# Patient Record
Sex: Male | Born: 1935 | Race: White | Hispanic: No | Marital: Married | Smoking: Former smoker
Health system: Southern US, Community
[De-identification: ages and names within clinical notes are randomized; demographics above are authoritative.]

## PROBLEM LIST (undated history)

## (undated) DIAGNOSIS — F528 Other sexual dysfunction not due to a substance or known physiological condition: Secondary | ICD-10-CM

## (undated) DIAGNOSIS — E039 Hypothyroidism, unspecified: Secondary | ICD-10-CM

## (undated) DIAGNOSIS — K5730 Diverticulosis of large intestine without perforation or abscess without bleeding: Secondary | ICD-10-CM

## (undated) DIAGNOSIS — F329 Major depressive disorder, single episode, unspecified: Secondary | ICD-10-CM

## (undated) DIAGNOSIS — N400 Enlarged prostate without lower urinary tract symptoms: Secondary | ICD-10-CM

## (undated) DIAGNOSIS — M1990 Unspecified osteoarthritis, unspecified site: Secondary | ICD-10-CM

## (undated) DIAGNOSIS — E785 Hyperlipidemia, unspecified: Secondary | ICD-10-CM

## (undated) DIAGNOSIS — Z8679 Personal history of other diseases of the circulatory system: Secondary | ICD-10-CM

## (undated) DIAGNOSIS — H9319 Tinnitus, unspecified ear: Secondary | ICD-10-CM

## (undated) DIAGNOSIS — K219 Gastro-esophageal reflux disease without esophagitis: Secondary | ICD-10-CM

## (undated) DIAGNOSIS — J449 Chronic obstructive pulmonary disease, unspecified: Secondary | ICD-10-CM

## (undated) HISTORY — DX: Other sexual dysfunction not due to a substance or known physiological condition: F52.8

## (undated) HISTORY — DX: Personal history of other diseases of the circulatory system: Z86.79

## (undated) HISTORY — DX: Hyperlipidemia, unspecified: E78.5

## (undated) HISTORY — DX: Hypothyroidism, unspecified: E03.9

## (undated) HISTORY — DX: Diverticulosis of large intestine without perforation or abscess without bleeding: K57.30

## (undated) HISTORY — DX: Benign prostatic hyperplasia without lower urinary tract symptoms: N40.0

## (undated) HISTORY — DX: Tinnitus, unspecified ear: H93.19

## (undated) HISTORY — DX: Gastro-esophageal reflux disease without esophagitis: K21.9

## (undated) HISTORY — DX: Chronic obstructive pulmonary disease, unspecified: J44.9

## (undated) HISTORY — DX: Unspecified osteoarthritis, unspecified site: M19.90

## (undated) HISTORY — DX: Major depressive disorder, single episode, unspecified: F32.9

---

## 2000-04-24 ENCOUNTER — Encounter: Payer: Self-pay | Admitting: Internal Medicine

## 2000-04-24 ENCOUNTER — Encounter: Admission: RE | Admit: 2000-04-24 | Discharge: 2000-04-24 | Payer: Self-pay | Admitting: Internal Medicine

## 2000-05-06 ENCOUNTER — Encounter: Payer: Self-pay | Admitting: Internal Medicine

## 2000-05-06 ENCOUNTER — Encounter: Admission: RE | Admit: 2000-05-06 | Discharge: 2000-05-06 | Payer: Self-pay | Admitting: Internal Medicine

## 2004-03-11 ENCOUNTER — Encounter: Admission: RE | Admit: 2004-03-11 | Discharge: 2004-03-11 | Payer: Self-pay | Admitting: Internal Medicine

## 2005-04-18 ENCOUNTER — Ambulatory Visit (HOSPITAL_BASED_OUTPATIENT_CLINIC_OR_DEPARTMENT_OTHER): Admission: RE | Admit: 2005-04-18 | Discharge: 2005-04-18 | Payer: Self-pay | Admitting: Ophthalmology

## 2005-04-18 ENCOUNTER — Ambulatory Visit (HOSPITAL_COMMUNITY): Admission: RE | Admit: 2005-04-18 | Discharge: 2005-04-18 | Payer: Self-pay | Admitting: Ophthalmology

## 2005-05-06 ENCOUNTER — Ambulatory Visit: Payer: Self-pay | Admitting: Internal Medicine

## 2005-06-19 ENCOUNTER — Ambulatory Visit: Payer: Self-pay | Admitting: Internal Medicine

## 2005-06-19 LAB — CONVERTED CEMR LAB: PSA: 2.63 ng/mL

## 2005-06-25 ENCOUNTER — Ambulatory Visit: Payer: Self-pay | Admitting: Internal Medicine

## 2005-08-01 ENCOUNTER — Ambulatory Visit: Payer: Self-pay | Admitting: Internal Medicine

## 2005-10-23 ENCOUNTER — Ambulatory Visit: Payer: Self-pay | Admitting: Internal Medicine

## 2005-11-28 ENCOUNTER — Ambulatory Visit: Payer: Self-pay | Admitting: Internal Medicine

## 2007-01-09 ENCOUNTER — Encounter: Payer: Self-pay | Admitting: Internal Medicine

## 2007-01-09 DIAGNOSIS — Z8679 Personal history of other diseases of the circulatory system: Secondary | ICD-10-CM | POA: Insufficient documentation

## 2007-01-09 DIAGNOSIS — K5730 Diverticulosis of large intestine without perforation or abscess without bleeding: Secondary | ICD-10-CM | POA: Insufficient documentation

## 2007-01-09 DIAGNOSIS — J4489 Other specified chronic obstructive pulmonary disease: Secondary | ICD-10-CM | POA: Insufficient documentation

## 2007-01-09 DIAGNOSIS — K219 Gastro-esophageal reflux disease without esophagitis: Secondary | ICD-10-CM | POA: Insufficient documentation

## 2007-01-09 DIAGNOSIS — J449 Chronic obstructive pulmonary disease, unspecified: Secondary | ICD-10-CM

## 2007-01-09 DIAGNOSIS — E039 Hypothyroidism, unspecified: Secondary | ICD-10-CM | POA: Insufficient documentation

## 2007-01-09 DIAGNOSIS — H9319 Tinnitus, unspecified ear: Secondary | ICD-10-CM | POA: Insufficient documentation

## 2007-01-09 DIAGNOSIS — F329 Major depressive disorder, single episode, unspecified: Secondary | ICD-10-CM

## 2007-01-09 DIAGNOSIS — E785 Hyperlipidemia, unspecified: Secondary | ICD-10-CM

## 2007-01-09 DIAGNOSIS — M1990 Unspecified osteoarthritis, unspecified site: Secondary | ICD-10-CM

## 2007-01-09 DIAGNOSIS — F528 Other sexual dysfunction not due to a substance or known physiological condition: Secondary | ICD-10-CM

## 2007-01-09 HISTORY — DX: Major depressive disorder, single episode, unspecified: F32.9

## 2007-01-09 HISTORY — DX: Chronic obstructive pulmonary disease, unspecified: J44.9

## 2007-01-09 HISTORY — DX: Diverticulosis of large intestine without perforation or abscess without bleeding: K57.30

## 2007-01-09 HISTORY — DX: Hypothyroidism, unspecified: E03.9

## 2007-01-09 HISTORY — DX: Unspecified osteoarthritis, unspecified site: M19.90

## 2007-01-09 HISTORY — DX: Hyperlipidemia, unspecified: E78.5

## 2007-01-09 HISTORY — DX: Tinnitus, unspecified ear: H93.19

## 2007-01-09 HISTORY — DX: Other sexual dysfunction not due to a substance or known physiological condition: F52.8

## 2007-01-09 HISTORY — DX: Personal history of other diseases of the circulatory system: Z86.79

## 2007-01-09 HISTORY — DX: Gastro-esophageal reflux disease without esophagitis: K21.9

## 2007-01-13 DIAGNOSIS — N400 Enlarged prostate without lower urinary tract symptoms: Secondary | ICD-10-CM

## 2007-01-13 HISTORY — DX: Benign prostatic hyperplasia without lower urinary tract symptoms: N40.0

## 2011-05-13 DIAGNOSIS — I10 Essential (primary) hypertension: Secondary | ICD-10-CM | POA: Diagnosis not present

## 2011-05-13 DIAGNOSIS — E785 Hyperlipidemia, unspecified: Secondary | ICD-10-CM | POA: Diagnosis not present

## 2011-05-27 DIAGNOSIS — G5600 Carpal tunnel syndrome, unspecified upper limb: Secondary | ICD-10-CM | POA: Diagnosis not present

## 2011-05-27 DIAGNOSIS — M5412 Radiculopathy, cervical region: Secondary | ICD-10-CM | POA: Diagnosis not present

## 2011-06-17 DIAGNOSIS — E785 Hyperlipidemia, unspecified: Secondary | ICD-10-CM | POA: Diagnosis not present

## 2011-06-17 DIAGNOSIS — R040 Epistaxis: Secondary | ICD-10-CM | POA: Diagnosis not present

## 2011-06-17 DIAGNOSIS — I10 Essential (primary) hypertension: Secondary | ICD-10-CM | POA: Diagnosis not present

## 2011-06-20 DIAGNOSIS — M5412 Radiculopathy, cervical region: Secondary | ICD-10-CM | POA: Diagnosis not present

## 2011-07-09 DIAGNOSIS — M48061 Spinal stenosis, lumbar region without neurogenic claudication: Secondary | ICD-10-CM | POA: Diagnosis not present

## 2011-07-09 DIAGNOSIS — M5412 Radiculopathy, cervical region: Secondary | ICD-10-CM | POA: Diagnosis not present

## 2011-07-09 DIAGNOSIS — R269 Unspecified abnormalities of gait and mobility: Secondary | ICD-10-CM | POA: Diagnosis not present

## 2011-07-16 DIAGNOSIS — M48061 Spinal stenosis, lumbar region without neurogenic claudication: Secondary | ICD-10-CM | POA: Diagnosis not present

## 2011-07-16 DIAGNOSIS — M4712 Other spondylosis with myelopathy, cervical region: Secondary | ICD-10-CM | POA: Diagnosis not present

## 2011-08-04 ENCOUNTER — Ambulatory Visit: Payer: Medicare Other | Attending: Psychology | Admitting: Physical Therapy

## 2011-08-04 ENCOUNTER — Ambulatory Visit: Payer: Self-pay | Admitting: Physical Therapy

## 2011-08-07 DIAGNOSIS — M4802 Spinal stenosis, cervical region: Secondary | ICD-10-CM | POA: Diagnosis not present

## 2011-08-07 DIAGNOSIS — M899 Disorder of bone, unspecified: Secondary | ICD-10-CM | POA: Diagnosis not present

## 2011-08-07 DIAGNOSIS — Q619 Cystic kidney disease, unspecified: Secondary | ICD-10-CM | POA: Diagnosis not present

## 2011-08-07 DIAGNOSIS — M2550 Pain in unspecified joint: Secondary | ICD-10-CM | POA: Diagnosis not present

## 2011-08-07 DIAGNOSIS — M5030 Other cervical disc degeneration, unspecified cervical region: Secondary | ICD-10-CM | POA: Diagnosis not present

## 2011-08-08 ENCOUNTER — Ambulatory Visit: Payer: Self-pay | Admitting: Physical Therapy

## 2011-08-13 DIAGNOSIS — Q619 Cystic kidney disease, unspecified: Secondary | ICD-10-CM | POA: Diagnosis not present

## 2011-08-19 DIAGNOSIS — E039 Hypothyroidism, unspecified: Secondary | ICD-10-CM | POA: Diagnosis not present

## 2011-08-19 DIAGNOSIS — E785 Hyperlipidemia, unspecified: Secondary | ICD-10-CM | POA: Diagnosis not present

## 2011-08-19 DIAGNOSIS — I10 Essential (primary) hypertension: Secondary | ICD-10-CM | POA: Diagnosis not present

## 2011-08-19 DIAGNOSIS — Z125 Encounter for screening for malignant neoplasm of prostate: Secondary | ICD-10-CM | POA: Diagnosis not present

## 2011-08-20 DIAGNOSIS — H26499 Other secondary cataract, unspecified eye: Secondary | ICD-10-CM | POA: Diagnosis not present

## 2011-08-26 DIAGNOSIS — Z0000 Encounter for general adult medical examination without abnormal findings: Secondary | ICD-10-CM | POA: Diagnosis not present

## 2011-08-26 DIAGNOSIS — E785 Hyperlipidemia, unspecified: Secondary | ICD-10-CM | POA: Diagnosis not present

## 2011-08-26 DIAGNOSIS — I10 Essential (primary) hypertension: Secondary | ICD-10-CM | POA: Diagnosis not present

## 2011-08-26 DIAGNOSIS — Z1212 Encounter for screening for malignant neoplasm of rectum: Secondary | ICD-10-CM | POA: Diagnosis not present

## 2011-08-26 DIAGNOSIS — E039 Hypothyroidism, unspecified: Secondary | ICD-10-CM | POA: Diagnosis not present

## 2011-08-26 DIAGNOSIS — Z125 Encounter for screening for malignant neoplasm of prostate: Secondary | ICD-10-CM | POA: Diagnosis not present

## 2011-09-16 ENCOUNTER — Ambulatory Visit: Payer: Medicare Other | Attending: Internal Medicine | Admitting: Rehabilitative and Restorative Service Providers"

## 2011-09-16 DIAGNOSIS — R269 Unspecified abnormalities of gait and mobility: Secondary | ICD-10-CM | POA: Insufficient documentation

## 2011-09-16 DIAGNOSIS — IMO0001 Reserved for inherently not codable concepts without codable children: Secondary | ICD-10-CM | POA: Insufficient documentation

## 2011-09-16 DIAGNOSIS — M25559 Pain in unspecified hip: Secondary | ICD-10-CM | POA: Insufficient documentation

## 2011-09-16 DIAGNOSIS — M79609 Pain in unspecified limb: Secondary | ICD-10-CM | POA: Diagnosis not present

## 2011-09-22 ENCOUNTER — Encounter: Payer: No Typology Code available for payment source | Admitting: Rehabilitative and Restorative Service Providers"

## 2011-09-23 ENCOUNTER — Encounter: Payer: No Typology Code available for payment source | Admitting: Rehabilitative and Restorative Service Providers"

## 2011-09-24 DIAGNOSIS — H26499 Other secondary cataract, unspecified eye: Secondary | ICD-10-CM | POA: Diagnosis not present

## 2011-09-24 DIAGNOSIS — H5210 Myopia, unspecified eye: Secondary | ICD-10-CM | POA: Diagnosis not present

## 2011-09-24 DIAGNOSIS — H52209 Unspecified astigmatism, unspecified eye: Secondary | ICD-10-CM | POA: Diagnosis not present

## 2011-09-26 ENCOUNTER — Encounter: Payer: No Typology Code available for payment source | Admitting: Rehabilitative and Restorative Service Providers"

## 2011-10-01 ENCOUNTER — Ambulatory Visit: Payer: Medicare Other | Admitting: Rehabilitative and Restorative Service Providers"

## 2011-10-03 ENCOUNTER — Ambulatory Visit: Payer: Medicare Other | Admitting: Rehabilitative and Restorative Service Providers"

## 2011-10-07 ENCOUNTER — Ambulatory Visit: Payer: Medicare Other | Attending: Internal Medicine | Admitting: Rehabilitative and Restorative Service Providers"

## 2011-10-07 DIAGNOSIS — IMO0001 Reserved for inherently not codable concepts without codable children: Secondary | ICD-10-CM | POA: Diagnosis not present

## 2011-10-07 DIAGNOSIS — R269 Unspecified abnormalities of gait and mobility: Secondary | ICD-10-CM | POA: Diagnosis not present

## 2011-10-07 DIAGNOSIS — M25559 Pain in unspecified hip: Secondary | ICD-10-CM | POA: Insufficient documentation

## 2011-10-07 DIAGNOSIS — M79609 Pain in unspecified limb: Secondary | ICD-10-CM | POA: Insufficient documentation

## 2011-10-09 ENCOUNTER — Ambulatory Visit: Payer: Medicare Other | Admitting: Rehabilitative and Restorative Service Providers"

## 2011-10-14 ENCOUNTER — Ambulatory Visit: Payer: Medicare Other | Admitting: Rehabilitative and Restorative Service Providers"

## 2011-10-17 ENCOUNTER — Encounter: Payer: No Typology Code available for payment source | Admitting: Rehabilitative and Restorative Service Providers"

## 2011-10-20 ENCOUNTER — Other Ambulatory Visit: Payer: Self-pay | Admitting: Internal Medicine

## 2011-10-20 DIAGNOSIS — R935 Abnormal findings on diagnostic imaging of other abdominal regions, including retroperitoneum: Secondary | ICD-10-CM

## 2011-10-24 ENCOUNTER — Ambulatory Visit: Payer: Medicare Other | Admitting: Rehabilitative and Restorative Service Providers"

## 2011-10-28 ENCOUNTER — Ambulatory Visit: Payer: Medicare Other | Admitting: Occupational Therapy

## 2011-11-10 ENCOUNTER — Encounter: Payer: No Typology Code available for payment source | Admitting: Occupational Therapy

## 2011-11-12 ENCOUNTER — Encounter: Payer: No Typology Code available for payment source | Admitting: Occupational Therapy

## 2011-11-19 DIAGNOSIS — R209 Unspecified disturbances of skin sensation: Secondary | ICD-10-CM | POA: Diagnosis not present

## 2011-11-19 DIAGNOSIS — I10 Essential (primary) hypertension: Secondary | ICD-10-CM | POA: Diagnosis not present

## 2011-12-03 DIAGNOSIS — H269 Unspecified cataract: Secondary | ICD-10-CM | POA: Diagnosis not present

## 2011-12-03 DIAGNOSIS — F329 Major depressive disorder, single episode, unspecified: Secondary | ICD-10-CM | POA: Diagnosis not present

## 2011-12-03 DIAGNOSIS — I10 Essential (primary) hypertension: Secondary | ICD-10-CM | POA: Diagnosis not present

## 2011-12-03 DIAGNOSIS — E039 Hypothyroidism, unspecified: Secondary | ICD-10-CM | POA: Diagnosis not present

## 2011-12-03 DIAGNOSIS — K219 Gastro-esophageal reflux disease without esophagitis: Secondary | ICD-10-CM | POA: Diagnosis not present

## 2011-12-03 DIAGNOSIS — E785 Hyperlipidemia, unspecified: Secondary | ICD-10-CM | POA: Diagnosis not present

## 2011-12-03 DIAGNOSIS — R209 Unspecified disturbances of skin sensation: Secondary | ICD-10-CM | POA: Diagnosis not present

## 2011-12-24 ENCOUNTER — Ambulatory Visit: Payer: Medicare Other | Attending: Internal Medicine | Admitting: Rehabilitative and Restorative Service Providers"

## 2011-12-24 DIAGNOSIS — IMO0001 Reserved for inherently not codable concepts without codable children: Secondary | ICD-10-CM | POA: Diagnosis not present

## 2011-12-24 DIAGNOSIS — M79609 Pain in unspecified limb: Secondary | ICD-10-CM | POA: Insufficient documentation

## 2011-12-24 DIAGNOSIS — M25559 Pain in unspecified hip: Secondary | ICD-10-CM | POA: Diagnosis not present

## 2011-12-24 DIAGNOSIS — R269 Unspecified abnormalities of gait and mobility: Secondary | ICD-10-CM | POA: Diagnosis not present

## 2011-12-31 ENCOUNTER — Ambulatory Visit: Payer: Medicare Other | Admitting: Rehabilitation

## 2012-01-07 ENCOUNTER — Ambulatory Visit: Payer: Medicare Other | Attending: Internal Medicine | Admitting: Rehabilitative and Restorative Service Providers"

## 2012-01-07 DIAGNOSIS — M542 Cervicalgia: Secondary | ICD-10-CM | POA: Diagnosis not present

## 2012-01-07 DIAGNOSIS — IMO0001 Reserved for inherently not codable concepts without codable children: Secondary | ICD-10-CM | POA: Diagnosis not present

## 2012-01-07 DIAGNOSIS — M2569 Stiffness of other specified joint, not elsewhere classified: Secondary | ICD-10-CM | POA: Insufficient documentation

## 2012-01-07 DIAGNOSIS — R293 Abnormal posture: Secondary | ICD-10-CM | POA: Insufficient documentation

## 2012-01-08 ENCOUNTER — Ambulatory Visit: Payer: Medicare Other | Admitting: Rehabilitative and Restorative Service Providers"

## 2012-01-08 DIAGNOSIS — IMO0001 Reserved for inherently not codable concepts without codable children: Secondary | ICD-10-CM | POA: Diagnosis not present

## 2012-01-08 DIAGNOSIS — M542 Cervicalgia: Secondary | ICD-10-CM | POA: Diagnosis not present

## 2012-01-08 DIAGNOSIS — R293 Abnormal posture: Secondary | ICD-10-CM | POA: Diagnosis not present

## 2012-01-13 ENCOUNTER — Ambulatory Visit: Payer: Medicare Other | Admitting: Physical Therapy

## 2012-01-13 DIAGNOSIS — IMO0001 Reserved for inherently not codable concepts without codable children: Secondary | ICD-10-CM | POA: Diagnosis not present

## 2012-01-13 DIAGNOSIS — M542 Cervicalgia: Secondary | ICD-10-CM | POA: Diagnosis not present

## 2012-01-13 DIAGNOSIS — R293 Abnormal posture: Secondary | ICD-10-CM | POA: Diagnosis not present

## 2012-01-15 ENCOUNTER — Ambulatory Visit: Payer: Medicare Other | Admitting: Rehabilitation

## 2012-01-15 DIAGNOSIS — R293 Abnormal posture: Secondary | ICD-10-CM | POA: Diagnosis not present

## 2012-01-15 DIAGNOSIS — M542 Cervicalgia: Secondary | ICD-10-CM | POA: Diagnosis not present

## 2012-01-15 DIAGNOSIS — IMO0001 Reserved for inherently not codable concepts without codable children: Secondary | ICD-10-CM | POA: Diagnosis not present

## 2012-01-20 ENCOUNTER — Ambulatory Visit: Payer: Medicare Other | Admitting: Rehabilitation

## 2012-01-20 DIAGNOSIS — M542 Cervicalgia: Secondary | ICD-10-CM | POA: Diagnosis not present

## 2012-01-20 DIAGNOSIS — IMO0001 Reserved for inherently not codable concepts without codable children: Secondary | ICD-10-CM | POA: Diagnosis not present

## 2012-01-20 DIAGNOSIS — R293 Abnormal posture: Secondary | ICD-10-CM | POA: Diagnosis not present

## 2012-01-22 ENCOUNTER — Ambulatory Visit: Payer: Medicare Other

## 2012-01-22 DIAGNOSIS — R293 Abnormal posture: Secondary | ICD-10-CM | POA: Diagnosis not present

## 2012-01-22 DIAGNOSIS — IMO0001 Reserved for inherently not codable concepts without codable children: Secondary | ICD-10-CM | POA: Diagnosis not present

## 2012-01-22 DIAGNOSIS — M542 Cervicalgia: Secondary | ICD-10-CM | POA: Diagnosis not present

## 2012-02-02 ENCOUNTER — Ambulatory Visit: Payer: Medicare Other | Admitting: Rehabilitative and Restorative Service Providers"

## 2012-02-02 DIAGNOSIS — M2569 Stiffness of other specified joint, not elsewhere classified: Secondary | ICD-10-CM | POA: Diagnosis not present

## 2012-02-02 DIAGNOSIS — IMO0001 Reserved for inherently not codable concepts without codable children: Secondary | ICD-10-CM | POA: Diagnosis not present

## 2012-02-02 DIAGNOSIS — R293 Abnormal posture: Secondary | ICD-10-CM | POA: Diagnosis not present

## 2012-02-02 DIAGNOSIS — M542 Cervicalgia: Secondary | ICD-10-CM | POA: Diagnosis not present

## 2012-02-04 ENCOUNTER — Encounter: Payer: No Typology Code available for payment source | Admitting: Rehabilitation

## 2012-02-05 DIAGNOSIS — G5600 Carpal tunnel syndrome, unspecified upper limb: Secondary | ICD-10-CM | POA: Diagnosis not present

## 2012-02-17 DIAGNOSIS — M5412 Radiculopathy, cervical region: Secondary | ICD-10-CM | POA: Diagnosis not present

## 2012-02-17 DIAGNOSIS — G5600 Carpal tunnel syndrome, unspecified upper limb: Secondary | ICD-10-CM | POA: Diagnosis not present

## 2012-03-03 DIAGNOSIS — I10 Essential (primary) hypertension: Secondary | ICD-10-CM | POA: Diagnosis not present

## 2012-06-18 DIAGNOSIS — Z961 Presence of intraocular lens: Secondary | ICD-10-CM | POA: Diagnosis not present

## 2012-06-18 DIAGNOSIS — H40059 Ocular hypertension, unspecified eye: Secondary | ICD-10-CM | POA: Diagnosis not present

## 2012-06-18 DIAGNOSIS — H53009 Unspecified amblyopia, unspecified eye: Secondary | ICD-10-CM | POA: Diagnosis not present

## 2012-06-18 DIAGNOSIS — H52209 Unspecified astigmatism, unspecified eye: Secondary | ICD-10-CM | POA: Diagnosis not present

## 2012-08-02 DIAGNOSIS — H4011X0 Primary open-angle glaucoma, stage unspecified: Secondary | ICD-10-CM | POA: Diagnosis not present

## 2012-08-02 DIAGNOSIS — H409 Unspecified glaucoma: Secondary | ICD-10-CM | POA: Diagnosis not present

## 2012-08-02 DIAGNOSIS — H4720 Unspecified optic atrophy: Secondary | ICD-10-CM | POA: Diagnosis not present

## 2012-10-28 ENCOUNTER — Ambulatory Visit: Payer: Medicare Other | Attending: *Deleted | Admitting: Physical Therapy

## 2012-10-28 DIAGNOSIS — IMO0001 Reserved for inherently not codable concepts without codable children: Secondary | ICD-10-CM | POA: Insufficient documentation

## 2012-10-28 DIAGNOSIS — M25559 Pain in unspecified hip: Secondary | ICD-10-CM | POA: Diagnosis not present

## 2012-11-08 DIAGNOSIS — H35349 Macular cyst, hole, or pseudohole, unspecified eye: Secondary | ICD-10-CM | POA: Diagnosis not present

## 2012-11-08 DIAGNOSIS — H4011X0 Primary open-angle glaucoma, stage unspecified: Secondary | ICD-10-CM | POA: Diagnosis not present

## 2012-11-08 DIAGNOSIS — H4720 Unspecified optic atrophy: Secondary | ICD-10-CM | POA: Diagnosis not present

## 2012-11-08 DIAGNOSIS — H409 Unspecified glaucoma: Secondary | ICD-10-CM | POA: Diagnosis not present

## 2012-11-08 DIAGNOSIS — H53419 Scotoma involving central area, unspecified eye: Secondary | ICD-10-CM | POA: Diagnosis not present

## 2012-11-10 ENCOUNTER — Ambulatory Visit: Payer: Non-veteran care | Attending: *Deleted | Admitting: Physical Therapy

## 2012-11-10 DIAGNOSIS — M25559 Pain in unspecified hip: Secondary | ICD-10-CM | POA: Diagnosis not present

## 2012-11-10 DIAGNOSIS — IMO0001 Reserved for inherently not codable concepts without codable children: Secondary | ICD-10-CM | POA: Diagnosis not present

## 2012-11-12 ENCOUNTER — Ambulatory Visit: Payer: Non-veteran care | Admitting: Physical Therapy

## 2012-11-12 DIAGNOSIS — IMO0001 Reserved for inherently not codable concepts without codable children: Secondary | ICD-10-CM | POA: Diagnosis not present

## 2012-11-15 ENCOUNTER — Ambulatory Visit: Payer: Non-veteran care | Admitting: Physical Therapy

## 2012-11-15 DIAGNOSIS — IMO0001 Reserved for inherently not codable concepts without codable children: Secondary | ICD-10-CM | POA: Diagnosis not present

## 2012-11-18 ENCOUNTER — Ambulatory Visit: Payer: Non-veteran care | Admitting: Physical Therapy

## 2012-11-18 DIAGNOSIS — IMO0001 Reserved for inherently not codable concepts without codable children: Secondary | ICD-10-CM | POA: Diagnosis not present

## 2012-11-24 ENCOUNTER — Ambulatory Visit: Payer: Non-veteran care | Admitting: Physical Therapy

## 2012-11-30 ENCOUNTER — Ambulatory Visit: Payer: Non-veteran care | Admitting: Physical Therapy

## 2012-11-30 DIAGNOSIS — IMO0001 Reserved for inherently not codable concepts without codable children: Secondary | ICD-10-CM | POA: Diagnosis not present

## 2012-12-02 ENCOUNTER — Ambulatory Visit: Payer: Non-veteran care | Admitting: Physical Therapy

## 2012-12-03 DIAGNOSIS — H4720 Unspecified optic atrophy: Secondary | ICD-10-CM | POA: Diagnosis not present

## 2012-12-06 DIAGNOSIS — H35349 Macular cyst, hole, or pseudohole, unspecified eye: Secondary | ICD-10-CM | POA: Diagnosis not present

## 2012-12-06 DIAGNOSIS — H4720 Unspecified optic atrophy: Secondary | ICD-10-CM | POA: Diagnosis not present

## 2012-12-15 ENCOUNTER — Ambulatory Visit: Payer: Medicare Other | Attending: *Deleted | Admitting: Physical Therapy

## 2012-12-15 DIAGNOSIS — M25559 Pain in unspecified hip: Secondary | ICD-10-CM | POA: Insufficient documentation

## 2012-12-15 DIAGNOSIS — IMO0001 Reserved for inherently not codable concepts without codable children: Secondary | ICD-10-CM | POA: Insufficient documentation

## 2013-02-11 DIAGNOSIS — F329 Major depressive disorder, single episode, unspecified: Secondary | ICD-10-CM | POA: Diagnosis not present

## 2013-02-11 DIAGNOSIS — Z0000 Encounter for general adult medical examination without abnormal findings: Secondary | ICD-10-CM | POA: Diagnosis not present

## 2013-02-11 DIAGNOSIS — M899 Disorder of bone, unspecified: Secondary | ICD-10-CM | POA: Diagnosis not present

## 2013-02-11 DIAGNOSIS — M25559 Pain in unspecified hip: Secondary | ICD-10-CM | POA: Diagnosis not present

## 2013-02-11 DIAGNOSIS — Z1331 Encounter for screening for depression: Secondary | ICD-10-CM | POA: Diagnosis not present

## 2013-02-11 DIAGNOSIS — E039 Hypothyroidism, unspecified: Secondary | ICD-10-CM | POA: Diagnosis not present

## 2013-02-11 DIAGNOSIS — I10 Essential (primary) hypertension: Secondary | ICD-10-CM | POA: Diagnosis not present

## 2013-02-11 DIAGNOSIS — K219 Gastro-esophageal reflux disease without esophagitis: Secondary | ICD-10-CM | POA: Diagnosis not present

## 2013-02-21 ENCOUNTER — Ambulatory Visit: Payer: Non-veteran care | Admitting: Cardiology

## 2013-03-07 ENCOUNTER — Encounter: Payer: Self-pay | Admitting: *Deleted

## 2013-03-09 ENCOUNTER — Encounter: Payer: Self-pay | Admitting: Cardiology

## 2013-03-09 ENCOUNTER — Ambulatory Visit (INDEPENDENT_AMBULATORY_CARE_PROVIDER_SITE_OTHER): Payer: Medicare Other | Admitting: Cardiology

## 2013-03-09 VITALS — BP 142/84 | HR 57 | Ht 69.0 in | Wt 166.0 lb

## 2013-03-09 DIAGNOSIS — R002 Palpitations: Secondary | ICD-10-CM | POA: Diagnosis not present

## 2013-03-09 NOTE — Progress Notes (Signed)
Patient ID: KIETH HARTIS, male   DOB: January 21, 1934, 79 y.o.   MRN: [id number]    Patient Name: Peter Velasquez Date of Encounter: 03/09/2013  Primary Care Provider:  No primary provider on file. Primary Cardiologist:  Tobias Alexander, H  Patient Profile  Abnormal ECG  Problem List   Past Medical History  Diagnosis Date  . HYPERLIPIDEMIA 01/09/2007    Qualifier: Diagnosis of  By: Maris Berger   . BENIGN PROSTATIC HYPERTROPHY 01/13/2007    Qualifier: Diagnosis of  By: Jonny Ruiz MD, Len Blalock   . COPD 01/09/2007    Qualifier: Diagnosis of  By: Maris Berger   . DEGENERATIVE JOINT DISEASE 01/09/2007    Qualifier: Diagnosis of  By: Maris Berger   . DEPRESSION 01/09/2007    Qualifier: Diagnosis of  By: Maris Berger   . DIVERTICULOSIS, COLON 01/09/2007    Qualifier: Diagnosis of  By: Maris Berger   . ERECTILE DYSFUNCTION 01/09/2007    Qualifier: Diagnosis of  By: Maris Berger   . GERD 01/09/2007    Qualifier: Diagnosis of  By: Maris Berger   . HYPOTHYROIDISM 01/09/2007    Qualifier: Diagnosis of  By: Maris Berger   . PALPITATIONS, HX OF 01/09/2007    Qualifier: Diagnosis of  By: Maris Berger   . TINNITUS 01/09/2007    Qualifier: Diagnosis of  By: Maris Berger    History reviewed. No pertinent past surgical history.  Allergies  No Known Allergies  HPI  A very pleasant 79 year old male, formerly in [employer], with h/o HTN, hyperlipidemia, who was referred by his PCP for a concern of abnormal ECG. The patient is physically active, he used to walk his dog for about a mile without any limitations. He denies any chest pain, SOB, palpitations or syncope. He does have a family h/o CAD, his brother had 2 stents placed 5 years ago.   Home Medications  Prior to Admission medications   Medication Sig Start Date End Date Taking? Authorizing Provider  atenolol (TENORMIN) 50 MG tablet Take 50 mg by mouth daily.    Yes Historical Provider, MD  benazepril (LOTENSIN) 20 MG tablet Take 20 mg by mouth daily.   Yes Historical Provider, MD  citalopram (CELEXA) 20 MG tablet Take 20 mg by mouth daily.   Yes Historical Provider, MD  levothyroxine (SYNTHROID, LEVOTHROID) 137 MCG tablet Take 137 mcg by mouth daily before breakfast.   Yes Historical Provider, MD  omeprazole (PRILOSEC) 20 MG capsule Take 20 mg by mouth daily.   Yes Historical Provider, MD  simvastatin (ZOCOR) 40 MG tablet Take 40 mg by mouth daily.   Yes Historical Provider, MD    Family History  Family History  Problem Relation Age of Onset  . Stroke Father   . Other Sister   . Hypertension Father     Social History  History   Social History  . Marital Status: Married    Spouse Name: N/A    Number of Children: N/A  . Years of Education: N/A   Occupational History  . Not on file.   Social History Main Topics  . Smoking status: Former Smoker    Quit date: 01/23/1998  . Smokeless tobacco: Not on file  . Alcohol Use: No  . Drug Use: No  . Sexual Activity: Not on file   Other Topics Concern  . Not on file   Social History Narrative  . No narrative on  file     Review of Systems, as per HPI, otherwise negative General:  No chills, fever, night sweats or weight changes.  Cardiovascular:  No chest pain, dyspnea on exertion, edema, orthopnea, palpitations, paroxysmal nocturnal dyspnea. Dermatological: No rash, lesions/masses Respiratory: No cough, dyspnea Urologic: No hematuria, dysuria Abdominal:   No nausea, vomiting, diarrhea, bright red blood per rectum, melena, or hematemesis Neurologic:  No visual changes, wkns, changes in mental status. All other systems reviewed and are otherwise negative except as noted above.  Physical Exam  Blood pressure 142/84, pulse 57, height 5\' 9"  (1.753 m), weight 166 lb (75.297 kg).  General: Pleasant, NAD Psych: Normal affect. Neuro: Alert and oriented X 3. Moves all extremities  spontaneously. HEENT: Normal  Neck: Supple without bruits or JVD. Lungs:  Resp regular and unlabored, CTA. Heart: RRR no s3, s4, or murmurs. Abdomen: Soft, non-tender, non-distended, BS + x 4.  Extremities: No clubbing, cyanosis or edema. DP/PT/Radials 2+ and equal bilaterally.  Accessory Clinical Findings  ECG - sinus bradycardia, 67 beats per minute, otherwise normal EKG.  Assessment & Plan  79 year old gentleman referred to [place] for an abnormal ECG. The ECG in fact is completely normal. The patient is active and asymptomatic. He has risk factors that are well controlled by his PCP, icluding HTN and hyperlipidemia. Physical exam is normal. Today's BP was borderline, however the patient states that it is almost always normal when checked at home. We won't do anyt changes. There is no reason to proceed with any further workup at this point..   Thank you for referring this patient to [place]. The patient was advised to contact us if any new symptoms occur.    Tobias Alexander, Rexene Edison, MD 03/09/2013, 3:28 PM

## 2013-03-09 NOTE — Patient Instructions (Signed)
**Note De-identified Timia Casselman Obfuscation** Your physician recommends that you continue on your current medications as directed. Please refer to the Current Medication list given to you today.  Your physician recommends that you schedule a follow-up appointment in: as needed  

## 2013-03-10 DIAGNOSIS — Z23 Encounter for immunization: Secondary | ICD-10-CM | POA: Diagnosis not present

## 2013-03-30 DIAGNOSIS — IMO0002 Reserved for concepts with insufficient information to code with codable children: Secondary | ICD-10-CM | POA: Diagnosis not present

## 2013-03-30 DIAGNOSIS — S335XXA Sprain of ligaments of lumbar spine, initial encounter: Secondary | ICD-10-CM | POA: Diagnosis not present

## 2013-05-19 DIAGNOSIS — IMO0002 Reserved for concepts with insufficient information to code with codable children: Secondary | ICD-10-CM | POA: Diagnosis not present

## 2013-07-13 DIAGNOSIS — G5600 Carpal tunnel syndrome, unspecified upper limb: Secondary | ICD-10-CM | POA: Diagnosis not present

## 2013-07-13 DIAGNOSIS — IMO0002 Reserved for concepts with insufficient information to code with codable children: Secondary | ICD-10-CM | POA: Diagnosis not present

## 2013-07-13 DIAGNOSIS — M19049 Primary osteoarthritis, unspecified hand: Secondary | ICD-10-CM | POA: Diagnosis not present

## 2013-07-13 DIAGNOSIS — M79609 Pain in unspecified limb: Secondary | ICD-10-CM | POA: Diagnosis not present

## 2013-07-23 DIAGNOSIS — IMO0002 Reserved for concepts with insufficient information to code with codable children: Secondary | ICD-10-CM | POA: Diagnosis not present

## 2013-07-27 DIAGNOSIS — M76899 Other specified enthesopathies of unspecified lower limb, excluding foot: Secondary | ICD-10-CM | POA: Diagnosis not present

## 2013-07-29 DIAGNOSIS — M76899 Other specified enthesopathies of unspecified lower limb, excluding foot: Secondary | ICD-10-CM | POA: Diagnosis not present

## 2013-07-30 DIAGNOSIS — M25539 Pain in unspecified wrist: Secondary | ICD-10-CM | POA: Diagnosis not present

## 2013-08-12 DIAGNOSIS — F329 Major depressive disorder, single episode, unspecified: Secondary | ICD-10-CM | POA: Diagnosis not present

## 2013-08-12 DIAGNOSIS — K219 Gastro-esophageal reflux disease without esophagitis: Secondary | ICD-10-CM | POA: Diagnosis not present

## 2013-08-12 DIAGNOSIS — N182 Chronic kidney disease, stage 2 (mild): Secondary | ICD-10-CM | POA: Diagnosis not present

## 2013-08-12 DIAGNOSIS — I10 Essential (primary) hypertension: Secondary | ICD-10-CM | POA: Diagnosis not present

## 2013-08-12 DIAGNOSIS — E782 Mixed hyperlipidemia: Secondary | ICD-10-CM | POA: Diagnosis not present

## 2013-08-12 DIAGNOSIS — E039 Hypothyroidism, unspecified: Secondary | ICD-10-CM | POA: Diagnosis not present

## 2013-08-19 DIAGNOSIS — M25559 Pain in unspecified hip: Secondary | ICD-10-CM | POA: Diagnosis not present

## 2013-08-19 DIAGNOSIS — M76899 Other specified enthesopathies of unspecified lower limb, excluding foot: Secondary | ICD-10-CM | POA: Diagnosis not present

## 2013-09-12 DIAGNOSIS — M6580 Other synovitis and tenosynovitis, unspecified site: Secondary | ICD-10-CM | POA: Diagnosis not present

## 2013-09-12 DIAGNOSIS — G5600 Carpal tunnel syndrome, unspecified upper limb: Secondary | ICD-10-CM | POA: Diagnosis not present

## 2013-09-12 DIAGNOSIS — M19049 Primary osteoarthritis, unspecified hand: Secondary | ICD-10-CM | POA: Diagnosis not present

## 2013-09-12 DIAGNOSIS — M79609 Pain in unspecified limb: Secondary | ICD-10-CM | POA: Diagnosis not present

## 2013-10-03 DIAGNOSIS — M48061 Spinal stenosis, lumbar region without neurogenic claudication: Secondary | ICD-10-CM | POA: Diagnosis not present

## 2013-10-03 DIAGNOSIS — IMO0002 Reserved for concepts with insufficient information to code with codable children: Secondary | ICD-10-CM | POA: Diagnosis not present

## 2013-10-24 DIAGNOSIS — M1990 Unspecified osteoarthritis, unspecified site: Secondary | ICD-10-CM | POA: Diagnosis not present

## 2013-10-24 DIAGNOSIS — M79609 Pain in unspecified limb: Secondary | ICD-10-CM | POA: Diagnosis not present

## 2013-10-24 DIAGNOSIS — IMO0002 Reserved for concepts with insufficient information to code with codable children: Secondary | ICD-10-CM | POA: Diagnosis not present

## 2013-10-24 DIAGNOSIS — M48061 Spinal stenosis, lumbar region without neurogenic claudication: Secondary | ICD-10-CM | POA: Diagnosis not present

## 2013-11-02 DIAGNOSIS — M545 Low back pain, unspecified: Secondary | ICD-10-CM | POA: Diagnosis not present

## 2013-11-15 DIAGNOSIS — IMO0002 Reserved for concepts with insufficient information to code with codable children: Secondary | ICD-10-CM | POA: Diagnosis not present

## 2013-11-15 DIAGNOSIS — M48061 Spinal stenosis, lumbar region without neurogenic claudication: Secondary | ICD-10-CM | POA: Diagnosis not present

## 2013-12-13 DIAGNOSIS — IMO0002 Reserved for concepts with insufficient information to code with codable children: Secondary | ICD-10-CM | POA: Diagnosis not present

## 2013-12-26 DIAGNOSIS — M48061 Spinal stenosis, lumbar region without neurogenic claudication: Secondary | ICD-10-CM | POA: Diagnosis not present

## 2013-12-26 DIAGNOSIS — IMO0002 Reserved for concepts with insufficient information to code with codable children: Secondary | ICD-10-CM | POA: Diagnosis not present

## 2014-02-06 DIAGNOSIS — M1812 Unilateral primary osteoarthritis of first carpometacarpal joint, left hand: Secondary | ICD-10-CM | POA: Diagnosis not present

## 2014-02-06 DIAGNOSIS — G5602 Carpal tunnel syndrome, left upper limb: Secondary | ICD-10-CM | POA: Diagnosis not present

## 2014-02-15 DIAGNOSIS — N400 Enlarged prostate without lower urinary tract symptoms: Secondary | ICD-10-CM | POA: Diagnosis not present

## 2014-02-15 DIAGNOSIS — K219 Gastro-esophageal reflux disease without esophagitis: Secondary | ICD-10-CM | POA: Diagnosis not present

## 2014-02-15 DIAGNOSIS — E039 Hypothyroidism, unspecified: Secondary | ICD-10-CM | POA: Diagnosis not present

## 2014-02-15 DIAGNOSIS — E780 Pure hypercholesterolemia: Secondary | ICD-10-CM | POA: Diagnosis not present

## 2014-02-15 DIAGNOSIS — N183 Chronic kidney disease, stage 3 (moderate): Secondary | ICD-10-CM | POA: Diagnosis not present

## 2014-02-15 DIAGNOSIS — Z1389 Encounter for screening for other disorder: Secondary | ICD-10-CM | POA: Diagnosis not present

## 2014-02-15 DIAGNOSIS — Z23 Encounter for immunization: Secondary | ICD-10-CM | POA: Diagnosis not present

## 2014-02-15 DIAGNOSIS — F3342 Major depressive disorder, recurrent, in full remission: Secondary | ICD-10-CM | POA: Diagnosis not present

## 2014-02-15 DIAGNOSIS — Z0001 Encounter for general adult medical examination with abnormal findings: Secondary | ICD-10-CM | POA: Diagnosis not present

## 2014-02-15 DIAGNOSIS — I10 Essential (primary) hypertension: Secondary | ICD-10-CM | POA: Diagnosis not present

## 2014-02-15 DIAGNOSIS — K580 Irritable bowel syndrome with diarrhea: Secondary | ICD-10-CM | POA: Diagnosis not present

## 2014-03-20 DIAGNOSIS — G5602 Carpal tunnel syndrome, left upper limb: Secondary | ICD-10-CM | POA: Diagnosis not present

## 2014-03-22 DIAGNOSIS — E039 Hypothyroidism, unspecified: Secondary | ICD-10-CM | POA: Diagnosis not present

## 2014-04-10 DIAGNOSIS — Z1211 Encounter for screening for malignant neoplasm of colon: Secondary | ICD-10-CM | POA: Diagnosis not present

## 2014-04-11 DIAGNOSIS — I10 Essential (primary) hypertension: Secondary | ICD-10-CM | POA: Diagnosis not present

## 2014-04-11 DIAGNOSIS — H409 Unspecified glaucoma: Secondary | ICD-10-CM | POA: Diagnosis not present

## 2014-04-11 DIAGNOSIS — G5602 Carpal tunnel syndrome, left upper limb: Secondary | ICD-10-CM | POA: Diagnosis not present

## 2014-04-11 DIAGNOSIS — G5601 Carpal tunnel syndrome, right upper limb: Secondary | ICD-10-CM | POA: Diagnosis not present

## 2014-04-11 DIAGNOSIS — M1990 Unspecified osteoarthritis, unspecified site: Secondary | ICD-10-CM | POA: Diagnosis not present

## 2014-04-26 DIAGNOSIS — Z48811 Encounter for surgical aftercare following surgery on the nervous system: Secondary | ICD-10-CM | POA: Diagnosis not present

## 2014-06-26 DIAGNOSIS — G5602 Carpal tunnel syndrome, left upper limb: Secondary | ICD-10-CM | POA: Diagnosis not present

## 2014-06-26 DIAGNOSIS — Z48811 Encounter for surgical aftercare following surgery on the nervous system: Secondary | ICD-10-CM | POA: Diagnosis not present

## 2014-06-26 DIAGNOSIS — M1812 Unilateral primary osteoarthritis of first carpometacarpal joint, left hand: Secondary | ICD-10-CM | POA: Diagnosis not present

## 2014-08-21 DIAGNOSIS — J449 Chronic obstructive pulmonary disease, unspecified: Secondary | ICD-10-CM | POA: Diagnosis not present

## 2014-08-21 DIAGNOSIS — E039 Hypothyroidism, unspecified: Secondary | ICD-10-CM | POA: Diagnosis not present

## 2014-08-21 DIAGNOSIS — E780 Pure hypercholesterolemia: Secondary | ICD-10-CM | POA: Diagnosis not present

## 2014-08-21 DIAGNOSIS — F3342 Major depressive disorder, recurrent, in full remission: Secondary | ICD-10-CM | POA: Diagnosis not present

## 2014-08-21 DIAGNOSIS — R202 Paresthesia of skin: Secondary | ICD-10-CM | POA: Diagnosis not present

## 2014-08-21 DIAGNOSIS — N183 Chronic kidney disease, stage 3 (moderate): Secondary | ICD-10-CM | POA: Diagnosis not present

## 2014-08-21 DIAGNOSIS — I10 Essential (primary) hypertension: Secondary | ICD-10-CM | POA: Diagnosis not present

## 2014-09-07 DIAGNOSIS — J449 Chronic obstructive pulmonary disease, unspecified: Secondary | ICD-10-CM | POA: Diagnosis not present

## 2015-01-04 DIAGNOSIS — R208 Other disturbances of skin sensation: Secondary | ICD-10-CM | POA: Diagnosis not present

## 2015-01-18 DIAGNOSIS — L821 Other seborrheic keratosis: Secondary | ICD-10-CM | POA: Diagnosis not present

## 2015-01-18 DIAGNOSIS — G548 Other nerve root and plexus disorders: Secondary | ICD-10-CM | POA: Diagnosis not present

## 2015-01-18 DIAGNOSIS — D225 Melanocytic nevi of trunk: Secondary | ICD-10-CM | POA: Diagnosis not present

## 2015-01-18 DIAGNOSIS — L309 Dermatitis, unspecified: Secondary | ICD-10-CM | POA: Diagnosis not present

## 2015-01-29 ENCOUNTER — Ambulatory Visit
Admission: RE | Admit: 2015-01-29 | Discharge: 2015-01-29 | Disposition: A | Discharge status: Home / Self Care | Payer: Medicare Other | Source: Ambulatory Visit | Attending: Internal Medicine | Admitting: Internal Medicine

## 2015-01-29 ENCOUNTER — Other Ambulatory Visit: Payer: Self-pay | Admitting: Internal Medicine

## 2015-01-29 DIAGNOSIS — Z87891 Personal history of nicotine dependence: Secondary | ICD-10-CM | POA: Diagnosis not present

## 2015-01-29 DIAGNOSIS — R0781 Pleurodynia: Secondary | ICD-10-CM

## 2015-02-22 DIAGNOSIS — E7800 Pure hypercholesterolemia, unspecified: Secondary | ICD-10-CM | POA: Diagnosis not present

## 2015-02-22 DIAGNOSIS — Z1389 Encounter for screening for other disorder: Secondary | ICD-10-CM | POA: Diagnosis not present

## 2015-02-22 DIAGNOSIS — N183 Chronic kidney disease, stage 3 (moderate): Secondary | ICD-10-CM | POA: Diagnosis not present

## 2015-02-22 DIAGNOSIS — K219 Gastro-esophageal reflux disease without esophagitis: Secondary | ICD-10-CM | POA: Diagnosis not present

## 2015-02-22 DIAGNOSIS — E039 Hypothyroidism, unspecified: Secondary | ICD-10-CM | POA: Diagnosis not present

## 2015-02-22 DIAGNOSIS — Z23 Encounter for immunization: Secondary | ICD-10-CM | POA: Diagnosis not present

## 2015-02-22 DIAGNOSIS — F3342 Major depressive disorder, recurrent, in full remission: Secondary | ICD-10-CM | POA: Diagnosis not present

## 2015-02-22 DIAGNOSIS — N400 Enlarged prostate without lower urinary tract symptoms: Secondary | ICD-10-CM | POA: Diagnosis not present

## 2015-02-22 DIAGNOSIS — I10 Essential (primary) hypertension: Secondary | ICD-10-CM | POA: Diagnosis not present

## 2015-02-22 DIAGNOSIS — Z0000 Encounter for general adult medical examination without abnormal findings: Secondary | ICD-10-CM | POA: Diagnosis not present

## 2015-03-09 DIAGNOSIS — Z1211 Encounter for screening for malignant neoplasm of colon: Secondary | ICD-10-CM | POA: Diagnosis not present

## 2015-03-27 DIAGNOSIS — E039 Hypothyroidism, unspecified: Secondary | ICD-10-CM | POA: Diagnosis not present

## 2015-03-27 DIAGNOSIS — R972 Elevated prostate specific antigen [PSA]: Secondary | ICD-10-CM | POA: Diagnosis not present

## 2015-05-17 DIAGNOSIS — R202 Paresthesia of skin: Secondary | ICD-10-CM | POA: Diagnosis not present

## 2015-08-27 DIAGNOSIS — N183 Chronic kidney disease, stage 3 (moderate): Secondary | ICD-10-CM | POA: Diagnosis not present

## 2015-08-27 DIAGNOSIS — K219 Gastro-esophageal reflux disease without esophagitis: Secondary | ICD-10-CM | POA: Diagnosis not present

## 2015-08-27 DIAGNOSIS — I10 Essential (primary) hypertension: Secondary | ICD-10-CM | POA: Diagnosis not present

## 2015-08-27 DIAGNOSIS — F3342 Major depressive disorder, recurrent, in full remission: Secondary | ICD-10-CM | POA: Diagnosis not present

## 2015-08-27 DIAGNOSIS — E039 Hypothyroidism, unspecified: Secondary | ICD-10-CM | POA: Diagnosis not present

## 2015-08-27 DIAGNOSIS — E7800 Pure hypercholesterolemia, unspecified: Secondary | ICD-10-CM | POA: Diagnosis not present

## 2015-09-11 DIAGNOSIS — B356 Tinea cruris: Secondary | ICD-10-CM | POA: Diagnosis not present

## 2015-10-08 DIAGNOSIS — H401114 Primary open-angle glaucoma, right eye, indeterminate stage: Secondary | ICD-10-CM | POA: Diagnosis not present

## 2015-10-08 DIAGNOSIS — H401124 Primary open-angle glaucoma, left eye, indeterminate stage: Secondary | ICD-10-CM | POA: Diagnosis not present

## 2015-10-08 DIAGNOSIS — H35341 Macular cyst, hole, or pseudohole, right eye: Secondary | ICD-10-CM | POA: Diagnosis not present

## 2015-11-01 DIAGNOSIS — R208 Other disturbances of skin sensation: Secondary | ICD-10-CM | POA: Diagnosis not present

## 2016-02-27 DIAGNOSIS — E7800 Pure hypercholesterolemia, unspecified: Secondary | ICD-10-CM | POA: Diagnosis not present

## 2016-02-27 DIAGNOSIS — E039 Hypothyroidism, unspecified: Secondary | ICD-10-CM | POA: Diagnosis not present

## 2016-02-27 DIAGNOSIS — Z23 Encounter for immunization: Secondary | ICD-10-CM | POA: Diagnosis not present

## 2016-02-27 DIAGNOSIS — N400 Enlarged prostate without lower urinary tract symptoms: Secondary | ICD-10-CM | POA: Diagnosis not present

## 2016-02-27 DIAGNOSIS — F3342 Major depressive disorder, recurrent, in full remission: Secondary | ICD-10-CM | POA: Diagnosis not present

## 2016-02-27 DIAGNOSIS — Z7189 Other specified counseling: Secondary | ICD-10-CM | POA: Diagnosis not present

## 2016-02-27 DIAGNOSIS — J449 Chronic obstructive pulmonary disease, unspecified: Secondary | ICD-10-CM | POA: Diagnosis not present

## 2016-02-27 DIAGNOSIS — Z1389 Encounter for screening for other disorder: Secondary | ICD-10-CM | POA: Diagnosis not present

## 2016-02-27 DIAGNOSIS — K219 Gastro-esophageal reflux disease without esophagitis: Secondary | ICD-10-CM | POA: Diagnosis not present

## 2016-02-27 DIAGNOSIS — N183 Chronic kidney disease, stage 3 (moderate): Secondary | ICD-10-CM | POA: Diagnosis not present

## 2016-02-27 DIAGNOSIS — Z0000 Encounter for general adult medical examination without abnormal findings: Secondary | ICD-10-CM | POA: Diagnosis not present

## 2016-02-27 DIAGNOSIS — I10 Essential (primary) hypertension: Secondary | ICD-10-CM | POA: Diagnosis not present

## 2016-04-18 DIAGNOSIS — D696 Thrombocytopenia, unspecified: Secondary | ICD-10-CM | POA: Diagnosis not present

## 2016-08-27 DIAGNOSIS — E039 Hypothyroidism, unspecified: Secondary | ICD-10-CM | POA: Diagnosis not present

## 2016-08-27 DIAGNOSIS — R202 Paresthesia of skin: Secondary | ICD-10-CM | POA: Diagnosis not present

## 2016-08-27 DIAGNOSIS — E7800 Pure hypercholesterolemia, unspecified: Secondary | ICD-10-CM | POA: Diagnosis not present

## 2016-08-27 DIAGNOSIS — F3342 Major depressive disorder, recurrent, in full remission: Secondary | ICD-10-CM | POA: Diagnosis not present

## 2016-08-27 DIAGNOSIS — N183 Chronic kidney disease, stage 3 (moderate): Secondary | ICD-10-CM | POA: Diagnosis not present

## 2016-08-27 DIAGNOSIS — J449 Chronic obstructive pulmonary disease, unspecified: Secondary | ICD-10-CM | POA: Diagnosis not present

## 2016-08-27 DIAGNOSIS — N400 Enlarged prostate without lower urinary tract symptoms: Secondary | ICD-10-CM | POA: Diagnosis not present

## 2016-08-27 DIAGNOSIS — I10 Essential (primary) hypertension: Secondary | ICD-10-CM | POA: Diagnosis not present

## 2016-08-27 DIAGNOSIS — K219 Gastro-esophageal reflux disease without esophagitis: Secondary | ICD-10-CM | POA: Diagnosis not present

## 2016-09-25 DIAGNOSIS — M48062 Spinal stenosis, lumbar region with neurogenic claudication: Secondary | ICD-10-CM | POA: Diagnosis not present

## 2016-09-25 DIAGNOSIS — M48061 Spinal stenosis, lumbar region without neurogenic claudication: Secondary | ICD-10-CM | POA: Diagnosis not present

## 2016-09-25 DIAGNOSIS — M5416 Radiculopathy, lumbar region: Secondary | ICD-10-CM | POA: Diagnosis not present

## 2016-10-08 DIAGNOSIS — M48062 Spinal stenosis, lumbar region with neurogenic claudication: Secondary | ICD-10-CM | POA: Diagnosis not present

## 2016-10-21 DIAGNOSIS — M5416 Radiculopathy, lumbar region: Secondary | ICD-10-CM | POA: Diagnosis not present

## 2016-11-04 DIAGNOSIS — M5416 Radiculopathy, lumbar region: Secondary | ICD-10-CM | POA: Diagnosis not present

## 2016-11-04 DIAGNOSIS — M48062 Spinal stenosis, lumbar region with neurogenic claudication: Secondary | ICD-10-CM | POA: Diagnosis not present

## 2016-12-01 DIAGNOSIS — H401124 Primary open-angle glaucoma, left eye, indeterminate stage: Secondary | ICD-10-CM | POA: Diagnosis not present

## 2016-12-01 DIAGNOSIS — H401114 Primary open-angle glaucoma, right eye, indeterminate stage: Secondary | ICD-10-CM | POA: Diagnosis not present

## 2017-03-09 DIAGNOSIS — I10 Essential (primary) hypertension: Secondary | ICD-10-CM | POA: Diagnosis not present

## 2017-03-09 DIAGNOSIS — J449 Chronic obstructive pulmonary disease, unspecified: Secondary | ICD-10-CM | POA: Diagnosis not present

## 2017-03-09 DIAGNOSIS — Z23 Encounter for immunization: Secondary | ICD-10-CM | POA: Diagnosis not present

## 2017-03-09 DIAGNOSIS — E039 Hypothyroidism, unspecified: Secondary | ICD-10-CM | POA: Diagnosis not present

## 2017-03-09 DIAGNOSIS — L299 Pruritus, unspecified: Secondary | ICD-10-CM | POA: Diagnosis not present

## 2017-03-09 DIAGNOSIS — N400 Enlarged prostate without lower urinary tract symptoms: Secondary | ICD-10-CM | POA: Diagnosis not present

## 2017-03-09 DIAGNOSIS — F3342 Major depressive disorder, recurrent, in full remission: Secondary | ICD-10-CM | POA: Diagnosis not present

## 2017-03-09 DIAGNOSIS — N183 Chronic kidney disease, stage 3 (moderate): Secondary | ICD-10-CM | POA: Diagnosis not present

## 2017-03-09 DIAGNOSIS — E7800 Pure hypercholesterolemia, unspecified: Secondary | ICD-10-CM | POA: Diagnosis not present

## 2017-03-09 DIAGNOSIS — Z1389 Encounter for screening for other disorder: Secondary | ICD-10-CM | POA: Diagnosis not present

## 2017-03-09 DIAGNOSIS — K219 Gastro-esophageal reflux disease without esophagitis: Secondary | ICD-10-CM | POA: Diagnosis not present

## 2017-03-09 DIAGNOSIS — Z7189 Other specified counseling: Secondary | ICD-10-CM | POA: Diagnosis not present

## 2017-03-09 DIAGNOSIS — Z0000 Encounter for general adult medical examination without abnormal findings: Secondary | ICD-10-CM | POA: Diagnosis not present

## 2017-04-03 DIAGNOSIS — H401134 Primary open-angle glaucoma, bilateral, indeterminate stage: Secondary | ICD-10-CM | POA: Diagnosis not present

## 2017-04-23 ENCOUNTER — Encounter (HOSPITAL_COMMUNITY): Payer: Self-pay | Admitting: Nurse Practitioner

## 2017-04-23 ENCOUNTER — Other Ambulatory Visit: Payer: Self-pay

## 2017-04-23 ENCOUNTER — Inpatient Hospital Stay (HOSPITAL_COMMUNITY)
Admission: EM | Admit: 2017-04-23 | Discharge: 2017-04-29 | DRG: 918 | Disposition: A | Discharge status: Other Acute Inpatient Hospital | Payer: Medicare Other | Attending: Internal Medicine | Admitting: Internal Medicine

## 2017-04-23 DIAGNOSIS — H9319 Tinnitus, unspecified ear: Secondary | ICD-10-CM | POA: Diagnosis present

## 2017-04-23 DIAGNOSIS — Y900 Blood alcohol level of less than 20 mg/100 ml: Secondary | ICD-10-CM | POA: Diagnosis present

## 2017-04-23 DIAGNOSIS — F329 Major depressive disorder, single episode, unspecified: Secondary | ICD-10-CM | POA: Diagnosis present

## 2017-04-23 DIAGNOSIS — Z8679 Personal history of other diseases of the circulatory system: Secondary | ICD-10-CM | POA: Diagnosis not present

## 2017-04-23 DIAGNOSIS — T40602A Poisoning by unspecified narcotics, intentional self-harm, initial encounter: Secondary | ICD-10-CM | POA: Diagnosis not present

## 2017-04-23 DIAGNOSIS — R404 Transient alteration of awareness: Secondary | ICD-10-CM | POA: Diagnosis not present

## 2017-04-23 DIAGNOSIS — Z87891 Personal history of nicotine dependence: Secondary | ICD-10-CM | POA: Diagnosis not present

## 2017-04-23 DIAGNOSIS — N529 Male erectile dysfunction, unspecified: Secondary | ICD-10-CM | POA: Diagnosis present

## 2017-04-23 DIAGNOSIS — Z66 Do not resuscitate: Secondary | ICD-10-CM | POA: Diagnosis present

## 2017-04-23 DIAGNOSIS — E785 Hyperlipidemia, unspecified: Secondary | ICD-10-CM | POA: Diagnosis present

## 2017-04-23 DIAGNOSIS — I10 Essential (primary) hypertension: Secondary | ICD-10-CM | POA: Diagnosis present

## 2017-04-23 DIAGNOSIS — T1491XA Suicide attempt, initial encounter: Secondary | ICD-10-CM | POA: Diagnosis present

## 2017-04-23 DIAGNOSIS — G8929 Other chronic pain: Secondary | ICD-10-CM | POA: Diagnosis present

## 2017-04-23 DIAGNOSIS — Z915 Personal history of self-harm: Secondary | ICD-10-CM | POA: Diagnosis not present

## 2017-04-23 DIAGNOSIS — T391X1A Poisoning by 4-Aminophenol derivatives, accidental (unintentional), initial encounter: Secondary | ICD-10-CM | POA: Diagnosis present

## 2017-04-23 DIAGNOSIS — E876 Hypokalemia: Secondary | ICD-10-CM | POA: Diagnosis present

## 2017-04-23 DIAGNOSIS — T50901A Poisoning by unspecified drugs, medicaments and biological substances, accidental (unintentional), initial encounter: Secondary | ICD-10-CM | POA: Diagnosis present

## 2017-04-23 DIAGNOSIS — Z8249 Family history of ischemic heart disease and other diseases of the circulatory system: Secondary | ICD-10-CM | POA: Diagnosis not present

## 2017-04-23 DIAGNOSIS — K219 Gastro-esophageal reflux disease without esophagitis: Secondary | ICD-10-CM | POA: Diagnosis present

## 2017-04-23 DIAGNOSIS — J449 Chronic obstructive pulmonary disease, unspecified: Secondary | ICD-10-CM | POA: Diagnosis present

## 2017-04-23 DIAGNOSIS — Z79899 Other long term (current) drug therapy: Secondary | ICD-10-CM | POA: Diagnosis not present

## 2017-04-23 DIAGNOSIS — Z79890 Hormone replacement therapy (postmenopausal): Secondary | ICD-10-CM

## 2017-04-23 DIAGNOSIS — F332 Major depressive disorder, recurrent severe without psychotic features: Secondary | ICD-10-CM | POA: Diagnosis present

## 2017-04-23 DIAGNOSIS — Z823 Family history of stroke: Secondary | ICD-10-CM

## 2017-04-23 DIAGNOSIS — M549 Dorsalgia, unspecified: Secondary | ICD-10-CM | POA: Diagnosis present

## 2017-04-23 DIAGNOSIS — Y92009 Unspecified place in unspecified non-institutional (private) residence as the place of occurrence of the external cause: Secondary | ICD-10-CM | POA: Diagnosis not present

## 2017-04-23 DIAGNOSIS — N400 Enlarged prostate without lower urinary tract symptoms: Secondary | ICD-10-CM | POA: Diagnosis present

## 2017-04-23 DIAGNOSIS — R45851 Suicidal ideations: Secondary | ICD-10-CM | POA: Diagnosis not present

## 2017-04-23 DIAGNOSIS — E039 Hypothyroidism, unspecified: Secondary | ICD-10-CM | POA: Diagnosis not present

## 2017-04-23 DIAGNOSIS — M1990 Unspecified osteoarthritis, unspecified site: Secondary | ICD-10-CM | POA: Diagnosis present

## 2017-04-23 DIAGNOSIS — F1910 Other psychoactive substance abuse, uncomplicated: Secondary | ICD-10-CM | POA: Diagnosis not present

## 2017-04-23 DIAGNOSIS — R112 Nausea with vomiting, unspecified: Secondary | ICD-10-CM

## 2017-04-23 DIAGNOSIS — F4325 Adjustment disorder with mixed disturbance of emotions and conduct: Secondary | ICD-10-CM | POA: Diagnosis present

## 2017-04-23 DIAGNOSIS — R42 Dizziness and giddiness: Secondary | ICD-10-CM | POA: Diagnosis not present

## 2017-04-23 DIAGNOSIS — F39 Unspecified mood [affective] disorder: Secondary | ICD-10-CM | POA: Diagnosis not present

## 2017-04-23 DIAGNOSIS — T391X2A Poisoning by 4-Aminophenol derivatives, intentional self-harm, initial encounter: Principal | ICD-10-CM | POA: Diagnosis present

## 2017-04-23 DIAGNOSIS — Z7982 Long term (current) use of aspirin: Secondary | ICD-10-CM | POA: Diagnosis not present

## 2017-04-23 LAB — CBC
HEMATOCRIT: 46.1 % (ref 39.0–52.0)
Hemoglobin: 14.6 g/dL (ref 13.0–17.0)
MCH: 31.9 pg (ref 26.0–34.0)
MCHC: 31.7 g/dL (ref 30.0–36.0)
MCV: 100.9 fL — ABNORMAL HIGH (ref 78.0–100.0)
Platelets: 164 10*3/uL (ref 150–400)
RBC: 4.57 MIL/uL (ref 4.22–5.81)
RDW: 15.3 % (ref 11.5–15.5)
WBC: 13.8 10*3/uL — AB (ref 4.0–10.5)

## 2017-04-23 LAB — RAPID URINE DRUG SCREEN, HOSP PERFORMED
AMPHETAMINES: NOT DETECTED
BARBITURATES: NOT DETECTED
BENZODIAZEPINES: NOT DETECTED
COCAINE: NOT DETECTED
Opiates: POSITIVE — AB
TETRAHYDROCANNABINOL: NOT DETECTED

## 2017-04-23 LAB — COMPREHENSIVE METABOLIC PANEL
ALBUMIN: 4 g/dL (ref 3.5–5.0)
ALT: 20 U/L (ref 17–63)
AST: 36 U/L (ref 15–41)
Alkaline Phosphatase: 65 U/L (ref 38–126)
Anion gap: 9 (ref 5–15)
BILIRUBIN TOTAL: 0.3 mg/dL (ref 0.3–1.2)
BUN: 18 mg/dL (ref 6–20)
CHLORIDE: 107 mmol/L (ref 101–111)
CO2: 25 mmol/L (ref 22–32)
Calcium: 8.5 mg/dL — ABNORMAL LOW (ref 8.9–10.3)
Creatinine, Ser: 1.31 mg/dL — ABNORMAL HIGH (ref 0.61–1.24)
GFR calc Af Amer: 57 mL/min — ABNORMAL LOW (ref >60)
GFR calc non Af Amer: 49 mL/min — ABNORMAL LOW (ref >60)
GLUCOSE: 136 mg/dL — AB (ref 65–99)
POTASSIUM: 4.1 mmol/L (ref 3.5–5.1)
SODIUM: 141 mmol/L (ref 135–145)
TOTAL PROTEIN: 6.9 g/dL (ref 6.5–8.1)

## 2017-04-23 LAB — PROTIME-INR
INR: 0.93
PROTHROMBIN TIME: 12.4 s (ref 11.4–15.2)

## 2017-04-23 LAB — ETHANOL: Alcohol, Ethyl (B): 16 mg/dL — ABNORMAL HIGH (ref <10)

## 2017-04-23 LAB — ACETAMINOPHEN LEVEL: ACETAMINOPHEN (TYLENOL), SERUM: 31 ug/mL — AB (ref 10–30)

## 2017-04-23 LAB — SALICYLATE LEVEL: Salicylate Lvl: <7 mg/dL (ref 2.8–30.0)

## 2017-04-23 MED ORDER — HYDRALAZINE HCL 20 MG/ML IJ SOLN
10.0000 mg | Freq: Four times a day (QID) | INTRAMUSCULAR | Status: DC | PRN
  Administered 2017-04-23 – 2017-04-24 (×2): 10 mg via INTRAVENOUS
  Filled 2017-04-23 (×2): qty 1

## 2017-04-23 MED ORDER — DEXTROSE 5 % IV SOLN
15.0000 mg/kg/h | INTRAVENOUS | Status: DC
  Administered 2017-04-23 – 2017-04-24 (×2): 15 mg/kg/h via INTRAVENOUS
  Filled 2017-04-23 (×2): qty 200
  Filled 2017-04-23: qty 60
  Filled 2017-04-23: qty 200

## 2017-04-23 MED ORDER — PANTOPRAZOLE SODIUM 40 MG PO TBEC
40.0000 mg | DELAYED_RELEASE_TABLET | Freq: Every day | ORAL | Status: DC
  Filled 2017-04-23: qty 1

## 2017-04-23 MED ORDER — ORAL CARE MOUTH RINSE
15.0000 mL | Freq: Two times a day (BID) | OROMUCOSAL | Status: DC
  Administered 2017-04-23 – 2017-04-29 (×9): 15 mL via OROMUCOSAL

## 2017-04-23 MED ORDER — SODIUM CHLORIDE 0.9 % IV SOLN
INTRAVENOUS | Status: DC
  Administered 2017-04-23: 20 mL via INTRAVENOUS

## 2017-04-23 MED ORDER — ONDANSETRON HCL 4 MG/2ML IJ SOLN
4.0000 mg | Freq: Four times a day (QID) | INTRAMUSCULAR | Status: DC | PRN
  Administered 2017-04-23 – 2017-04-24 (×2): 4 mg via INTRAVENOUS
  Filled 2017-04-23 (×3): qty 2

## 2017-04-23 MED ORDER — ATENOLOL 50 MG PO TABS
50.0000 mg | ORAL_TABLET | Freq: Every day | ORAL | Status: DC
  Filled 2017-04-23: qty 1

## 2017-04-23 MED ORDER — CITALOPRAM HYDROBROMIDE 20 MG PO TABS
20.0000 mg | ORAL_TABLET | Freq: Every day | ORAL | Status: DC
  Administered 2017-04-24 – 2017-04-29 (×6): 20 mg via ORAL
  Filled 2017-04-23 (×7): qty 1

## 2017-04-23 MED ORDER — ENOXAPARIN SODIUM 40 MG/0.4ML ~~LOC~~ SOLN
40.0000 mg | SUBCUTANEOUS | Status: DC
  Administered 2017-04-23 – 2017-04-28 (×6): 40 mg via SUBCUTANEOUS
  Filled 2017-04-23 (×7): qty 0.4

## 2017-04-23 MED ORDER — OXYMETAZOLINE HCL 0.05 % NA SOLN
2.0000 | Freq: Two times a day (BID) | NASAL | Status: AC | PRN
  Administered 2017-04-24 (×2): 2 via NASAL
  Filled 2017-04-23: qty 15

## 2017-04-23 MED ORDER — LEVOTHYROXINE SODIUM 25 MCG PO TABS
137.0000 ug | ORAL_TABLET | Freq: Every day | ORAL | Status: DC
  Administered 2017-04-24 – 2017-04-29 (×6): 137 ug via ORAL
  Filled 2017-04-23 (×6): qty 1

## 2017-04-23 MED ORDER — ACETYLCYSTEINE LOAD VIA INFUSION
150.0000 mg/kg | Freq: Once | INTRAVENOUS | Status: AC
  Administered 2017-04-23: 10890 mg via INTRAVENOUS
  Filled 2017-04-23 (×2): qty 273

## 2017-04-23 MED ORDER — ACETYLCYSTEINE LOAD VIA INFUSION: 150.0000 mg/kg | Freq: Once | INTRAVENOUS | Status: DC

## 2017-04-23 NOTE — Progress Notes (Signed)
Call received from [place] at the poison control center requesting an update on pt. Updated vitals and acetylcysteine information given to caller. All other questions answered to her satisfaction. Colletta [place] mentioned that she would be calling back tomorrow at about 4 pm to f/u on patient's status. Hale Bogus.

## 2017-04-23 NOTE — ED Notes (Signed)
Report received from [place], [place]. Assumed care of patient at this time.

## 2017-04-23 NOTE — ED Notes (Signed)
Patient belongings: black nike socks, keys, flip cell phone, wallet, blue jeans, white shirt, brown leather coat, and brown shoes. Items placed in patient belongings cabinet.

## 2017-04-23 NOTE — ED Notes (Signed)
Spoke with Velta Addison, RN from [facility]. Recommendations include: EKG, cardiac monitoring for a minimum of 6 hours, lab work including an aspirin/tylenol level and supportive care. Charcoal not advised at this time. Encouraged to observe patient for hypotension, mental status changes, respiratory depression and vomiting.

## 2017-04-23 NOTE — ED Notes (Signed)
Bed: EW25 Expected date:  Expected time:  Means of arrival:  Comments: EMS-oxycodone OD

## 2017-04-23 NOTE — ED Notes (Signed)
Report given to RN

## 2017-04-23 NOTE — ED Notes (Signed)
Assigned 1429 @ 15:08 call report @ 15:28

## 2017-04-23 NOTE — ED Triage Notes (Signed)
Patient brought in by EMS. Patient and wife stated he took btw 20-30 percocets last night about midnight. Patient s wife found the suicide note this morning and called. Patient has been vomiting since this morning. Patient states room is spinning.

## 2017-04-23 NOTE — H&P (Signed)
History and Physical    Peter Velasquez:[id number] DOB: 1934/03/22 DOA: 04/23/2017  Referring MD/NP/PA: EDP PCP:  Patient coming from: Home  Chief Complaint: suicide attempt  HPI: Peter Velasquez is a 83 y.o. male with medical history significant of hypertension, hypothyroidism, COPD, chronic back pain was brought to the emergency room by EMS after he was found up tended by his wife with a suicide note by the bedside he reportedly ingested approximately 30 tablets of Percocet last night between 10 and 12 PM, patient now more alert reports that he's been feeling more depressed in the last several weeks he is contemplated suicide before but never attempted it before last night. He denies any vomiting thereafter, he also reportedly drank a few bottles of beer last night. ED Course: Workup in the emergency room noted normal LFTs, Tylenol level elevated at 31 at 14 hour mark which was reportedly call to poison control and it was recommended to start him on an acetylcysteine for at least 6 hours with repeat monitoring of Tylenol level and LFTs, and telemetry monitoring  Review of Systems: As per HPI otherwise 14 point review of systems negative.   Past Medical History:  Diagnosis Date  . BENIGN PROSTATIC HYPERTROPHY 01/13/2007   Qualifier: Diagnosis of  By: Jenny Reichmann MD, Hunt Oris   . COPD 01/09/2007   Qualifier: Diagnosis of  By: Elveria Royals   . DEGENERATIVE JOINT DISEASE 01/09/2007   Qualifier: Diagnosis of  By: Elveria Royals   . DEPRESSION 01/09/2007   Qualifier: Diagnosis of  By: Elveria Royals   . DIVERTICULOSIS, COLON 01/09/2007   Qualifier: Diagnosis of  By: Elveria Royals   . ERECTILE DYSFUNCTION 01/09/2007   Qualifier: Diagnosis of  By: Elveria Royals   . GERD 01/09/2007   Qualifier: Diagnosis of  By: Elveria Royals   . HYPERLIPIDEMIA 01/09/2007   Qualifier: Diagnosis of  By: Elveria Royals   . HYPOTHYROIDISM 01/09/2007   Qualifier:  Diagnosis of  By: Elveria Royals   . PALPITATIONS, HX OF 01/09/2007   Qualifier: Diagnosis of  By: Elveria Royals   . TINNITUS 01/09/2007   Qualifier: Diagnosis of  By: Elveria Royals     History reviewed. No pertinent surgical history.   reports that he quit smoking about 19 years ago. He does not have any smokeless tobacco history on file. He reports that he does not drink alcohol or use drugs.  No Known Allergies  Family History  Problem Relation Age of Onset  . Stroke Father   . Hypertension Father   . Other Sister      Prior to Admission medications   Medication Sig Start Date End Date Taking? Authorizing Provider  atenolol (TENORMIN) 50 MG tablet Take 50 mg by mouth daily.    [provider]  benazepril (LOTENSIN) 20 MG tablet Take 20 mg by mouth daily.    [provider]  citalopram (CELEXA) 20 MG tablet Take 20 mg by mouth daily.    [provider]  levothyroxine (SYNTHROID, LEVOTHROID) 137 MCG tablet Take 137 mcg by mouth daily before breakfast.    [provider]  omeprazole (PRILOSEC) 20 MG capsule Take 20 mg by mouth daily.    [provider]  simvastatin (ZOCOR) 40 MG tablet Take 40 mg by mouth daily.    [provider]    Physical Exam: Vitals:   04/23/17 1255 04/23/17 1256 04/23/17 1401 04/23/17 1401  BP: Marland Kitchen)  160/76  (!) 169/71 140/65  Pulse: 60  (!) 58 69  Resp: 16  16 19   Temp: 98.5 F (36.9 C)     SpO2: 97%  95% 96%  Weight:  72.6 kg (160 lb)    Height:  6\' 1"  (1.854 m)        Constitutional: Alert awake, elderly male, no distress Vitals:   04/23/17 1255 04/23/17 1256 04/23/17 1401 04/23/17 1401  BP: (!) 160/76  (!) 169/71 140/65  Pulse: 60  (!) 58 69  Resp: 16  16 19   Temp: 98.5 F (36.9 C)     SpO2: 97%  95% 96%  Weight:  72.6 kg (160 lb)    Height:  6\' 1"  (1.854 m)     Eyes: PERRL, lids and conjunctivae normal ENMT: Oral mucosa dry, no icterus Neck: normal,  supple Respiratory: clear to auscultation bilaterally Cardiovascular: Regular rate and rhythm, no murmurs / rubs / gallops Abdomen: soft, non tender, Bowel sounds positive.  Musculoskeletal: No joint deformity upper and lower extremities. Ext: No edema Skin: no rashes, lesions, ulcers.  Neurologic: CN 2-12 grossly intact. Sensation intact, DTR normal. Strength 5/5 in all 4.  Psychiatric: Flat affect   Labs on Admission: I have personally reviewed following labs and imaging studies  CBC: Recent Labs  Lab 04/23/17 1345  WBC 13.8*  HGB 14.6  HCT 46.1  MCV 100.9*  PLT 390   Basic Metabolic Panel: Recent Labs  Lab 04/23/17 1345  NA 141  K 4.1  CL 107  CO2 25  GLUCOSE 136*  BUN 18  CREATININE 1.31*  CALCIUM 8.5*   GFR: Estimated Creatinine Clearance: 45.4 mL/min (A) (by C-G formula based on SCr of 1.31 mg/dL (H)). Liver Function Tests: Recent Labs  Lab 04/23/17 1345  AST 36  ALT 20  ALKPHOS 65  BILITOT 0.3  PROT 6.9  ALBUMIN 4.0   No results for input(s): LIPASE, AMYLASE in the last 168 hours. No results for input(s): AMMONIA in the last 168 hours. Coagulation Profile: Recent Labs  Lab 04/23/17 1345  INR 0.93   Cardiac Enzymes: No results for input(s): CKTOTAL, CKMB, CKMBINDEX, TROPONINI in the last 168 hours. BNP (last 3 results) No results for input(s): PROBNP in the last 8760 hours. HbA1C: No results for input(s): HGBA1C in the last 72 hours. CBG: No results for input(s): GLUCAP in the last 168 hours. Lipid Profile: No results for input(s): CHOL, HDL, LDLCALC, TRIG, CHOLHDL, LDLDIRECT in the last 72 hours. Thyroid Function Tests: No results for input(s): TSH, T4TOTAL, FREET4, T3FREE, THYROIDAB in the last 72 hours. Anemia Panel: No results for input(s): VITAMINB12, FOLATE, FERRITIN, TIBC, IRON, RETICCTPCT in the last 72 hours. Urine analysis: No results found for: COLORURINE, APPEARANCEUR, LABSPEC, PHURINE, GLUCOSEU, HGBUR, BILIRUBINUR, KETONESUR,  PROTEINUR, UROBILINOGEN, NITRITE, LEUKOCYTESUR Sepsis Labs: @LABRCNTIP (procalcitonin:4,lacticidven:4) )No results found for this or any previous visit (from the past 240 hour(s)).   Radiological Exams on Admission: No results found.  EKG: Independently reviewed. Pending, ordered  Assessment/Plan Principal Problem:   Suicide attempt (HCC)/Percocet overdose -Intentional self-harm -Admit to telemetry, suicide precautions -One-to-one sitter -N-acetyl cysteine per pharmacy protocol for at least 6 hours with repeat LFTs and Tylenol level at 9 PM tonight  Hypertension. -Hold benazepril, continue atenolol   Hypothyroidism -Continue Synthroid  Major depression -As above, continue Celexa -Psych consult  DVT prophylaxis: lovenox Code Status: Full Code Family Communication: none at bedside Disposition Plan: will likely need psych admission Consults called: psych consult pending Admission status: inpatient  Domenic Polite MD [facility] Pager [phone]  If 7PM-7AM, please contact night-coverage [email or web address] Password [email or web address]  04/23/2017, 3:06 PM

## 2017-04-23 NOTE — Progress Notes (Signed)
Pharmacy: Acetad[facility]  Patient's an 83 y.o. M presented to the ED on 12/20 with suicidal ideology and overdose.  He reported taking about 30 percocet tablets around 10p and 67mn last night.   Labs at 1345 on 12/20:  - INR 0.93 - AST/ALT 36/20 - Tbili 0.3 - tylenol level 31  Contacted poison control (spoke to [place]).  Poison control recommends to start acetadote load and drip and check  LFTs and tylenol 20-22 hrs after load is given to assess if continuation of drip is needed.  Dr. Ashok Cordia is aware of the plan.  Plan: - Acetadote 150 mg/kg x1, then drip at 15 mg/kg/hr - check LFTs and tylenol level 21 hrs after acetadote load  Dia Sitter, PharmD, BCPS 04/23/2017 3:15 PM

## 2017-04-23 NOTE — ED Provider Notes (Signed)
[facility] DEPT Provider Note   CSN: [id number] Arrival date & time: 04/23/17  1242     History   Chief Complaint No chief complaint on file.   HPI Peter Velasquez is a 83 y.o. male.  Patient c/o depression and overdose.  States feeling more depressed in past week, and especially in past couple days. Symptoms moderate-severe, persistent. Last night at [place] took approximately 30 percocet (5 mg/325 mg). Denies any other ingestion other than also drinking heavily last night. A couple episodes of emesis this AM, emesis not bloody or bilious. Denies abd pain. Spouse found suicide note today and called EMS.     The history is provided by the patient.    Past Medical History:  Diagnosis Date  . BENIGN PROSTATIC HYPERTROPHY 01/13/2007   Qualifier: Diagnosis of  By: Jenny Reichmann MD, Hunt Oris   . COPD 01/09/2007   Qualifier: Diagnosis of  By: Elveria Royals   . DEGENERATIVE JOINT DISEASE 01/09/2007   Qualifier: Diagnosis of  By: Elveria Royals   . DEPRESSION 01/09/2007   Qualifier: Diagnosis of  By: Elveria Royals   . DIVERTICULOSIS, COLON 01/09/2007   Qualifier: Diagnosis of  By: Elveria Royals   . ERECTILE DYSFUNCTION 01/09/2007   Qualifier: Diagnosis of  By: Elveria Royals   . GERD 01/09/2007   Qualifier: Diagnosis of  By: Elveria Royals   . HYPERLIPIDEMIA 01/09/2007   Qualifier: Diagnosis of  By: Elveria Royals   . HYPOTHYROIDISM 01/09/2007   Qualifier: Diagnosis of  By: Elveria Royals   . PALPITATIONS, HX OF 01/09/2007   Qualifier: Diagnosis of  By: Elveria Royals   . TINNITUS 01/09/2007   Qualifier: Diagnosis of  By: Elveria Royals     Patient Active Problem List   Diagnosis Date Noted  . BENIGN PROSTATIC HYPERTROPHY 01/13/2007  . HYPOTHYROIDISM 01/09/2007  . HYPERLIPIDEMIA 01/09/2007  . ERECTILE DYSFUNCTION 01/09/2007  . DEPRESSION 01/09/2007  . TINNITUS 01/09/2007  . COPD  01/09/2007  . GERD 01/09/2007  . DIVERTICULOSIS, COLON 01/09/2007  . DEGENERATIVE JOINT DISEASE 01/09/2007  . PALPITATIONS, HX OF 01/09/2007    History reviewed. No pertinent surgical history.     Home Medications    Prior to Admission medications   Medication Sig Start Date End Date Taking? Authorizing Provider  atenolol (TENORMIN) 50 MG tablet Take 50 mg by mouth daily.    [provider]  benazepril (LOTENSIN) 20 MG tablet Take 20 mg by mouth daily.    [provider]  citalopram (CELEXA) 20 MG tablet Take 20 mg by mouth daily.    [provider]  levothyroxine (SYNTHROID, LEVOTHROID) 137 MCG tablet Take 137 mcg by mouth daily before breakfast.    [provider]  omeprazole (PRILOSEC) 20 MG capsule Take 20 mg by mouth daily.    [provider]  simvastatin (ZOCOR) 40 MG tablet Take 40 mg by mouth daily.    [provider]    Family History Family History  Problem Relation Age of Onset  . Stroke Father   . Hypertension Father   . Other Sister     Social History Social History   Tobacco Use  . Smoking status: Former Smoker    Last attempt to quit: 01/23/1998    Years since quitting: 19.2  Substance Use Topics  . Alcohol use: No  . Drug use: No     Allergies   Patient has no known allergies.  Review of Systems Review of Systems  Constitutional: Negative for fever.  HENT: Negative for sore throat.   Eyes: Negative for visual disturbance.  Respiratory: Negative for shortness of breath.   Cardiovascular: Negative for chest pain.  Gastrointestinal: Positive for nausea and vomiting. Negative for abdominal pain.  Genitourinary: Negative for flank pain.  Musculoskeletal: Negative for back pain and neck pain.  Skin: Negative for rash.  Neurological: Negative for headaches.  Hematological: Does not bruise/bleed easily.  Psychiatric/Behavioral: Negative for confusion.     Physical Exam Updated Vital  Signs BP (!) 160/76   Pulse 60   Temp 98.5 F (36.9 C)   Resp 16   Ht 1.854 m (6\' 1" )   Wt 72.6 kg (160 lb)   SpO2 97%   BMI 21.11 kg/m   Physical Exam  Constitutional: He is oriented to person, place, and time. He appears well-developed and well-nourished. No distress.  HENT:  Head: Atraumatic.  Mouth/Throat: Oropharynx is clear and moist.  Eyes: Conjunctivae are normal. Pupils are equal, round, and reactive to light. No scleral icterus.  Neck: Neck supple. No tracheal deviation present.  Cardiovascular: Normal rate, regular rhythm, normal heart sounds and intact distal pulses. Exam reveals no gallop and no friction rub.  No murmur heard. Pulmonary/Chest: Effort normal and breath sounds normal. No accessory muscle usage. No respiratory distress.  Abdominal: Soft. Bowel sounds are normal. He exhibits no distension. There is no tenderness.  Musculoskeletal: He exhibits no edema.  Neurological: He is alert and oriented to person, place, and time.  Skin: Skin is warm and dry. No rash noted. He is not diaphoretic.  Psychiatric:  Depressed mood/flat affect.   Nursing note and vitals reviewed.    ED Treatments / Results  Labs (all labs ordered are listed, but only abnormal results are displayed) Results for orders placed or performed during the hospital encounter of 04/23/17  Protime-INR  Result Value Ref Range   Prothrombin Time 12.4 11.4 - 15.2 seconds   INR 0.93   CBC  Result Value Ref Range   WBC 13.8 (H) 4.0 - 10.5 K/uL   RBC 4.57 4.22 - 5.81 MIL/uL   Hemoglobin 14.6 13.0 - 17.0 g/dL   HCT 46.1 39.0 - 52.0 %   MCV 100.9 (H) 78.0 - 100.0 fL   MCH 31.9 26.0 - 34.0 pg   MCHC 31.7 30.0 - 36.0 g/dL   RDW 15.3 11.5 - 15.5 %   Platelets 164 150 - 400 K/uL  Comprehensive metabolic panel  Result Value Ref Range   Sodium 141 135 - 145 mmol/L   Potassium 4.1 3.5 - 5.1 mmol/L   Chloride 107 101 - 111 mmol/L   CO2 25 22 - 32 mmol/L   Glucose, Bld 136 (H) 65 - 99 mg/dL    BUN 18 6 - 20 mg/dL   Creatinine, Ser 1.31 (H) 0.61 - 1.24 mg/dL   Calcium 8.5 (L) 8.9 - 10.3 mg/dL   Total Protein 6.9 6.5 - 8.1 g/dL   Albumin 4.0 3.5 - 5.0 g/dL   AST 36 15 - 41 U/L   ALT 20 17 - 63 U/L   Alkaline Phosphatase 65 38 - 126 U/L   Total Bilirubin 0.3 0.3 - 1.2 mg/dL   GFR calc non Af Amer 49 (L) >60 mL/min   GFR calc Af Amer 57 (L) >60 mL/min   Anion gap 9 5 - 15  Ethanol  Result Value Ref Range   Alcohol, Ethyl (B) 16 (H) <10  mg/dL  Acetaminophen level  Result Value Ref Range   Acetaminophen (Tylenol), Serum 31 (H) 10 - 30 ug/mL  Salicylate level  Result Value Ref Range   Salicylate Lvl <1.6 2.8 - 30.0 mg/dL   EKG  EKG Interpretation None       Radiology No results found.  Procedures Procedures (including critical care time)  Medications Ordered in ED Medications  0.9 %  sodium chloride infusion (not administered)     Initial Impression / Assessment and Plan / ED Course  I have reviewed the triage vital signs and the nursing notes.  Pertinent labs & imaging results that were available during my care of the patient were reviewed by me and considered in my medical decision making (see chart for details).  Iv ns. Labs.   Reviewed nursing notes and prior charts for additional history.   Pt indicates he took 30 percocet tablets as single dose - reports vary between 10 pm and 12 mn last night.  That would represent a 9.75 gm ingestion, and level at approximately 14-16 hrs is 31, therefore will tx with acetylcysteine.    Hospitalists consulted for admission.   Final Clinical Impressions(s) / ED Diagnoses   Final diagnoses:  None    ED Discharge Orders    None       Lajean Saver, MD 04/23/17 1433

## 2017-04-24 DIAGNOSIS — F1910 Other psychoactive substance abuse, uncomplicated: Secondary | ICD-10-CM

## 2017-04-24 DIAGNOSIS — F4325 Adjustment disorder with mixed disturbance of emotions and conduct: Secondary | ICD-10-CM

## 2017-04-24 DIAGNOSIS — Z87891 Personal history of nicotine dependence: Secondary | ICD-10-CM

## 2017-04-24 LAB — COMPREHENSIVE METABOLIC PANEL
ALBUMIN: 3.3 g/dL — AB (ref 3.5–5.0)
ALK PHOS: 79 U/L (ref 38–126)
ALT: 22 U/L (ref 17–63)
ALT: 34 U/L (ref 17–63)
ANION GAP: 8 (ref 5–15)
AST: 46 U/L — ABNORMAL HIGH (ref 15–41)
AST: 57 U/L — AB (ref 15–41)
Albumin: 3.5 g/dL (ref 3.5–5.0)
Alkaline Phosphatase: 57 U/L (ref 38–126)
Anion gap: 9 (ref 5–15)
BILIRUBIN TOTAL: 0.6 mg/dL (ref 0.3–1.2)
BUN: 18 mg/dL (ref 6–20)
BUN: 16 mg/dL (ref 6–20)
CHLORIDE: 106 mmol/L (ref 101–111)
CHLORIDE: 103 mmol/L (ref 101–111)
CO2: 26 mmol/L (ref 22–32)
CO2: 28 mmol/L (ref 22–32)
CREATININE: 1.14 mg/dL (ref 0.61–1.24)
Calcium: 8.4 mg/dL — ABNORMAL LOW (ref 8.9–10.3)
Calcium: 8.7 mg/dL — ABNORMAL LOW (ref 8.9–10.3)
Creatinine, Ser: 1.04 mg/dL (ref 0.61–1.24)
GFR calc Af Amer: >60 mL/min (ref >60)
GFR calc Af Amer: >60 mL/min (ref >60)
GFR calc non Af Amer: 58 mL/min — ABNORMAL LOW (ref >60)
GLUCOSE: 123 mg/dL — AB (ref 65–99)
Glucose, Bld: 114 mg/dL — ABNORMAL HIGH (ref 65–99)
POTASSIUM: 3.6 mmol/L (ref 3.5–5.1)
Potassium: 3.7 mmol/L (ref 3.5–5.1)
SODIUM: 140 mmol/L (ref 135–145)
Sodium: 140 mmol/L (ref 135–145)
TOTAL PROTEIN: 6.1 g/dL — AB (ref 6.5–8.1)
Total Bilirubin: 0.7 mg/dL (ref 0.3–1.2)
Total Protein: 6.6 g/dL (ref 6.5–8.1)

## 2017-04-24 LAB — CBC
HEMATOCRIT: 42.5 % (ref 39.0–52.0)
Hemoglobin: 13.6 g/dL (ref 13.0–17.0)
MCH: 31.1 pg (ref 26.0–34.0)
MCHC: 32 g/dL (ref 30.0–36.0)
MCV: 97.3 fL (ref 78.0–100.0)
PLATELETS: 162 10*3/uL (ref 150–400)
RBC: 4.37 MIL/uL (ref 4.22–5.81)
RDW: 15.1 % (ref 11.5–15.5)
WBC: 12.1 10*3/uL — ABNORMAL HIGH (ref 4.0–10.5)

## 2017-04-24 LAB — ACETAMINOPHEN LEVEL: Acetaminophen (Tylenol), Serum: <10 ug/mL — ABNORMAL LOW (ref 10–30)

## 2017-04-24 MED ORDER — ATENOLOL 50 MG PO TABS
50.0000 mg | ORAL_TABLET | Freq: Every day | ORAL | Status: DC
  Administered 2017-04-24 – 2017-04-29 (×6): 50 mg via ORAL
  Filled 2017-04-24 (×6): qty 1

## 2017-04-24 MED ORDER — BENAZEPRIL HCL 20 MG PO TABS
20.0000 mg | ORAL_TABLET | Freq: Every day | ORAL | Status: DC
  Administered 2017-04-24 – 2017-04-29 (×6): 20 mg via ORAL
  Filled 2017-04-24 (×6): qty 1

## 2017-04-24 MED ORDER — AMLODIPINE BESYLATE 10 MG PO TABS
10.0000 mg | ORAL_TABLET | Freq: Every day | ORAL | Status: DC
  Administered 2017-04-24 – 2017-04-29 (×6): 10 mg via ORAL
  Filled 2017-04-24 (×6): qty 1

## 2017-04-24 MED ORDER — PANTOPRAZOLE SODIUM 40 MG PO TBEC
40.0000 mg | DELAYED_RELEASE_TABLET | Freq: Every day | ORAL | Status: DC
Start: 2017-04-24 — End: 2017-04-29
  Administered 2017-04-24 – 2017-04-29 (×6): 40 mg via ORAL
  Filled 2017-04-24 (×5): qty 1

## 2017-04-24 MED ORDER — THIAMINE HCL 100 MG/ML IJ SOLN
100.0000 mg | Freq: Every day | INTRAMUSCULAR | Status: DC
  Administered 2017-04-24 – 2017-04-25 (×2): 100 mg via INTRAVENOUS
  Filled 2017-04-24 (×2): qty 2

## 2017-04-24 NOTE — Progress Notes (Signed)
Pharmacy: Acetad[facility]  Patient's an 83 y.o. M presented to the ED on 12/20 with suicidal ideology and overdose.  He reported taking about 30 percocet tablets around 10p and 75mn last night.   Labs at 1345 on 12/20:  - INR 0.93 - AST/ALT 36/20 - Tbili 0.3 - tylenol level 31  Labs at 04:31 on 12/21 - AST/ALT 46/22  Labs at 12:44 on 12/21: - AST/ALT 57/34 - tylenol level < 10  Contacted poison control (spoke to Audrea Muscat who consulted with Dr. Kathlene Cote).  Poison control recommends to continue acetadote drip for an additional 24hr and check  LFTs and INR in 12hr and 24hr and contact them with results.  Dr. Broadus John is aware of the plan.  Plan: - Continue Acetadote drip at 15 mg/kg/hr - Check LFTs and INR in 12 hrs and 24 hrs from now  Peggyann Juba, PharmD, BCPS Pager: [phone] 04/24/2017 2:12 PM

## 2017-04-24 NOTE — Progress Notes (Signed)
   04/24/17 1517  Clinical Encounter Type  Visited With Patient;Health care provider  Visit Type Initial  Referral From Physician  Consult/Referral To Chaplain  Spiritual Encounters  Spiritual Needs Emotional   Responding a [facility] for suicidal thoughts.  Patient was receptive to the visit and sitter was present.  When I asked how he was, he said better than yesterday.  I asked what happened yesterday and he just said I was not feeling too well.  When I asked about family in town he seemed to say he did not have any family close by, but I asked about friends or neighbors know he was here and he said oh yes the family knows.  He was pleasant to speak with, but did not want to say why he was here.  Will follow as needed. Chaplain Katherene Ponto

## 2017-04-24 NOTE — Consult Note (Signed)
[facility] Consult   Reason for Consult:  Suicide attempt  Referring Physician:  Dr. Broadus John  Patient Identification: Peter Velasquez MRN:  [id number] Principal Diagnosis: Adjustment disorder with mixed disturbance of emotions and conduct Diagnosis:   Patient Active Problem List   Diagnosis Date Noted  . Suicide attempt ([place]) [T14.91XA] 04/23/2017  . Overdose of acetaminophen [T39.1X1A] 04/23/2017  . Overdose [T50.901A] 04/23/2017  . BENIGN PROSTATIC HYPERTROPHY [N40.0] 01/13/2007  . Hypothyroidism [E03.9] 01/09/2007  . HYPERLIPIDEMIA [E78.5] 01/09/2007  . ERECTILE DYSFUNCTION [F52.8] 01/09/2007  . DEPRESSION [F32.9] 01/09/2007  . 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 COPD [J44.9] 01/09/2007  . GERD [K21.9] 01/09/2007  . DIVERTICULOSIS, COLON [K57.30] 01/09/2007  . DEGENERATIVE JOINT DISEASE [M19.90] 01/09/2007  . PALPITATIONS, HX OF [Z86.79] 01/09/2007    Total Time spent with patient: 1 hour  Subjective:   Peter Velasquez is a 83 y.o. male patient admitted with suicide attempt by overdose with Percocet.  HPI:   Per chart review, patient was found obtunded by his wife with a suicide note. He reportedly ingested 30 tablets of Percocet. UDS was positive for opiates. BAL was 16 on admission. Poison control recommended providing NAC. He has a history of MDD and is prescribed Celexa 20 mg daily.   On interview, Mr. Peter Velasquez reports that he overdosed on Percocet following an argument with his wife. He reports overdosing on the pain medication his doctor prescribed for his back 3 months ago. He never took it until he overdosed on it. He reports that he and his wife were drinking at home. His niece had called a couple times to speak to them. She spoke to him the first time and then spoke to his wife. His wife was "siding" with his niece about an issue that his niece mentioned about his brother. He hung the phone up while his wife was speaking to his niece so an argument ensued. His  wife went upstairs to lay down for bed and he stayed downstairs. He has been sleeping separately from his wife since she had a recent shoulder surgery and fears hurting her shoulder if they sleep in the same bed. He reports that he began to have suicidal thoughts and wanted to "get the hell out of here." He reports frequent arguments with his wife and fleeting SI following arguments that he has never acted on. He feels like this time it was different because he feels like he is "83" and he has lived a good life but he also wanted "to get off this roller coaster ride." Today, he reports that he is glad to be alive. He denies problems with his mood when his relationship with his wife is good. They argue about every 2 weeks about insignificant issues. He will briefly have thoughts that life is not worth living. He has told his wife about these thoughts. He denies positive neurovegetative symptoms. He has been taking Celexa for 30 years. He does not remember the nature of his depression prior to starting Celexa. He denies a history of HI or AVH. He denies access to weapons at home.   Past Psychiatric History: Depression   Risk to Self: Is patient at risk for suicide?: Yes Risk to Others:  None. Denies HI. Prior Inpatient Therapy:  Denies  Prior Outpatient Therapy:  He is prescribed Celexa by his PCP.   Past Medical History:  Past Medical History:  Diagnosis Date  . BENIGN PROSTATIC HYPERTROPHY 01/13/2007   Qualifier: Diagnosis of  By: Jenny Reichmann MD,  Hunt Oris   . COPD 01/09/2007   Qualifier: Diagnosis of  By: Elveria Royals   . DEGENERATIVE JOINT DISEASE 01/09/2007   Qualifier: Diagnosis of  By: Elveria Royals   . DEPRESSION 01/09/2007   Qualifier: Diagnosis of  By: Elveria Royals   . DIVERTICULOSIS, COLON 01/09/2007   Qualifier: Diagnosis of  By: Elveria Royals   . ERECTILE DYSFUNCTION 01/09/2007   Qualifier: Diagnosis of  By: Elveria Royals   . GERD 01/09/2007    Qualifier: Diagnosis of  By: Elveria Royals   . HYPERLIPIDEMIA 01/09/2007   Qualifier: Diagnosis of  By: Elveria Royals   . HYPOTHYROIDISM 01/09/2007   Qualifier: Diagnosis of  By: Elveria Royals   . PALPITATIONS, HX OF 01/09/2007   Qualifier: Diagnosis of  By: Elveria Royals   . TINNITUS 01/09/2007   Qualifier: Diagnosis of  By: Elveria Royals    History reviewed. No pertinent surgical history. Family History:  Family History  Problem Relation Age of Onset  . Stroke Father   . Hypertension Father   . Other Sister    Family Psychiatric  History: His son committed suicide. He was a substance abuser.   Social History:  Social History   Substance and Sexual Activity  Alcohol Use No     Social History   Substance and Sexual Activity  Drug Use No    Social History   Socioeconomic History  . Marital status: Married    Spouse name: None  . Number of children: None  . Years of education: None  . Highest education level: None  Social Needs  . Financial resource strain: None  . Food insecurity - worry: None  . Food insecurity - inability: None  . Transportation needs - medical: None  . Transportation needs - non-medical: None  Occupational History  . None  Tobacco Use  . Smoking status: Former Smoker    Last attempt to quit: 01/23/1998    Years since quitting: 19.2  . Smokeless tobacco: Never Used  Substance and Sexual Activity  . Alcohol use: No  . Drug use: No  . Sexual activity: No  Other Topics Concern  . None  Social History Narrative  . None   Additional Social History: He is from [place]. He moved to [place] in 1982. He lives at home with his wife. He has 2 living daughters. 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 He drinks one beer daily and has "several" glasses of bourbon. He quit drinking for 2.5 years ten years ago after gaining weight secondary to alcohol use. He denies  illicit substance use or tobacco use. He previously smoked < 1 ppd since 83 y/o. He quit in 1999.      Allergies:  No Known Allergies  Labs:  Results for orders placed or performed during the hospital encounter of 04/23/17 (from the past 48 hour(s))  Protime-INR     Status: None   Collection Time: 04/23/17  1:45 PM  Result Value Ref Range   Prothrombin Time 12.4 11.4 - 15.2 seconds   INR 0.93   CBC     Status: Abnormal   Collection Time: 04/23/17  1:45 PM  Result Value Ref Range   WBC 13.8 (H) 4.0 - 10.5 K/uL   RBC 4.57 4.22 - 5.81 MIL/uL   Hemoglobin 14.6 13.0 - 17.0 g/dL   HCT 46.1 39.0 - 52.0 %  MCV 100.9 (H) 78.0 - 100.0 fL   MCH 31.9 26.0 - 34.0 pg   MCHC 31.7 30.0 - 36.0 g/dL   RDW 15.3 11.5 - 15.5 %   Platelets 164 150 - 400 K/uL  Comprehensive metabolic panel     Status: Abnormal   Collection Time: 04/23/17  1:45 PM  Result Value Ref Range   Sodium 141 135 - 145 mmol/L   Potassium 4.1 3.5 - 5.1 mmol/L   Chloride 107 101 - 111 mmol/L   CO2 25 22 - 32 mmol/L   Glucose, Bld 136 (H) 65 - 99 mg/dL   BUN 18 6 - 20 mg/dL   Creatinine, Ser 1.31 (H) 0.61 - 1.24 mg/dL   Calcium 8.5 (L) 8.9 - 10.3 mg/dL   Total Protein 6.9 6.5 - 8.1 g/dL   Albumin 4.0 3.5 - 5.0 g/dL   AST 36 15 - 41 U/L   ALT 20 17 - 63 U/L   Alkaline Phosphatase 65 38 - 126 U/L   Total Bilirubin 0.3 0.3 - 1.2 mg/dL   GFR calc non Af Amer 49 (L) >60 mL/min   GFR calc Af Amer 57 (L) >60 mL/min    Comment: (NOTE) The eGFR has been calculated using the CKD EPI equation. This calculation has not been validated in all clinical situations. eGFR's persistently <60 mL/min signify possible Chronic Kidney Disease.    Anion gap 9 5 - 15  Ethanol     Status: Abnormal   Collection Time: 04/23/17  1:45 PM  Result Value Ref Range   Alcohol, Ethyl (B) 16 (H) <10 mg/dL    Comment:        LOWEST DETECTABLE LIMIT FOR SERUM ALCOHOL IS 10 mg/dL FOR MEDICAL PURPOSES ONLY   Acetaminophen level     Status: Abnormal    Collection Time: 04/23/17  1:45 PM  Result Value Ref Range   Acetaminophen (Tylenol), Serum 31 (H) 10 - 30 ug/mL    Comment:        THERAPEUTIC CONCENTRATIONS VARY SIGNIFICANTLY. A RANGE OF 10-30 ug/mL MAY BE AN EFFECTIVE CONCENTRATION FOR MANY PATIENTS. HOWEVER, SOME ARE BEST TREATED AT CONCENTRATIONS OUTSIDE THIS RANGE. ACETAMINOPHEN CONCENTRATIONS >150 ug/mL AT 4 HOURS AFTER INGESTION AND >50 ug/mL AT 12 HOURS AFTER INGESTION ARE OFTEN ASSOCIATED WITH TOXIC REACTIONS.   Salicylate level     Status: None   Collection Time: 04/23/17  1:45 PM  Result Value Ref Range   Salicylate Lvl <2.5 2.8 - 30.0 mg/dL  Rapid urine drug screen (hospital performed)     Status: Abnormal   Collection Time: 04/23/17  2:01 PM  Result Value Ref Range   Opiates POSITIVE (A) NONE DETECTED   Cocaine NONE DETECTED NONE DETECTED   Benzodiazepines NONE DETECTED NONE DETECTED   Amphetamines NONE DETECTED NONE DETECTED   Tetrahydrocannabinol NONE DETECTED NONE DETECTED   Barbiturates NONE DETECTED NONE DETECTED    Comment: (NOTE) DRUG SCREEN FOR MEDICAL PURPOSES ONLY.  IF CONFIRMATION IS NEEDED FOR ANY PURPOSE, NOTIFY LAB WITHIN 5 DAYS. LOWEST DETECTABLE LIMITS FOR URINE DRUG SCREEN Drug Class                     Cutoff (ng/mL) Amphetamine and metabolites    1000 Barbiturate and metabolites    200 Benzodiazepine                 366 Tricyclics and metabolites     300 Opiates and metabolites  300 Cocaine and metabolites        300 THC                            50   CBC     Status: Abnormal   Collection Time: 04/24/17  4:31 AM  Result Value Ref Range   WBC 12.1 (H) 4.0 - 10.5 K/uL   RBC 4.37 4.22 - 5.81 MIL/uL   Hemoglobin 13.6 13.0 - 17.0 g/dL   HCT 42.5 39.0 - 52.0 %   MCV 97.3 78.0 - 100.0 fL   MCH 31.1 26.0 - 34.0 pg   MCHC 32.0 30.0 - 36.0 g/dL   RDW 15.1 11.5 - 15.5 %   Platelets 162 150 - 400 K/uL  Comprehensive metabolic panel     Status: Abnormal   Collection Time:  04/24/17  4:31 AM  Result Value Ref Range   Sodium 140 135 - 145 mmol/L   Potassium 3.6 3.5 - 5.1 mmol/L   Chloride 106 101 - 111 mmol/L   CO2 26 22 - 32 mmol/L   Glucose, Bld 123 (H) 65 - 99 mg/dL   BUN 18 6 - 20 mg/dL   Creatinine, Ser 1.04 0.61 - 1.24 mg/dL   Calcium 8.4 (L) 8.9 - 10.3 mg/dL   Total Protein 6.1 (L) 6.5 - 8.1 g/dL   Albumin 3.3 (L) 3.5 - 5.0 g/dL   AST 46 (H) 15 - 41 U/L   ALT 22 17 - 63 U/L   Alkaline Phosphatase 57 38 - 126 U/L   Total Bilirubin 0.6 0.3 - 1.2 mg/dL   GFR calc non Af Amer >60 >60 mL/min   GFR calc Af Amer >60 >60 mL/min    Comment: (NOTE) The eGFR has been calculated using the CKD EPI equation. This calculation has not been validated in all clinical situations. eGFR's persistently <60 mL/min signify possible Chronic Kidney Disease.    Anion gap 8 5 - 15    Current Facility-Administered Medications  Medication Dose Route Frequency Provider Last Rate Last Dose  . 0.9 %  sodium chloride infusion   Intravenous Continuous Lajean Saver, MD 20 mL/hr at 04/23/17 1348 20 mL at 04/23/17 1348  . acetylcysteine (ACETADOTE) 40,000 mg in dextrose 5 % 1,000 mL (40 mg/mL) infusion  15 mg/kg/hr Intravenous Continuous Lajean Saver, MD 27.2 mL/hr at 04/23/17 1620 15 mg/kg/hr at 04/23/17 1620  . amLODipine (NORVASC) tablet 10 mg  10 mg Oral Daily Domenic Polite, MD   10 mg at 04/24/17 6237  . atenolol (TENORMIN) tablet 50 mg  50 mg Oral Daily Domenic Polite, MD   50 mg at 04/24/17 6283  . benazepril (LOTENSIN) tablet 20 mg  20 mg Oral Daily Domenic Polite, MD      . citalopram (CELEXA) tablet 20 mg  20 mg Oral Daily Domenic Polite, MD   20 mg at 04/24/17 1517  . enoxaparin (LOVENOX) injection 40 mg  40 mg Subcutaneous Q24H Domenic Polite, MD   40 mg at 04/23/17 2108  . hydrALAZINE (APRESOLINE) injection 10 mg  10 mg Intravenous Q6H PRN Domenic Polite, MD   10 mg at 04/23/17 1823  . levothyroxine (SYNTHROID, LEVOTHROID) tablet 137 mcg  137 mcg Oral QAC  breakfast Domenic Polite, MD   137 mcg at 04/24/17 [phone]  . MEDLINE mouth rinse  15 mL Mouth Rinse BID Domenic Polite, MD   15 mL at 04/24/17 0905  . ondansetron (ZOFRAN) injection 4  mg  4 mg Intravenous Q6H PRN Lovey Newcomer T, NP   4 mg at 04/23/17 2101  . oxymetazoline (AFRIN) 0.05 % nasal spray 2 spray  2 spray Each Nare BID PRN Blount, Scarlette Shorts T, NP      . pantoprazole (PROTONIX) EC tablet 40 mg  40 mg Oral Q1200 Domenic Polite, MD   40 mg at 04/24/17 1002    Musculoskeletal: Strength & Muscle Tone: within normal limits Gait & Station: UTA since lying in bed. Patient leans: N/A  Psychiatric Specialty Exam: Physical Exam  Nursing note and vitals reviewed. Constitutional: He is oriented to person, place, and time. He appears well-developed and well-nourished.  HENT:  Head: Normocephalic and atraumatic.  Neck: Normal range of motion.  Respiratory: Effort normal.  Musculoskeletal: Normal range of motion.  Neurological: He is alert and oriented to person, place, and time.  Skin: No rash noted.  Psychiatric: He has a normal mood and affect. His speech is normal and behavior is normal. Judgment and thought content normal. Cognition and memory are normal.    Review of Systems  Constitutional: Negative for chills and fever.  Cardiovascular: Negative for chest pain.  Gastrointestinal: Negative for abdominal pain, constipation, diarrhea, nausea and vomiting.  Psychiatric/Behavioral: Positive for substance abuse. Negative for depression, hallucinations and suicidal ideas. The patient is not nervous/anxious and does not have insomnia.     Blood pressure (!) 200/70, pulse 66, temperature 98.6 F (37 C), temperature source Oral, resp. rate 18, height '5\' 9"'$  (1.753 m), weight 72.6 kg (160 lb 0 oz), SpO2 96 %.Body mass index is 23.63 kg/m.  General Appearance: Well Groomed, elderly, Caucasian male who is wearing a hospital gown and lying in bed. He appears younger than his stated age. NAD.    Eye Contact:  Good  Speech:  Clear and Coherent and Normal Rate  Volume:  Normal  Mood:  Euthymic  Affect:  Appropriate and Full Range  Thought Process:  Goal Directed and Linear  Orientation:  Full (Time, Place, and Person)  Thought Content:  Logical  Suicidal Thoughts:  No  Homicidal Thoughts:  No  Memory:  Immediate;   Good Recent;   Good Remote;   Good  Judgement:  Good  Insight:  Good  Psychomotor Activity:  Normal  Concentration:  Concentration: Good and Attention Span: Good  Recall:  Good  Fund of Knowledge:  Good  Language:  Good  Akathisia:  No  Handed:  Right  AIMS (if indicated):   N/A  Assets:  Agricultural consultant Housing Social Support  ADL's:  Intact  Cognition:  WNL  Sleep:   Okay   Assessment: JOWELL BOSSI is a 83 y.o. male who was admitted following an overdose of Percocet after an argument with his wife. He reports fleeting suicidal thoughts following arguments with his wife but he has never acted on them. His suicide attempt appears impulsive in the setting of this argument and alcohol use. He warrants inpatient psychiatric hospitalization due to the severity of his attempt. He will benefit from learning healthy coping skills.   Treatment Plan Summary: -Patient warrants inpatient psychiatric hospitalization given high risk of harm to self. -Continue Engineer, materials.  -Continue home medications: Celexa 20 mg daily for depression.  -Please pursue involuntary commitment if patient refuses voluntary psychiatric hospitalization or attempts to leave the hospital.  -Will sign off on patient at this time. Please consult psychiatry again as needed.    Disposition: Recommend psychiatric Inpatient admission when medically  cleared.  Faythe Dingwall, DO 04/24/2017 11:53 AM

## 2017-04-24 NOTE — Progress Notes (Addendum)
PROGRESS NOTE    Peter Velasquez  CZY:[id number] DOB: 17-Aug-1933 DOA: 04/23/2017 PCP: [name], MD  Brief Narrative:Peter Velasquez is a 83 y.o. male with medical history significant of hypertension, hypothyroidism, COPD, chronic back pain was brought to the emergency room by EMS after he was found obtunded by his wife with a suicide note by the bedside he reportedly ingested approximately 30 tablets of Percocet last night between 10 and 12 PM,  ED Course: Workup in the emergency room noted normal LFTs, Tylenol level elevated at 31 at 14 hour mark which was reportedly call to poison control and it was recommended to start him on an acetylcysteine. NAC infusing, Psych consult pending  Assessment & Plan:    Suicide attempt (HCC)/Percocet overdose -elevated tylenol level on admission, now <10 -suicide precautions, one-to-one sitter -N-acetyl cysteine per poison control recommendations, will repeat tylenol level and LFTS at 1pm  -psych consult pending -advance diet  Hypertension. -uncontrolled -resume benazepril, continue atenolol -hydralazine PRN   Hypothyroidism -Continue Synthroid  Major depression -As above, continue Celexa -Psych consulted   DVT prophylaxis: lovenox Code Status: Full Code Family Communication: none at bedside, called and d/w wife Ms.Ingleside Disposition Plan: will likely need psych admission pending completion of [facility] Rx    Consultants:   Psych pending   Procedures:   Antimicrobials:    Subjective: Feels ok, mild nausea, no vomiting  Objective: Vitals:   04/23/17 1822 04/23/17 1959 04/24/17 0542 04/24/17 0902  BP:  (!) 178/72 (!) 200/70   Pulse:  74 67 66  Resp:   18   Temp: 98.9 F (37.2 C)  98.6 F (37 C)   TempSrc: Oral  Oral   SpO2:   96%   Weight:      Height:        Intake/Output Summary (Last 24 hours) at 04/24/2017 1114 Last data filed at 04/24/2017 0545 Gross per 24 hour  Intake 374.01 ml  Output 300 ml  Net  74.01 ml   Filed Weights   04/23/17 1256 04/23/17 1558  Weight: 72.6 kg (160 lb) 72.6 kg (160 lb 0 oz)    Examination:  General exam: Appears calm and comfortable  Respiratory system: Clear to auscultation. Respiratory effort normal. Cardiovascular system: S1 & S2 heard, RRR. No JVD, murmurs, rubs, gallops  Gastrointestinal system: Abdomen is nondistended, soft and nontender.Normal bowel sounds heard. Central nervous system: Alert and oriented. No focal neurological deficits. Extremities: Symmetric 5 x 5 power. Skin: No rashes, lesions or ulcers Psychiatry: flat affect    Data Reviewed:   CBC: Recent Labs  Lab 04/23/17 1345 04/24/17 0431  WBC 13.8* 12.1*  HGB 14.6 13.6  HCT 46.1 42.5  MCV 100.9* 97.3  PLT 164 093   Basic Metabolic Panel: Recent Labs  Lab 04/23/17 1345 04/24/17 0431  NA 141 140  K 4.1 3.6  CL 107 106  CO2 25 26  GLUCOSE 136* 123*  BUN 18 18  CREATININE 1.31* 1.04  CALCIUM 8.5* 8.4*   GFR: Estimated Creatinine Clearance: 55.7 mL/min (by C-G formula based on SCr of 1.04 mg/dL). Liver Function Tests: Recent Labs  Lab 04/23/17 1345 04/24/17 0431  AST 36 46*  ALT 20 22  ALKPHOS 65 57  BILITOT 0.3 0.6  PROT 6.9 6.1*  ALBUMIN 4.0 3.3*   No results for input(s): LIPASE, AMYLASE in the last 168 hours. No results for input(s): AMMONIA in the last 168 hours. Coagulation Profile: Recent Labs  Lab 04/23/17 1345  INR 0.93  Cardiac Enzymes: No results for input(s): CKTOTAL, CKMB, CKMBINDEX, TROPONINI in the last 168 hours. BNP (last 3 results) No results for input(s): PROBNP in the last 8760 hours. HbA1C: No results for input(s): HGBA1C in the last 72 hours. CBG: No results for input(s): GLUCAP in the last 168 hours. Lipid Profile: No results for input(s): CHOL, HDL, LDLCALC, TRIG, CHOLHDL, LDLDIRECT in the last 72 hours. Thyroid Function Tests: No results for input(s): TSH, T4TOTAL, FREET4, T3FREE, THYROIDAB in the last 72  hours. Anemia Panel: No results for input(s): VITAMINB12, FOLATE, FERRITIN, TIBC, IRON, RETICCTPCT in the last 72 hours. Urine analysis: No results found for: COLORURINE, APPEARANCEUR, LABSPEC, PHURINE, GLUCOSEU, HGBUR, BILIRUBINUR, KETONESUR, PROTEINUR, UROBILINOGEN, NITRITE, LEUKOCYTESUR Sepsis Labs: @LABRCNTIP (procalcitonin:4,lacticidven:4)  )No results found for this or any previous visit (from the past 240 hour(s)).       Radiology Studies: No results found.      Scheduled Meds: . amLODipine  10 mg Oral Daily  . atenolol  50 mg Oral Daily  . citalopram  20 mg Oral Daily  . enoxaparin (LOVENOX) injection  40 mg Subcutaneous Q24H  . levothyroxine  137 mcg Oral QAC breakfast  . mouth rinse  15 mL Mouth Rinse BID  . pantoprazole  40 mg Oral Q1200   Continuous Infusions: . sodium chloride 20 mL (04/23/17 1348)  . acetylcysteine 15 mg/kg/hr (04/23/17 1620)     LOS: 1 day    Time spent: 56min    Domenic Polite, MD [facility] Page via [email or web address], password [email or web address] After 7PM please contact night-coverage  04/24/2017, 11:14 AM

## 2017-04-24 NOTE — Progress Notes (Signed)
LCSW following for inpatient psych placement.  According to pharmacy note patient is not medically ready and will be monitored on current meds for another 24hrs.   LCSW will continue to follow for placement.   Carolin Coy [facility] [place]

## 2017-04-25 DIAGNOSIS — R45851 Suicidal ideations: Secondary | ICD-10-CM

## 2017-04-25 LAB — COMPREHENSIVE METABOLIC PANEL
ALBUMIN: 3.1 g/dL — AB (ref 3.5–5.0)
ALK PHOS: 65 U/L (ref 38–126)
ALT: 27 U/L (ref 17–63)
AST: 26 U/L (ref 15–41)
Anion gap: 9 (ref 5–15)
BUN: 13 mg/dL (ref 6–20)
CALCIUM: 8.7 mg/dL — AB (ref 8.9–10.3)
CHLORIDE: 105 mmol/L (ref 101–111)
CO2: 24 mmol/L (ref 22–32)
CREATININE: 0.95 mg/dL (ref 0.61–1.24)
GFR calc non Af Amer: >60 mL/min (ref >60)
GLUCOSE: 129 mg/dL — AB (ref 65–99)
Potassium: 3.3 mmol/L — ABNORMAL LOW (ref 3.5–5.1)
SODIUM: 138 mmol/L (ref 135–145)
Total Bilirubin: 0.9 mg/dL (ref 0.3–1.2)
Total Protein: 6 g/dL — ABNORMAL LOW (ref 6.5–8.1)

## 2017-04-25 LAB — PROTIME-INR
INR: 1.14
Prothrombin Time: 14.5 seconds (ref 11.4–15.2)

## 2017-04-25 MED ORDER — HYDROCHLOROTHIAZIDE 25 MG PO TABS
25.0000 mg | ORAL_TABLET | Freq: Every day | ORAL | Status: DC
  Administered 2017-04-25 – 2017-04-29 (×5): 25 mg via ORAL
  Filled 2017-04-25 (×5): qty 1

## 2017-04-25 MED ORDER — POTASSIUM CHLORIDE CRYS ER 20 MEQ PO TBCR
40.0000 meq | EXTENDED_RELEASE_TABLET | Freq: Every day | ORAL | Status: DC
  Administered 2017-04-25 – 2017-04-29 (×5): 40 meq via ORAL
  Filled 2017-04-25 (×5): qty 2

## 2017-04-25 MED ORDER — VITAMIN B-1 100 MG PO TABS
100.0000 mg | ORAL_TABLET | Freq: Every day | ORAL | Status: DC
  Administered 2017-04-26 – 2017-04-29 (×4): 100 mg via ORAL
  Filled 2017-04-25 (×4): qty 1

## 2017-04-25 NOTE — Progress Notes (Signed)
[facility] PROGRESS NOTE  Peter Velasquez HDQ:[id number] DOB: 26-Mar-1934 DOA: 04/23/2017 PCP: [name], MD  Brief summary   83 y.o.malewith medical history significant ofhypertension, hypothyroidism, COPD, chronic back pain was brought to the emergency room by EMS after he was found obtunded by his wife with a suicide note by the bedside he reportedly ingested approximately 30 tablets of Percocet last night between 10 and 12 PM  ED Course:Workup in the emergency room noted normal LFTs, Tylenol level elevated at 31 at 14 hour mark which was reportedly call to poison control and it was recommended to start him on an acetylcysteine. NAC infusing, Psych consulted   Assessment/Plan:  Suicide attempt (HCC)/Percocet overdose. elevated tylenol level on admission, now <10. Received N acetylcysteine. LFTs/bili/INR: stable. Will repeat labs AM -cont suicide precautions, one-to-one sitter. psych recommended inpatient psychiatry treatment   Major depression. As above, continue Celexa. Psych recommended inpatient treatment   Hypertension. Uncontrolled. resume benazepril, continue atenolol, added amlodipine, resume hctz. hydralazine PRN  Hypothyroidism. Continue Synthroid   Code Status: dnr Family Communication: d/w patient, rn (indicate person spoken with, relationship, and if by phone, the number) Disposition Plan: likely inpatient psychiatry. 24-48 hrs    Consultants:  psychiatry   Procedures:  none  Antibiotics:  noen (indicate start date, and stop date if known)  HPI/Subjective: Alert. Reports feeling better, no more nausea. No vomiting,. No abdominal pains. .   Objective: Vitals:   04/25/17 0559 04/25/17 0849  BP: (!) 168/70 (!) 174/90  Pulse: 64 67  Resp: 17 18  Temp:    SpO2: 96% 98%    Intake/Output Summary (Last 24 hours) at 04/25/2017 1125 Last data filed at 04/25/2017 1058 Gross per 24 hour  Intake 1508.69 ml  Output -  Net 1508.69 ml    Filed Weights   04/23/17 1256 04/23/17 1558  Weight: 72.6 kg (160 lb) 72.6 kg (160 lb 0 oz)    Exam:   General:  No distress   Cardiovascular: s1,s2 rrr  Respiratory: CTA BL  Abdomen: soft, nt, nd   Musculoskeletal: no leg edema    Data Reviewed: Basic Metabolic Panel: Recent Labs  Lab 04/23/17 1345 04/24/17 0431 04/24/17 1244 04/25/17 0040  NA 141 140 140 138  K 4.1 3.6 3.7 3.3*  CL 107 106 103 105  CO2 25 26 28 24   GLUCOSE 136* 123* 114* 129*  BUN 18 18 16 13   CREATININE 1.31* 1.04 1.14 0.95  CALCIUM 8.5* 8.4* 8.7* 8.7*   Liver Function Tests: Recent Labs  Lab 04/23/17 1345 04/24/17 0431 04/24/17 1244 04/25/17 0040  AST 36 46* 57* 26  ALT 20 22 34 27  ALKPHOS 65 57 79 65  BILITOT 0.3 0.6 0.7 0.9  PROT 6.9 6.1* 6.6 6.0*  ALBUMIN 4.0 3.3* 3.5 3.1*   No results for input(s): LIPASE, AMYLASE in the last 168 hours. No results for input(s): AMMONIA in the last 168 hours. CBC: Recent Labs  Lab 04/23/17 1345 04/24/17 0431  WBC 13.8* 12.1*  HGB 14.6 13.6  HCT 46.1 42.5  MCV 100.9* 97.3  PLT 164 162   Cardiac Enzymes: No results for input(s): CKTOTAL, CKMB, CKMBINDEX, TROPONINI in the last 168 hours. BNP (last 3 results) No results for input(s): BNP in the last 8760 hours.  ProBNP (last 3 results) No results for input(s): PROBNP in the last 8760 hours.  CBG: No results for input(s): GLUCAP in the last 168 hours.  No results found for this or any previous visit (from  the past 240 hour(s)).   Studies: No results found.  Scheduled Meds: . amLODipine  10 mg Oral Daily  . atenolol  50 mg Oral Daily  . benazepril  20 mg Oral Daily  . citalopram  20 mg Oral Daily  . enoxaparin (LOVENOX) injection  40 mg Subcutaneous Q24H  . levothyroxine  137 mcg Oral QAC breakfast  . mouth rinse  15 mL Mouth Rinse BID  . pantoprazole  40 mg Oral Q1200  . thiamine injection  100 mg Intravenous Daily   Continuous Infusions: . sodium chloride 20 mL  (04/23/17 1348)  . acetylcysteine 15 mg/kg/hr (04/24/17 1716)    Principal Problem:   Adjustment disorder with mixed disturbance of emotions and conduct Active Problems:   Hypothyroidism   Suicide attempt ([place])   Overdose of acetaminophen   Overdose    Time spent: >35 minutes     Kinnie Feil  [facility] Pager [phone]. If 7PM-7AM, please contact night-coverage at [email or web address], password [facility] 04/25/2017, 11:25 AM  LOS: 2 days

## 2017-04-25 NOTE — Clinical Social Work Note (Signed)
CSW faxed pt referral through the [facility] to the following [place] psych facilities: [facility], [place], [facility], and [facility]. CSW continuing to follow for discharge needs.   Oretha Ellis, LCSWA, [place] Clinical Social Worker (Weekend Coverage) [phone]

## 2017-04-25 NOTE — Progress Notes (Signed)
Poison Control updated on patient's improved LFTs. Patient continues to be A&O, resting comfortably in bed at this time. VSS. Sitter at bedside.

## 2017-04-25 NOTE — Progress Notes (Signed)
PHARMACIST - PHYSICIAN COMMUNICATION  DR:   Daleen Bo  CONCERNING: IV to Oral Route Change Policy  RECOMMENDATION: This patient is receiving thiamine by the intravenous route.  Based on criteria approved by the Pharmacy and Therapeutics Committee, the intravenous medication(s) is/are being converted to the equivalent oral dose form(s).   DESCRIPTION: These criteria include:  The patient is eating (either orally or via tube) and/or has been taking other orally administered medications for a least 24 hours  The patient has no evidence of active gastrointestinal bleeding or impaired GI absorption (gastrectomy, short bowel, patient on TNA or NPO).  If you have questions about this conversion, please contact the Pharmacy Department  []   [phone] )  [facility] []   ( [phone] )  [facility] []   [phone] )  [facility] []   [phone] )  [facility] [x]   [phone] )  [place], [place], [facility] 04/25/2017 11:39 AM

## 2017-04-26 DIAGNOSIS — E039 Hypothyroidism, unspecified: Secondary | ICD-10-CM

## 2017-04-26 LAB — COMPREHENSIVE METABOLIC PANEL
ALBUMIN: 3.2 g/dL — AB (ref 3.5–5.0)
ALK PHOS: 60 U/L (ref 38–126)
ALT: 18 U/L (ref 17–63)
AST: 14 U/L — AB (ref 15–41)
Anion gap: 7 (ref 5–15)
BILIRUBIN TOTAL: 0.8 mg/dL (ref 0.3–1.2)
BUN: 15 mg/dL (ref 6–20)
CALCIUM: 8.9 mg/dL (ref 8.9–10.3)
CO2: 30 mmol/L (ref 22–32)
Chloride: 105 mmol/L (ref 101–111)
Creatinine, Ser: 1.1 mg/dL (ref 0.61–1.24)
GFR calc Af Amer: >60 mL/min (ref >60)
GFR calc non Af Amer: >60 mL/min (ref >60)
GLUCOSE: 95 mg/dL (ref 65–99)
Potassium: 3.2 mmol/L — ABNORMAL LOW (ref 3.5–5.1)
Sodium: 142 mmol/L (ref 135–145)
TOTAL PROTEIN: 6.1 g/dL — AB (ref 6.5–8.1)

## 2017-04-26 NOTE — Progress Notes (Signed)
Patient ID: Peter Velasquez, male   DOB: 1934-02-10, 83 y.o.   MRN: [id number]  PROGRESS NOTE    Peter Velasquez  [id number] DOB: 12/03/1933 DOA: 04/23/2017  PCP: [name], MD   Brief Narrative:   83 y.o.malewith medical history significant ofhypertension, hypothyroidism, COPD, chronic back pain was brought to the emergency room by EMS after he was foundobtunded by his wife with a suicide note by the bedside he reportedly ingested approximately 30 tablets of Percocet. Workup in the emergency room noted normal LFTs, Tylenol level elevated at 31 at 14 hour mark which was reportedly call to poison control and it was recommended to start him on an acetylcysteine. Psych consulted   Assessment & Plan:   Suicide attempt (HCC)/Percocet overdose - Elevated tylenol on admission - Received NAC - Continue suicide precaution - Inpatient psych treatment   Major depression - Psych recommended inpatient treatment   Hypokalemia - Supplemented  Essential hypertension - Continue current meds:Norvasc, atenolol, Lotensin, Hctz  Hypothyroidism - Continue synthroid     DVT prophylaxis: Lovenox subQ Code Status: DNR/DNI Family Communication: no family at the bedside Disposition Plan: not yet stable for discharge    Consultants:   Psych  Procedures:   None   Antimicrobials:   None    Subjective: No overnight events.  Objective: Vitals:   04/25/17 0849 04/25/17 1229 04/25/17 2100 04/26/17 0646  BP: (!) 174/90 (!) 146/69 (!) 165/70 (!) 162/67  Pulse: 67 (!) 59 65 (!) 53  Resp: 18 18 17 16   Temp:      TempSrc:      SpO2: 98% 98% 98% 99%  Weight:      Height:        Intake/Output Summary (Last 24 hours) at 04/26/2017 0928 Last data filed at 04/25/2017 1700 Gross per 24 hour  Intake 347.89 ml  Output -  Net 347.89 ml   Filed Weights   04/23/17 1256 04/23/17 1558  Weight: 72.6 kg (160 lb) 72.6 kg (160 lb 0 oz)    Examination:  General exam: Appears  calm and comfortable  Respiratory system: Clear to auscultation. Respiratory effort normal. Cardiovascular system: S1 & S2 heard, RRR. No JVD, murmurs, rubs, gallops or clicks. No pedal edema. Gastrointestinal system: Abdomen is nondistended, soft and nontender. No organomegaly or masses felt. Normal bowel sounds heard. Central nervous system: Little drowsy, no focal deficits  Extremities: Symmetric 5 x 5 power. Skin: No rashes, lesions or ulcers Psychiatry: Not agitated or restless   Data Reviewed: I have personally reviewed following labs and imaging studies  CBC: Recent Labs  Lab 04/23/17 1345 04/24/17 0431  WBC 13.8* 12.1*  HGB 14.6 13.6  HCT 46.1 42.5  MCV 100.9* 97.3  PLT 164 536   Basic Metabolic Panel: Recent Labs  Lab 04/23/17 1345 04/24/17 0431 04/24/17 1244 04/25/17 0040 04/26/17 0534  NA 141 140 140 138 142  K 4.1 3.6 3.7 3.3* 3.2*  CL 107 106 103 105 105  CO2 25 26 28 24 30   GLUCOSE 136* 123* 114* 129* 95  BUN 18 18 16 13 15   CREATININE 1.31* 1.04 1.14 0.95 1.10  CALCIUM 8.5* 8.4* 8.7* 8.7* 8.9   GFR: Estimated Creatinine Clearance: 52.7 mL/min (by C-G formula based on SCr of 1.1 mg/dL). Liver Function Tests: Recent Labs  Lab 04/23/17 1345 04/24/17 0431 04/24/17 1244 04/25/17 0040 04/26/17 0534  AST 36 46* 57* 26 14*  ALT 20 22 34 27 18  ALKPHOS 65 57 79 65 60  BILITOT 0.3 0.6 0.7 0.9 0.8  PROT 6.9 6.1* 6.6 6.0* 6.1*  ALBUMIN 4.0 3.3* 3.5 3.1* 3.2*   No results for input(s): LIPASE, AMYLASE in the last 168 hours. No results for input(s): AMMONIA in the last 168 hours. Coagulation Profile: Recent Labs  Lab 04/23/17 1345 04/25/17 0040  INR 0.93 1.14   Cardiac Enzymes: No results for input(s): CKTOTAL, CKMB, CKMBINDEX, TROPONINI in the last 168 hours. BNP (last 3 results) No results for input(s): PROBNP in the last 8760 hours. HbA1C: No results for input(s): HGBA1C in the last 72 hours. CBG: No results for input(s): GLUCAP in the last  168 hours. Lipid Profile: No results for input(s): CHOL, HDL, LDLCALC, TRIG, CHOLHDL, LDLDIRECT in the last 72 hours. Thyroid Function Tests: No results for input(s): TSH, T4TOTAL, FREET4, T3FREE, THYROIDAB in the last 72 hours. Anemia Panel: No results for input(s): VITAMINB12, FOLATE, FERRITIN, TIBC, IRON, RETICCTPCT in the last 72 hours. Urine analysis: No results found for: COLORURINE, APPEARANCEUR, LABSPEC, PHURINE, GLUCOSEU, HGBUR, BILIRUBINUR, KETONESUR, PROTEINUR, UROBILINOGEN, NITRITE, LEUKOCYTESUR Sepsis Labs: @LABRCNTIP (procalcitonin:4,lacticidven:4)   )No results found for this or any previous visit (from the past 240 hour(s)).    Radiology Studies: No results found.   Scheduled Meds: . amLODipine  10 mg Oral Daily  . atenolol  50 mg Oral Daily  . benazepril  20 mg Oral Daily  . citalopram  20 mg Oral Daily  . enoxaparin   40 mg Subcutaneous Q24H  . hydrochlorothiazide  25 mg Oral Daily  . levothyroxine  137 mcg Oral QAC breakfast  . pantoprazole  40 mg Oral Q1200  . potassium chloride  40 mEq Oral Daily  . thiamine  100 mg Oral Daily   Continuous Infusions: . sodium chloride 20 mL (04/23/17 1348)     LOS: 3 days    Time spent: 25 minutes  Greater than 50% of the time spent on counseling and coordinating the care.   Leisa Lenz, MD [facility] Pager [phone]  If 7PM-7AM, please contact night-coverage [email or web address] Password [facility] 04/26/2017, 9:28 AM

## 2017-04-26 NOTE — Plan of Care (Signed)
  Progressing Spiritual Needs Ability to function at adequate level 04/26/2017 2256 - Progressing by Talbert Forest, RN Clinical Measurements: Ability to maintain clinical measurements within normal limits will improve 04/26/2017 2256 - Progressing by Talbert Forest, RN Will remain free from infection 04/26/2017 2256 - Progressing by Talbert Forest, RN Diagnostic test results will improve 04/26/2017 2256 - Progressing by Talbert Forest, RN Respiratory complications will improve 04/26/2017 2256 - Progressing by Talbert Forest, RN Cardiovascular complication will be avoided 04/26/2017 2256 - Progressing by Talbert Forest, RN Activity: Risk for activity intolerance will decrease 04/26/2017 2256 - Progressing by Talbert Forest, RN Nutrition: Adequate nutrition will be maintained 04/26/2017 2256 - Progressing by Talbert Forest, RN Coping: Level of anxiety will decrease 04/26/2017 2256 - Progressing by Talbert Forest, RN Elimination: Will not experience complications related to urinary retention 04/26/2017 2256 - Progressing by Talbert Forest, RN Pain Managment: General experience of comfort will improve 04/26/2017 2256 - Progressing by Talbert Forest, RN Safety: Ability to remain free from injury will improve 04/26/2017 2256 - Progressing by Talbert Forest, RN Skin Integrity: Risk for impaired skin integrity will decrease 04/26/2017 2256 - Progressing by Talbert Forest, RN

## 2017-04-27 DIAGNOSIS — F4325 Adjustment disorder with mixed disturbance of emotions and conduct: Secondary | ICD-10-CM

## 2017-04-27 LAB — BASIC METABOLIC PANEL
ANION GAP: 6 (ref 5–15)
BUN: 18 mg/dL (ref 6–20)
CALCIUM: 9.1 mg/dL (ref 8.9–10.3)
CO2: 29 mmol/L (ref 22–32)
CREATININE: 1.18 mg/dL (ref 0.61–1.24)
Chloride: 102 mmol/L (ref 101–111)
GFR, EST NON AFRICAN AMERICAN: 56 mL/min — AB (ref >60)
GLUCOSE: 98 mg/dL (ref 65–99)
Potassium: 3.8 mmol/L (ref 3.5–5.1)
Sodium: 137 mmol/L (ref 135–145)

## 2017-04-27 LAB — CBC
HCT: 44.4 % (ref 39.0–52.0)
Hemoglobin: 14.3 g/dL (ref 13.0–17.0)
MCH: 31.6 pg (ref 26.0–34.0)
MCHC: 32.2 g/dL (ref 30.0–36.0)
MCV: 98.2 fL (ref 78.0–100.0)
PLATELETS: 155 10*3/uL (ref 150–400)
RBC: 4.52 MIL/uL (ref 4.22–5.81)
RDW: 14.5 % (ref 11.5–15.5)
WBC: 8 10*3/uL (ref 4.0–10.5)

## 2017-04-27 NOTE — Progress Notes (Signed)
Pt daughter called RN after speaking to Dr. Adele Schilder. She would like the social worker to call her at [phone] before patient is discharged to behavioral health. Pt family is concerned about the placement in [place] and would rather the patient be at [facility] if possible. His daughter lives in [place] and says anyone can certainly call her collect at any time. RN will pass this information on to oncoming RN to pass to day shift RN for tomorrow 12/25 to make social worker aware.  Carmela Hurt, RN

## 2017-04-27 NOTE — Plan of Care (Signed)
  Progressing Clinical Measurements: Ability to maintain clinical measurements within normal limits will improve 04/27/2017 2227 - Progressing by Talbert Forest, RN Will remain free from infection 04/27/2017 2227 - Progressing by Talbert Forest, RN Diagnostic test results will improve 04/27/2017 2227 - Progressing by Talbert Forest, RN Respiratory complications will improve 04/27/2017 2227 - Progressing by Talbert Forest, RN Cardiovascular complication will be avoided 04/27/2017 2227 - Progressing by Talbert Forest, RN Activity: Risk for activity intolerance will decrease 04/27/2017 2227 - Progressing by Talbert Forest, RN Nutrition: Adequate nutrition will be maintained 04/27/2017 2227 - Progressing by Talbert Forest, RN Coping: Level of anxiety will decrease 04/27/2017 2227 - Progressing by Talbert Forest, RN Elimination: Will not experience complications related to urinary retention 04/27/2017 2227 - Progressing by Talbert Forest, RN Pain Managment: General experience of comfort will improve 04/27/2017 2227 - Progressing by Talbert Forest, RN Safety: Ability to remain free from injury will improve 04/27/2017 2227 - Progressing by Talbert Forest, RN Skin Integrity: Risk for impaired skin integrity will decrease 04/27/2017 2227 - Progressing by Talbert Forest, RN

## 2017-04-27 NOTE — Consult Note (Signed)
[facility] Face-to-Face Follow Up Psychiatry Consult   Patient Identification: Peter Velasquez MRN:  [id number] Principal Diagnosis: Adjustment disorder with mixed disturbance of emotions and conduct Diagnosis:   Patient Active Problem List   Diagnosis Date Noted  . Adjustment disorder with mixed disturbance of emotions and conduct [F43.25] 04/24/2017  . Suicide attempt ([place]) [T14.91XA] 04/23/2017  . Overdose of acetaminophen [T39.1X1A] 04/23/2017  . Overdose [T50.901A] 04/23/2017  . BENIGN PROSTATIC HYPERTROPHY [N40.0] 01/13/2007  . Hypothyroidism [E03.9] 01/09/2007  . HYPERLIPIDEMIA [E78.5] 01/09/2007  . ERECTILE DYSFUNCTION [F52.8] 01/09/2007  . DEPRESSION [F32.9] 01/09/2007  . 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 COPD [J44.9] 01/09/2007  . GERD [K21.9] 01/09/2007  . DIVERTICULOSIS, COLON [K57.30] 01/09/2007  . DEGENERATIVE JOINT DISEASE [M19.90] 01/09/2007  . PALPITATIONS, HX OF [Z86.79] 01/09/2007    Total Time spent with patient: 20 minutes  Subjective:   Peter Velasquez is a 83 y.o. male patient admitted with suicide attempt by overdose with Percocet.  HPI:   Patient seen and chart reviewed.  Patient remains very sad depressed and withdrawn.  Though he regret about his suicidal attempt but continued to feel depressed and feels hopeless and worthless.  He is frustrated that he is unable to transfer to the behavioral health center or any other psych hospital.  He endorsed poor sleep, racing thoughts and anhedonia.  He admitted relationship issues with his wife.  Patient is still desired to go inpatient treatment when bed is available.  Past Psychiatric History: Depression   Risk to Self: Is patient at risk for suicide?: Yes Risk to Others:  None. Denies HI. Prior Inpatient Therapy:  Denies  Prior Outpatient Therapy:  He is prescribed Celexa by his PCP.   Past Medical History:  Past Medical History:  Diagnosis Date  . BENIGN PROSTATIC HYPERTROPHY 01/13/2007   Qualifier: Diagnosis of   By: Jenny Reichmann MD, Hunt Oris   . COPD 01/09/2007   Qualifier: Diagnosis of  By: Elveria Royals   . DEGENERATIVE JOINT DISEASE 01/09/2007   Qualifier: Diagnosis of  By: Elveria Royals   . DEPRESSION 01/09/2007   Qualifier: Diagnosis of  By: Elveria Royals   . DIVERTICULOSIS, COLON 01/09/2007   Qualifier: Diagnosis of  By: Elveria Royals   . ERECTILE DYSFUNCTION 01/09/2007   Qualifier: Diagnosis of  By: Elveria Royals   . GERD 01/09/2007   Qualifier: Diagnosis of  By: Elveria Royals   . HYPERLIPIDEMIA 01/09/2007   Qualifier: Diagnosis of  By: Elveria Royals   . HYPOTHYROIDISM 01/09/2007   Qualifier: Diagnosis of  By: Elveria Royals   . PALPITATIONS, HX OF 01/09/2007   Qualifier: Diagnosis of  By: Elveria Royals   . TINNITUS 01/09/2007   Qualifier: Diagnosis of  By: Elveria Royals    History reviewed. No pertinent surgical history. Family History:  Family History  Problem Relation Age of Onset  . Stroke Father   . Hypertension Father   . Other Sister    Family Psychiatric  History: His son committed suicide. He was a substance abuser.   Social History:  Social History   Substance and Sexual Activity  Alcohol Use No     Social History   Substance and Sexual Activity  Drug Use No    Social History   Socioeconomic History  . Marital status: Married    Spouse name: None  . Number of children: None  . Years of education: None  . Highest education level: None  Social  Needs  . Financial resource strain: None  . Food insecurity - worry: None  . Food insecurity - inability: None  . Transportation needs - medical: None  . Transportation needs - non-medical: None  Occupational History  . None  Tobacco Use  . Smoking status: Former Smoker    Last attempt to quit: 01/23/1998    Years since quitting: 19.2  . Smokeless tobacco: Never Used  Substance and Sexual Activity  . Alcohol use: No  . Drug use: No  .  Sexual activity: No  Other Topics Concern  . None  Social History Narrative  . None       Allergies:  No Known Allergies  Labs:  Results for orders placed or performed during the hospital encounter of 04/23/17 (from the past 48 hour(s))  Comprehensive metabolic panel     Status: Abnormal   Collection Time: 04/26/17  5:34 AM  Result Value Ref Range   Sodium 142 135 - 145 mmol/L   Potassium 3.2 (L) 3.5 - 5.1 mmol/L   Chloride 105 101 - 111 mmol/L   CO2 30 22 - 32 mmol/L   Glucose, Bld 95 65 - 99 mg/dL   BUN 15 6 - 20 mg/dL   Creatinine, Ser 1.10 0.61 - 1.24 mg/dL   Calcium 8.9 8.9 - 10.3 mg/dL   Total Protein 6.1 (L) 6.5 - 8.1 g/dL   Albumin 3.2 (L) 3.5 - 5.0 g/dL   AST 14 (L) 15 - 41 U/L   ALT 18 17 - 63 U/L   Alkaline Phosphatase 60 38 - 126 U/L   Total Bilirubin 0.8 0.3 - 1.2 mg/dL   GFR calc non Af Amer >60 >60 mL/min   GFR calc Af Amer >60 >60 mL/min    Comment: (NOTE) The eGFR has been calculated using the CKD EPI equation. This calculation has not been validated in all clinical situations. eGFR's persistently <60 mL/min signify possible Chronic Kidney Disease.    Anion gap 7 5 - 15  CBC     Status: None   Collection Time: 04/27/17  4:31 AM  Result Value Ref Range   WBC 8.0 4.0 - 10.5 K/uL   RBC 4.52 4.22 - 5.81 MIL/uL   Hemoglobin 14.3 13.0 - 17.0 g/dL   HCT 44.4 39.0 - 52.0 %   MCV 98.2 78.0 - 100.0 fL   MCH 31.6 26.0 - 34.0 pg   MCHC 32.2 30.0 - 36.0 g/dL   RDW 14.5 11.5 - 15.5 %   Platelets 155 150 - 400 K/uL  Basic metabolic panel     Status: Abnormal   Collection Time: 04/27/17  4:31 AM  Result Value Ref Range   Sodium 137 135 - 145 mmol/L   Potassium 3.8 3.5 - 5.1 mmol/L   Chloride 102 101 - 111 mmol/L   CO2 29 22 - 32 mmol/L   Glucose, Bld 98 65 - 99 mg/dL   BUN 18 6 - 20 mg/dL   Creatinine, Ser 1.18 0.61 - 1.24 mg/dL   Calcium 9.1 8.9 - 10.3 mg/dL   GFR calc non Af Amer 56 (L) >60 mL/min   GFR calc Af Amer >60 >60 mL/min    Comment:  (NOTE) The eGFR has been calculated using the CKD EPI equation. This calculation has not been validated in all clinical situations. eGFR's persistently <60 mL/min signify possible Chronic Kidney Disease.    Anion gap 6 5 - 15    Current Facility-Administered Medications  Medication Dose Route Frequency  Provider Last Rate Last Dose  . 0.9 %  sodium chloride infusion   Intravenous Continuous Lajean Saver, MD 20 mL/hr at 04/23/17 1348 20 mL at 04/23/17 1348  . amLODipine (NORVASC) tablet 10 mg  10 mg Oral Daily Domenic Polite, MD   10 mg at 04/27/17 0913  . atenolol (TENORMIN) tablet 50 mg  50 mg Oral Daily Domenic Polite, MD   50 mg at 04/27/17 0913  . benazepril (LOTENSIN) tablet 20 mg  20 mg Oral Daily Domenic Polite, MD   20 mg at 04/27/17 0913  . citalopram (CELEXA) tablet 20 mg  20 mg Oral Daily Domenic Polite, MD   20 mg at 04/27/17 0912  . enoxaparin (LOVENOX) injection 40 mg  40 mg Subcutaneous Q24H Domenic Polite, MD   40 mg at 04/26/17 2104  . hydrALAZINE (APRESOLINE) injection 10 mg  10 mg Intravenous Q6H PRN Domenic Polite, MD   10 mg at 04/24/17 2212  . hydrochlorothiazide (HYDRODIURIL) tablet 25 mg  25 mg Oral Daily Kinnie Feil, MD   25 mg at 04/27/17 0912  . levothyroxine (SYNTHROID, LEVOTHROID) tablet 137 mcg  137 mcg Oral QAC breakfast Domenic Polite, MD   137 mcg at 04/27/17 0913  . MEDLINE mouth rinse  15 mL Mouth Rinse BID Domenic Polite, MD   15 mL at 04/27/17 0913  . ondansetron (ZOFRAN) injection 4 mg  4 mg Intravenous Q6H PRN Lovey Newcomer T, NP   4 mg at 04/24/17 2341  . pantoprazole (PROTONIX) EC tablet 40 mg  40 mg Oral Q1200 Domenic Polite, MD   Stopped at 04/27/17 1200  . potassium chloride SA (K-DUR,KLOR-CON) CR tablet 40 mEq  40 mEq Oral Daily Kinnie Feil, MD   40 mEq at 04/27/17 0912  . thiamine (VITAMIN B-1) tablet 100 mg  100 mg Oral Daily Thomes Lolling, RPH   100 mg at 04/27/17 6237    Musculoskeletal: Strength & Muscle Tone:  within normal limits Gait & Station: lying in bed. Patient leans: N/A  Psychiatric Specialty Exam: Physical Exam  Nursing note and vitals reviewed. Constitutional: He is oriented to person, place, and time. He appears well-developed and well-nourished.  HENT:  Head: Normocephalic and atraumatic.  Neck: Normal range of motion.  Respiratory: Effort normal.  Musculoskeletal: Normal range of motion.  Neurological: He is alert and oriented to person, place, and time.  Skin: No rash noted.  Psychiatric: He has a normal mood and affect. His speech is normal and behavior is normal. Judgment and thought content normal. Cognition and memory are normal.    Review of Systems  Constitutional: Negative for chills and fever.  Cardiovascular: Negative for chest pain.  Gastrointestinal: Negative for abdominal pain, constipation, diarrhea, nausea and vomiting.  Psychiatric/Behavioral: Positive for substance abuse. Negative for depression, hallucinations and suicidal ideas. The patient is not nervous/anxious and does not have insomnia.     Blood pressure (!) 152/82, pulse 60, temperature 98.4 F (36.9 C), temperature source Oral, resp. rate 18, height '5\' 9"'$  (1.753 m), weight 72.6 kg (160 lb 0 oz), SpO2 99 %.Body mass index is 23.63 kg/m.  General Appearance: Casual   Eye Contact:  Fair  Speech:  Clear and Coherent and Normal Rate  Volume:  Normal  Mood:  Depressed and Dysphoric  Affect:  Constricted  Thought Process:  Goal Directed and Linear  Orientation:  Full (Time, Place, and Person)  Thought Content:  Logical  Suicidal Thoughts:  Patient took overdose on meds.  Homicidal  Thoughts:  No  Memory:  Immediate;   Good Recent;   Good Remote;   Good  Judgement:  Good  Insight:  Good  Psychomotor Activity:  Normal  Concentration:  Concentration: Good and Attention Span: Good  Recall:  Good  Fund of Knowledge:  Good  Language:  Good  Akathisia:  No  Handed:  Right  AIMS (if indicated):   N/A   Assets:  Agricultural consultant Housing Social Support  ADL's:  Intact  Cognition:  WNL  Sleep:   Okay   Assessment: Peter Velasquez is a 83 y.o. male who was admitted following an overdose of Percocet after an argument with his wife.  Continue to endorse depression, hopelessness and worthlessness.  He is frustrated because he could not get inpatient bed.  He warrants inpatient psychiatric hospitalization due to the severity of his attempt. He will benefit from learning healthy coping skills.  Patient may go inpatient with IV see if refuses voluntary admission.  Treatment Plan Summary: -Patient warrants inpatient psychiatric hospitalization given high risk of harm to self. -Continue Engineer, materials.  -Continue home medications: Celexa 20 mg daily for depression.  -Please pursue involuntary commitment if patient refuses voluntary psychiatric hospitalization or attempts to leave the hospital.  -Will sign off on patient at this time. Please consult psychiatry again as needed.    Disposition: Recommend psychiatric Inpatient admission when medically cleared.  Kathlee Nations, MD 04/27/2017 11:22 AM

## 2017-04-27 NOTE — Progress Notes (Signed)
Pt and pt wife called RN to come in room to talk to them about plan. The pt was very anxious and wanted to know why he hadn't left the hospital yet. RN explained to him that we were waiting on a bed. Patient got the wrong idea and misunderstood what the hospitalist and psych MD told him today. Psych MD was called due to patient requesting that he "talk to him now" about why he was still in room and not at [facility]. RN talked to MD and reassured patient that he would be staying here until we can get a bed. He will not be d/c home, psych hasn't cleared him for that. Psych did tell RN that if patient was to start acting like he was going to get up and leave to go ahead and IVC patient because he's not cleared. RN tried to get in touch with mental health social work that's working on patient's transfer. Was unable to get in touch. Left a message and number for social worker to call back. Patient's family would like to physician to call the patient's daughter if possible. She is a clinical psychologist, I left her number in the physician sticky notes also her number is in the patients chart. Daughters name is Jonetta Osgood. Pt and patients wife agreed to what RN told them the plan was for now. Will continue to monitor the patient and keep him updated on his status of when he may get to leave to help prevent him from becoming too anxious and trying to leave and having to IVC him. Currently patient is in room calm and cooperative with sitter at bedside.  Carmela Hurt, RN

## 2017-04-27 NOTE — Progress Notes (Signed)
LCSW following for geri psych placement.  LCSW resent referrals to the following geri psych facilities:  [facility] bed availability.  LCSW will continue to follow.  Carolin Coy [facility] [place]

## 2017-04-27 NOTE — Progress Notes (Signed)
Patient ID: Peter Velasquez, male   DOB: [birth year], 83 y.o.   MRN: [id number]  PROGRESS NOTE    Peter Velasquez  TKZ:[id number] DOB: 1933-06-13 DOA: 04/23/2017  PCP: [name], MD   Brief Narrative:   83 y.o.malewith medical history significant ofhypertension, hypothyroidism, COPD, chronic back pain was brought to the emergency room by EMS after he was foundobtunded by his wife with a suicide note by the bedside he reportedly ingested approximately 30 tablets of Percocet. Workup in the emergency room noted normal LFTs, Tylenol level elevated at 31 at 14 hour mark which was reportedly call to poison control and it was recommended to start him on an acetylcysteine. Psych consulted   Assessment & Plan:   Suicide attempt (HCC)/Percocet overdose - Elevated tylenol on admission - Received NAC - Suicide precaution - Feels better this am, will ask psych to re-eval and potentially clear him for d/c home   Major depression - Awaiting psych re-eval  Hypokalemia - Supplemented   Essential hypertension - Continue current meds:Norvasc, atenolol, Lotensin, Hctz - BP stable   Hypothyroidism - Continue synthroid   - Stable    DVT prophylaxis: Lovenox subQ Code Status: DNR/DNI Family Communication: no family at the bedside Disposition Plan: home if cleared by psych    Consultants:   Psych  Procedures:   None   Antimicrobials:   None   Subjective: More alert, feels better.  Objective: Vitals:   04/25/17 2100 04/26/17 0646 04/26/17 1527 04/26/17 2254  BP: (!) 165/70 (!) 162/67 128/64 136/73  Pulse: 65 (!) 53 (!) 56 60  Resp: 17 16 18 18   Temp:   97.6 F (36.4 C) 98.6 F (37 C)  TempSrc:   Oral Oral  SpO2: 98% 99% 99% 98%  Weight:      Height:        Intake/Output Summary (Last 24 hours) at 04/27/2017 5732 Last data filed at 04/27/2017 0600 Gross per 24 hour  Intake 480 ml  Output -  Net 480 ml   Filed Weights   04/23/17 1256 04/23/17 1558    Weight: 72.6 kg (160 lb) 72.6 kg (160 lb 0 oz)    Physical Exam  Constitutional: Appears well-developed and well-nourished. No distress.  HENT: Normocephalic. External right and left ear normal. Oropharynx is clear and moist.  Eyes: Conjunctivae and EOM are normal. PERRLA, no scleral icterus.  Neck: Normal ROM. Neck supple. No JVD. No tracheal deviation. No thyromegaly.  CVS: RRR, S1/S2 +, no murmurs, no gallops, no carotid bruit.  Pulmonary: Effort and breath sounds normal, no stridor, rhonchi, wheezes, rales.  Abdominal: Soft. BS +,  no distension, tenderness, rebound or guarding.  Musculoskeletal: Normal range of motion. No edema and no tenderness.  Lymphadenopathy: No lymphadenopathy noted, cervical, inguinal. Neuro: Alert. Normal reflexes, muscle tone coordination. No cranial nerve deficit. Skin: Skin is warm and dry. No rash noted. Not diaphoretic. No erythema. No pallor.  Psychiatric: Normal mood and affect. Behavior, judgment, thought content normal.     Data Reviewed: I have personally reviewed following labs and imaging studies  CBC: Recent Labs  Lab 04/23/17 1345 04/24/17 0431 04/27/17 0431  WBC 13.8* 12.1* 8.0  HGB 14.6 13.6 14.3  HCT 46.1 42.5 44.4  MCV 100.9* 97.3 98.2  PLT 164 162 202   Basic Metabolic Panel: Recent Labs  Lab 04/24/17 0431 04/24/17 1244 04/25/17 0040 04/26/17 0534 04/27/17 0431  NA 140 140 138 142 137  K 3.6 3.7 3.3* 3.2* 3.8  CL 106  103 105 105 102  CO2 26 28 24 30 29   GLUCOSE 123* 114* 129* 95 98  BUN 18 16 13 15 18   CREATININE 1.04 1.14 0.95 1.10 1.18  CALCIUM 8.4* 8.7* 8.7* 8.9 9.1   GFR: Estimated Creatinine Clearance: 49.1 mL/min (by C-G formula based on SCr of 1.18 mg/dL). Liver Function Tests: Recent Labs  Lab 04/23/17 1345 04/24/17 0431 04/24/17 1244 04/25/17 0040 04/26/17 0534  AST 36 46* 57* 26 14*  ALT 20 22 34 27 18  ALKPHOS 65 57 79 65 60  BILITOT 0.3 0.6 0.7 0.9 0.8  PROT 6.9 6.1* 6.6 6.0* 6.1*  ALBUMIN  4.0 3.3* 3.5 3.1* 3.2*   No results for input(s): LIPASE, AMYLASE in the last 168 hours. No results for input(s): AMMONIA in the last 168 hours. Coagulation Profile: Recent Labs  Lab 04/23/17 1345 04/25/17 0040  INR 0.93 1.14   Cardiac Enzymes: No results for input(s): CKTOTAL, CKMB, CKMBINDEX, TROPONINI in the last 168 hours. BNP (last 3 results) No results for input(s): PROBNP in the last 8760 hours. HbA1C: No results for input(s): HGBA1C in the last 72 hours. CBG: No results for input(s): GLUCAP in the last 168 hours. Lipid Profile: No results for input(s): CHOL, HDL, LDLCALC, TRIG, CHOLHDL, LDLDIRECT in the last 72 hours. Thyroid Function Tests: No results for input(s): TSH, T4TOTAL, FREET4, T3FREE, THYROIDAB in the last 72 hours. Anemia Panel: No results for input(s): VITAMINB12, FOLATE, FERRITIN, TIBC, IRON, RETICCTPCT in the last 72 hours. Urine analysis: No results found for: COLORURINE, APPEARANCEUR, LABSPEC, PHURINE, GLUCOSEU, HGBUR, BILIRUBINUR, KETONESUR, PROTEINUR, UROBILINOGEN, NITRITE, LEUKOCYTESUR Sepsis Labs: @LABRCNTIP (procalcitonin:4,lacticidven:4)   )No results found for this or any previous visit (from the past 240 hour(s)).    Radiology Studies: No results found.   Scheduled Meds: . amLODipine  10 mg Oral Daily  . atenolol  50 mg Oral Daily  . benazepril  20 mg Oral Daily  . citalopram  20 mg Oral Daily  . enoxaparin   40 mg Subcutaneous Q24H  . hydrochlorothiazide  25 mg Oral Daily  . levothyroxine  137 mcg Oral QAC breakfast  . pantoprazole  40 mg Oral Q1200  . potassium chloride  40 mEq Oral Daily  . thiamine  100 mg Oral Daily   Continuous Infusions: . sodium chloride 20 mL (04/23/17 1348)     LOS: 4 days    Time spent: 25 minutes  Greater than 50% of the time spent on counseling and coordinating the care.   Leisa Lenz, MD [facility] Pager [phone]  If 7PM-7AM, please contact  night-coverage [email or web address] Password [facility] 04/27/2017, 6:27 AM

## 2017-04-28 ENCOUNTER — Encounter (HOSPITAL_COMMUNITY): Payer: Self-pay

## 2017-04-28 MED ORDER — CAMPHOR-MENTHOL 0.5-0.5 % EX LOTN
TOPICAL_LOTION | CUTANEOUS | Status: DC | PRN
  Filled 2017-04-28: qty 222

## 2017-04-28 NOTE — Discharge Summary (Signed)
Physician Discharge Summary  BARTLEY VUOLO [id number] DOB: May 24, 1933 DOA: 04/23/2017  PCP: [name], MD  Admit date: 04/23/2017 Discharge date: 04/28/2017  Recommendations for Outpatient Follow-up:  1. Discharge to [facility]  Discharge Diagnoses:  Principal Problem:   Adjustment disorder with mixed disturbance of emotions and conduct Active Problems:   Hypothyroidism   Suicide attempt ([place])   Overdose of acetaminophen   Overdose    Discharge Condition: stable   Diet recommendation: as tolerated   History of present illness:  83 y.o.malewith medical history significant ofhypertension, hypothyroidism, COPD, chronic back pain was brought to the emergency room by EMS after he was foundobtunded by his wife with a suicide note by the bedside he reportedly ingested approximately 30 tablets of Percocet. Workup in the emergency room noted normal LFTs, Tylenol level elevated at 31 at 14 hour mark which was reportedly call to poison control and it was recommended to start him on an acetylcysteine. Psych consulted  Hospital Course:   Assessment & Plan:   Suicide attempt (HCC)/Percocet overdose / Major depressive disorder  - Elevated tylenol on admission - Received NAC - Suicide precaution - Needs [facility] placement per psych  Hypokalemia - Supplemented   Essential hypertension - Continue home meds  Hypothyroidism - Continue synthroid    DVT prophylaxis: Lovenox subQ Code Status: DNR/DNI Family Communication: no family at the bedside    Consultants:   Psych  Procedures:   None   Antimicrobials:   None    Signed:  Leisa Lenz, MD  [facility] 04/28/2017, 11:48 AM  Pager #: [phone]  Time spent in minutes: more than 30 minutes   Discharge Exam: Vitals:   04/27/17 1933 04/28/17 0601  BP: 122/78 130/76  Pulse: 65 67  Resp: 18 18  Temp: 98.1 F (36.7 C) 98.4 F (36.9 C)  SpO2: 97% 100%   Vitals:   04/27/17 0628  04/27/17 1331 04/27/17 1933 04/28/17 0601  BP: (!) 152/82 132/73 122/78 130/76  Pulse: 60 (!) 56 65 67  Resp: 18 16 18 18   Temp: 98.4 F (36.9 C) 98.3 F (36.8 C) 98.1 F (36.7 C) 98.4 F (36.9 C)  TempSrc: Oral Oral Oral Oral  SpO2: 99% 99% 97% 100%  Weight:      Height:        General: Pt is alert, follows commands appropriately, not in acute distress Cardiovascular: Regular rate and rhythm, S1/S2 +, no murmurs Respiratory: Clear to auscultation bilaterally, no wheezing, no crackles, no rhonchi Abdominal: Soft, non tender, non distended, bowel sounds +, no guarding Extremities: no edema, no cyanosis, pulses palpable bilaterally DP and PT Neuro: Grossly nonfocal  Discharge Instructions  Discharge Instructions    Call MD for:  persistant nausea and vomiting   Complete by:  As directed    Call MD for:  redness, tenderness, or signs of infection (pain, swelling, redness, odor or green/yellow discharge around incision site)   Complete by:  As directed    Call MD for:  severe uncontrolled pain   Complete by:  As directed    Diet - low sodium heart healthy   Complete by:  As directed    Increase activity slowly   Complete by:  As directed      Allergies as of 04/28/2017   No Known Allergies     Medication List    TAKE these medications   aspirin EC 81 MG tablet Take 81 mg by mouth daily.   atenolol 50 MG tablet Commonly known as:  TENORMIN Take 50 mg by mouth daily.   benazepril 20 MG tablet Commonly known as:  LOTENSIN Take 20 mg by mouth daily.   citalopram 20 MG tablet Commonly known as:  CELEXA Take 20 mg by mouth daily.   hydrochlorothiazide 25 MG tablet Commonly known as:  HYDRODIURIL Take 25 mg by mouth daily.   latanoprost 0.005 % ophthalmic solution Commonly known as:  XALATAN Place 1 drop into both eyes at bedtime.   levothyroxine 137 MCG tablet Commonly known as:  SYNTHROID, LEVOTHROID Take 137 mcg by mouth daily before breakfast.    omeprazole 20 MG capsule Commonly known as:  PRILOSEC Take 20 mg by mouth daily.   simvastatin 40 MG tablet Commonly known as:  ZOCOR Take 40 mg by mouth daily.      Follow-up Information    [name], MD. Schedule an appointment as soon as possible for a visit.   Specialty:  Internal Medicine Contact information: [address] [place] [address] [phone]            The results of significant diagnostics from this hospitalization (including imaging, microbiology, ancillary and laboratory) are listed below for reference.    Significant Diagnostic Studies: No results found.  Microbiology: No results found for this or any previous visit (from the past 240 hour(s)).   Labs: Basic Metabolic Panel: Recent Labs  Lab 04/24/17 0431 04/24/17 1244 04/25/17 0040 04/26/17 0534 04/27/17 0431  NA 140 140 138 142 137  K 3.6 3.7 3.3* 3.2* 3.8  CL 106 103 105 105 102  CO2 26 28 24 30 29   GLUCOSE 123* 114* 129* 95 98  BUN 18 16 13 15 18   CREATININE 1.04 1.14 0.95 1.10 1.18  CALCIUM 8.4* 8.7* 8.7* 8.9 9.1   Liver Function Tests: Recent Labs  Lab 04/23/17 1345 04/24/17 0431 04/24/17 1244 04/25/17 0040 04/26/17 0534  AST 36 46* 57* 26 14*  ALT 20 22 34 27 18  ALKPHOS 65 57 79 65 60  BILITOT 0.3 0.6 0.7 0.9 0.8  PROT 6.9 6.1* 6.6 6.0* 6.1*  ALBUMIN 4.0 3.3* 3.5 3.1* 3.2*   No results for input(s): LIPASE, AMYLASE in the last 168 hours. No results for input(s): AMMONIA in the last 168 hours. CBC: Recent Labs  Lab 04/23/17 1345 04/24/17 0431 04/27/17 0431  WBC 13.8* 12.1* 8.0  HGB 14.6 13.6 14.3  HCT 46.1 42.5 44.4  MCV 100.9* 97.3 98.2  PLT 164 162 155   Cardiac Enzymes: No results for input(s): CKTOTAL, CKMB, CKMBINDEX, TROPONINI in the last 168 hours. BNP: BNP (last 3 results) No results for input(s): BNP in the last 8760 hours.  ProBNP (last 3 results) No results for input(s): PROBNP in the last 8760 hours.  CBG: No  results for input(s): GLUCAP in the last 168 hours.

## 2017-04-28 NOTE — Progress Notes (Signed)
Pt accepted to  [facility] Room 401 Bed 2 ON 04/29/17 Catalina Pizza, NP is the accepting provider.  Dr. Parke Poisson is the attending provider.  Call report to [facility] notified. And will notify nursing staff on unit. Pt is Voluntary.  Pt may be transported by [facility]  Pt scheduled  to arrive at [facility] after 8 AM  Romie Minus T. Judi Cong, MSW, [place] Disposition Clinical Social Work [phone] (cell) [phone] (office)

## 2017-04-28 NOTE — Discharge Instructions (Signed)
Benazepril tablets What is this medicine? BENAZEPRIL (ben AY ze pril) is an ACE inhibitor. This medicine is used to treat high blood pressure. This medicine may be used for other purposes; ask your health care provider or pharmacist if you have questions. COMMON BRAND NAME(S): Lotensin What should I tell my health care provider before I take this medicine? They need to know if you have any of these conditions: -bone marrow disease -heart or blood vessel disease -if you are on a special diet, such as a low salt diet -immune system disease like lupus -kidney or liver disease -low blood pressure -previous swelling of the tongue, face, or lips with difficulty breathing, difficulty swallowing, hoarseness, or tightening of the throat -an unusual or allergic reaction to benazepril, other ACE inhibitors, insect venom, foods, dyes, or preservatives -pregnant or trying to get pregnant -breast-feeding How should I use this medicine? Take this medicine by mouth with a glass of water. Follow the directions on the prescription label. Take your doses at regular intervals. Do not take your medicine more often than directed. Do not stop taking this medicine except on the advice of your doctor or health care professional. Talk to your pediatrician regarding the use of this medicine in children. Special care may be needed. While this drug may be prescribed for children as young as 6 years, precautions do apply. Overdosage: If you think you have taken too much of this medicine contact a poison control center or emergency room at once. NOTE: This medicine is only for you. Do not share this medicine with others. What if I miss a dose? If you miss a dose, take it as soon as you can. If it is almost time for your next dose, take only that dose. Do not take double or extra doses. What may interact with this medicine? Do not take this medication with any of the following medications: -sacubitril; valsartan This  medicine may also interact with the following: -diuretics -everolimus -lithium -medicines for high blood pressure -NSAIDs, medicines for pain and inflammation, like ibuprofen or naproxen -potassium salts or potassium supplements -sirolimus -temsirolimus This list may not describe all possible interactions. Give your health care provider a list of all the medicines, herbs, non-prescription drugs, or dietary supplements you use. Also tell them if you smoke, drink alcohol, or use illegal drugs. Some items may interact with your medicine. What should I watch for while using this medicine? Visit your doctor or health care professional for regular checks on your progress. Check your blood pressure as directed. Ask your doctor or health care professional what your blood pressure should be and when you should contact him or her. Call your doctor or health care professional if you notice an irregular or fast heart beat. Women should inform their doctor if they wish to become pregnant or think they might be pregnant. There is a potential for serious side effects to an unborn child. Talk to your health care professional or pharmacist for more information. Check with your doctor or health care professional if you get an attack of severe diarrhea, nausea and vomiting, or if you sweat a lot. The loss of too much body fluid can make it dangerous for you to take this medicine. You may get drowsy or dizzy. Do not drive, use machinery, or do anything that needs mental alertness until you know how this drug affects you. Do not stand or sit up quickly, especially if you are an older patient. This reduces the risk of dizzy  or fainting spells. Alcohol can make you more drowsy and dizzy. Avoid alcoholic drinks. Avoid salt substitutes unless you are told otherwise by your doctor or health care professional. Do not treat yourself for coughs, colds, or pain while you are taking this medicine without asking your doctor or  health care professional for advice. Some ingredients may increase your blood pressure. What side effects may I notice from receiving this medicine? Side effects that you should report to your doctor or health care professional as soon as possible: -allergic reactions like skin rash, itching or hives, swelling of the face, lips, or tongue -breathing problems -fast, irregular heartbeat -feeling faint or lightheaded, falls -problems swallowing -redness, blistering, peeling or loosening of the skin, including inside the mouth -swelling of ankles, legs -trouble passing urine or change in the amount of urine Side effects that usually do not require medical attention (report to your doctor or health care professional if they continue or are bothersome): -cough -headache -nausea -sun sensitivity -tiredness This list may not describe all possible side effects. Call your doctor for medical advice about side effects. You may report side effects to FDA at 1-800-FDA-1088. Where should I keep my medicine? Keep out of the reach of children. Store at room temperature below 30 degrees C (86 degrees F). Protect from moisture. Keep container tightly closed. Throw away any unused medicine after the expiration date. NOTE: This sheet is a summary. It may not cover all possible information. If you have questions about this medicine, talk to your doctor, pharmacist, or health care provider.  2018 Elsevier/Gold Standard (2015-06-15 09:22:15)

## 2017-04-28 NOTE — Progress Notes (Signed)
CSW following for inpatient psychiatric placement.   Per chart review, patient's family prefers that patient go to [facility]. CSW contacted [facility] and spoke with [facility], per [facility] no patient's are currently being accepted today. Patient has no other psychiatric bed offers at this time. CSW will continue to follow and assist with inpatient psychiatric placement.   Abundio Miu, [place] Social Worker [facility] Cell#: [phone]

## 2017-04-29 ENCOUNTER — Encounter (HOSPITAL_COMMUNITY): Payer: Self-pay | Admitting: *Deleted

## 2017-04-29 ENCOUNTER — Inpatient Hospital Stay (HOSPITAL_COMMUNITY)
Admission: AD | Admit: 2017-04-29 | Discharge: 2017-05-01 | DRG: 885 | Disposition: A | Discharge status: Home / Self Care | Payer: Medicare Other | Source: Intra-hospital | Attending: Psychiatry | Admitting: Psychiatry

## 2017-04-29 ENCOUNTER — Other Ambulatory Visit: Payer: Self-pay

## 2017-04-29 DIAGNOSIS — J449 Chronic obstructive pulmonary disease, unspecified: Secondary | ICD-10-CM | POA: Diagnosis present

## 2017-04-29 DIAGNOSIS — T40602A Poisoning by unspecified narcotics, intentional self-harm, initial encounter: Secondary | ICD-10-CM

## 2017-04-29 DIAGNOSIS — F39 Unspecified mood [affective] disorder: Secondary | ICD-10-CM | POA: Diagnosis not present

## 2017-04-29 DIAGNOSIS — E039 Hypothyroidism, unspecified: Secondary | ICD-10-CM | POA: Diagnosis present

## 2017-04-29 DIAGNOSIS — T1491XA Suicide attempt, initial encounter: Secondary | ICD-10-CM

## 2017-04-29 DIAGNOSIS — Z7982 Long term (current) use of aspirin: Secondary | ICD-10-CM | POA: Diagnosis not present

## 2017-04-29 DIAGNOSIS — Z915 Personal history of self-harm: Secondary | ICD-10-CM | POA: Diagnosis not present

## 2017-04-29 DIAGNOSIS — F332 Major depressive disorder, recurrent severe without psychotic features: Principal | ICD-10-CM | POA: Diagnosis present

## 2017-04-29 DIAGNOSIS — T50902A Poisoning by unspecified drugs, medicaments and biological substances, intentional self-harm, initial encounter: Secondary | ICD-10-CM

## 2017-04-29 DIAGNOSIS — Z87891 Personal history of nicotine dependence: Secondary | ICD-10-CM | POA: Diagnosis not present

## 2017-04-29 DIAGNOSIS — K219 Gastro-esophageal reflux disease without esophagitis: Secondary | ICD-10-CM | POA: Diagnosis present

## 2017-04-29 DIAGNOSIS — E785 Hyperlipidemia, unspecified: Secondary | ICD-10-CM | POA: Diagnosis present

## 2017-04-29 DIAGNOSIS — Z79899 Other long term (current) drug therapy: Secondary | ICD-10-CM | POA: Diagnosis not present

## 2017-04-29 DIAGNOSIS — N400 Enlarged prostate without lower urinary tract symptoms: Secondary | ICD-10-CM | POA: Diagnosis present

## 2017-04-29 MED ORDER — BENAZEPRIL HCL 10 MG PO TABS
20.0000 mg | ORAL_TABLET | Freq: Every day | ORAL | Status: DC
  Administered 2017-04-30 – 2017-05-01 (×2): 20 mg via ORAL
  Filled 2017-04-29: qty 2
  Filled 2017-04-29 (×4): qty 1
  Filled 2017-04-29: qty 2

## 2017-04-29 MED ORDER — PANTOPRAZOLE SODIUM 40 MG PO TBEC
40.0000 mg | DELAYED_RELEASE_TABLET | Freq: Every day | ORAL | Status: DC
  Administered 2017-04-30 – 2017-05-01 (×2): 40 mg via ORAL
  Filled 2017-04-29 (×4): qty 1

## 2017-04-29 MED ORDER — LATANOPROST 0.005 % OP SOLN
1.0000 [drp] | Freq: Every day | OPHTHALMIC | Status: DC
  Filled 2017-04-29 (×3): qty 2.5

## 2017-04-29 MED ORDER — VITAMIN B-1 100 MG PO TABS
100.0000 mg | ORAL_TABLET | Freq: Every day | ORAL | Status: DC
  Administered 2017-04-30 – 2017-05-01 (×2): 100 mg via ORAL
  Filled 2017-04-29 (×5): qty 1

## 2017-04-29 MED ORDER — ORAL CARE MOUTH RINSE
15.0000 mL | Freq: Two times a day (BID) | OROMUCOSAL | Status: DC
  Filled 2017-04-29 (×8): qty 15

## 2017-04-29 MED ORDER — HYDROCHLOROTHIAZIDE 25 MG PO TABS
25.0000 mg | ORAL_TABLET | Freq: Every day | ORAL | Status: DC
  Administered 2017-04-30 – 2017-05-01 (×2): 25 mg via ORAL
  Filled 2017-04-29 (×5): qty 1

## 2017-04-29 MED ORDER — ONDANSETRON HCL 4 MG/2ML IJ SOLN: 4.0000 mg | Freq: Four times a day (QID) | INTRAMUSCULAR | Status: DC | PRN

## 2017-04-29 MED ORDER — LEVOTHYROXINE SODIUM 137 MCG PO TABS
137.0000 ug | ORAL_TABLET | Freq: Every day | ORAL | Status: DC
  Administered 2017-04-30 – 2017-05-01 (×2): 137 ug via ORAL
  Filled 2017-04-29 (×4): qty 1

## 2017-04-29 MED ORDER — HYDRALAZINE HCL 20 MG/ML IJ SOLN: 10.0000 mg | Freq: Four times a day (QID) | INTRAMUSCULAR | Status: DC | PRN

## 2017-04-29 MED ORDER — SIMVASTATIN 20 MG PO TABS
40.0000 mg | ORAL_TABLET | Freq: Every day | ORAL | Status: DC
  Administered 2017-04-29 – 2017-05-01 (×3): 40 mg via ORAL
  Filled 2017-04-29: qty 2
  Filled 2017-04-29 (×2): qty 1
  Filled 2017-04-29 (×2): qty 2
  Filled 2017-04-29 (×2): qty 1

## 2017-04-29 MED ORDER — ATENOLOL 50 MG PO TABS
50.0000 mg | ORAL_TABLET | Freq: Every day | ORAL | Status: DC
  Administered 2017-04-30: 50 mg via ORAL
  Filled 2017-04-29 (×5): qty 1

## 2017-04-29 MED ORDER — ENOXAPARIN SODIUM 40 MG/0.4ML ~~LOC~~ SOLN
40.0000 mg | SUBCUTANEOUS | Status: DC
  Filled 2017-04-29: qty 0.4

## 2017-04-29 MED ORDER — ASPIRIN EC 81 MG PO TBEC
81.0000 mg | DELAYED_RELEASE_TABLET | Freq: Every day | ORAL | Status: DC
  Administered 2017-04-29 – 2017-05-01 (×3): 81 mg via ORAL
  Filled 2017-04-29 (×6): qty 1

## 2017-04-29 MED ORDER — CAMPHOR-MENTHOL 0.5-0.5 % EX LOTN
TOPICAL_LOTION | CUTANEOUS | Status: DC | PRN
  Administered 2017-04-30: 1 via TOPICAL
  Filled 2017-04-29: qty 222

## 2017-04-29 MED ORDER — POTASSIUM CHLORIDE CRYS ER 20 MEQ PO TBCR
40.0000 meq | EXTENDED_RELEASE_TABLET | Freq: Every day | ORAL | Status: DC
  Filled 2017-04-29 (×2): qty 2

## 2017-04-29 MED ORDER — AMLODIPINE BESYLATE 10 MG PO TABS
10.0000 mg | ORAL_TABLET | Freq: Every day | ORAL | Status: DC
  Filled 2017-04-29 (×2): qty 1

## 2017-04-29 MED ORDER — CITALOPRAM HYDROBROMIDE 20 MG PO TABS
20.0000 mg | ORAL_TABLET | Freq: Every day | ORAL | Status: DC
  Administered 2017-04-30 – 2017-05-01 (×2): 20 mg via ORAL
  Filled 2017-04-29 (×5): qty 1

## 2017-04-29 NOTE — Progress Notes (Signed)
Patient ID: Peter Velasquez, male   DOB: 05-21-1933, 83 y.o.   MRN: [id number] PER STATE REGULATIONS 482.30  THIS CHART WAS REVIEWED FOR MEDICAL NECESSITY WITH RESPECT TO THE PATIENT'S ADMISSION/DURATION OF STAY.  NEXT REVIEW DATE: 05/03/17  Roma Schanz, RN, BSN CASE MANAGER

## 2017-04-29 NOTE — Progress Notes (Signed)
Pt seen and examined at bedside. Sitter at the bedside. Patient is medically stable for discharge to behavioral health. Patient is aware of the discharge plan to behavioral health. Please refer to discharge summary completed 04/28/2017. No changes in medications since 04/28/2017.  Peter Velasquez [facility] [phone]

## 2017-04-29 NOTE — Progress Notes (Signed)
Patient discharged to [facility] via [facility] transportation and telephone report given to [place]. Patient alert and oriented, denies any pain/distress, no wound noted-skin intact. Patient hearing aide on patient, hearing aid batteries and glasses given to transporter.

## 2017-04-29 NOTE — BHH Suicide Risk Assessment (Signed)
[facility] Admission Suicide Risk Assessment   Nursing information obtained from:   patient and chart Demographic factors:   83 year old , married,retired  Current Mental Status:   see below Loss Factors:   marital strain Historical Factors:   S/P suicide attempt, daily alcohol use  Risk Reduction Factors:   resilience   Total Time spent with patient: 45 minutes Principal Problem: S/P Suicide Attempt Diagnosis:   Patient Active Problem List   Diagnosis Date Noted  . MDD (major depressive disorder), recurrent severe, without psychosis ([place]) [F33.2] 04/29/2017  . Adjustment disorder with mixed disturbance of emotions and conduct [F43.25] 04/24/2017  . Suicide attempt ([place]) [T14.91XA] 04/23/2017  . Overdose of acetaminophen [T39.1X1A] 04/23/2017  . Overdose [T50.901A] 04/23/2017  . BENIGN PROSTATIC HYPERTROPHY [N40.0] 01/13/2007  . Hypothyroidism [E03.9] 01/09/2007  . HYPERLIPIDEMIA [E78.5] 01/09/2007  . ERECTILE DYSFUNCTION [F52.8] 01/09/2007  . DEPRESSION [F32.9] 01/09/2007  . 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 COPD [J44.9] 01/09/2007  . GERD [K21.9] 01/09/2007  . DIVERTICULOSIS, COLON [K57.30] 01/09/2007  . DEGENERATIVE JOINT DISEASE [M19.90] 01/09/2007  . PALPITATIONS, HX OF [Z86.79] 01/09/2007    Continued Clinical Symptoms:  Alcohol Use Disorder Identification Test Final Score (AUDIT): 11 The "Alcohol Use Disorders Identification Test", Guidelines for Use in Primary Care, Second Edition.  World Pharmacologist [facility]). Score between 0-7:  no or low risk or alcohol related problems. Score between 8-15:  moderate risk of alcohol related problems. Score between 16-19:  high risk of alcohol related problems. Score 20 or above:  warrants further diagnostic evaluation for alcohol dependence and treatment.   CLINICAL FACTORS:  83 year old married male, S/P suicide attempt by overdosing on opiates on 12/20. Currently reports this was not planned and following an argument with wife.  Denies symptoms of MDD at present.    Psychiatric Specialty Exam: Physical Exam  ROS  Blood pressure (!) 93/54, pulse 77, temperature 98 F (36.7 C), temperature source Oral, resp. rate 16, height 5\' 7"  (1.702 m), weight 68.5 kg (151 lb).Body mass index is 23.65 kg/m.  See admit note MSE                                                        COGNITIVE FEATURES THAT CONTRIBUTE TO RISK:  Closed-mindedness and Loss of executive function    SUICIDE RISK:   Moderate:  Frequent suicidal ideation with limited intensity, and duration, some specificity in terms of plans, no associated intent, good self-control, limited dysphoria/symptomatology, some risk factors present, and identifiable protective factors, including available and accessible social support.  PLAN OF CARE: Patient will be admitted to inpatient psychiatric unit for stabilization and safety. Will provide and encourage milieu participation. Provide medication management and maked adjustments as needed.  Will follow daily.    I certify that inpatient services furnished can reasonably be expected to improve the patient's condition.   Jenne Campus, MD 04/29/2017, 4:33 PM

## 2017-04-29 NOTE — H&P (Signed)
Psychiatric Admission Assessment Adult  Patient Identification: Peter Velasquez MRN:  [id number] Date of Evaluation:  04/29/2017 Chief Complaint:  " I was not really feeling that depressed " Principal Diagnosis: S/P Suicide Attempt by overdosing  Diagnosis:   Patient Active Problem List   Diagnosis Date Noted  . MDD (major depressive disorder), recurrent severe, without psychosis ([place]) [F33.2] 04/29/2017  . Adjustment disorder with mixed disturbance of emotions and conduct [F43.25] 04/24/2017  . Suicide attempt ([place]) [T14.91XA] 04/23/2017  . Overdose of acetaminophen [T39.1X1A] 04/23/2017  . Overdose [T50.901A] 04/23/2017  . BENIGN PROSTATIC HYPERTROPHY [N40.0] 01/13/2007  . Hypothyroidism [E03.9] 01/09/2007  . HYPERLIPIDEMIA [E78.5] 01/09/2007  . ERECTILE DYSFUNCTION [F52.8] 01/09/2007  . DEPRESSION [F32.9] 01/09/2007  . 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 COPD [J44.9] 01/09/2007  . GERD [K21.9] 01/09/2007  . DIVERTICULOSIS, COLON [K57.30] 01/09/2007  . DEGENERATIVE JOINT DISEASE [M19.90] 01/09/2007  . PALPITATIONS, HX OF [Z86.79] 01/09/2007   History of Present Illness: 83 year old married male, status post suicide attempt by overdosing on opiates ( oxycodone- had been prescribed to him for pain a few months prior , but had not been taking it). He wrote a suicide note prior to overdose. States overdose was relatively impulsive ( " I just thought about it that day"), and not planned , following an argument with his wife .  [name] revolved around patient's adult niece criticizing her father/ his brother, and wife " taking her side ".  After overdose he went to bed, states wife found him the next morning and was brought to ED . This occurred on 12/20. He was initially admitted to medical unit for medical stabilization. [name] denies feeling suicidal , and states he regrets suicide attempt, and states he is happy to be alive .[name]  minimizes depression,and states " I haven't  been feeling that depressed ". Of note, he also  currently denies significant neuro-vegetative symptoms leading up to overdose, and denies changes in sleep, appetite,energy level, and denies anhedonia or pervasive sense of sadness . Patient reports he drinks daily, up to 5-6 drinks per day, but does not feel he has been drinking more or differently recently. Admission BAL 16. Associated Signs/Symptoms: Depression Symptoms:  suicidal attempt, (Hypo) Manic Symptoms:  Denies  Anxiety Symptoms:  Denies significant anxiety or panic attacks Psychotic Symptoms:  Denies  PTSD Symptoms: Denies  Total Time spent with patient: 45 minutes  Past Psychiatric History: states he has never been hospitalized in a psychiatric hospital in the past, has never attempted suicide in the past, denies history of violence, denies history of severe depression, states he has had episodes of short lived and relatively mild depression, denies history of psychosis, denies history of mania , denies history of anxiety or panic attacks.   Is the patient at risk to self? Yes.    Has the patient been a risk to self in the past 6 months? No.  Has the patient been a risk to self within the distant past? No.  Is the patient a risk to others? No.  Has the patient been a risk to others in the past 6 months? No.  Has the patient been a risk to others within the distant past? No.   Prior Inpatient Therapy:  denies  Prior Outpatient Therapy:  denies other than marriage counseling in the past   Alcohol Screening: 1. How often do you have a drink containing alcohol?: 4 or more times a week 2. How many drinks containing alcohol  do you have on a typical day when you are drinking?: 7, 8, or 9 3. How often do you have six or more drinks on one occasion?: Daily or almost daily AUDIT-C Score: 11 4. How often during the last year have you found that you were not able to stop drinking once you had started?: Never 5. How often during the last  year have you failed to do what was normally expected from you becasue of drinking?: Never 6. How often during the last year have you needed a first drink in the morning to get yourself going after a heavy drinking session?: Never 7. How often during the last year have you had a feeling of guilt of remorse after drinking?: Never 8. How often during the last year have you been unable to remember what happened the night before because you had been drinking?: Never 9. Have you or someone else been injured as a result of your drinking?: No 10. Has a relative or friend or a doctor or another health worker been concerned about your drinking or suggested you cut down?: No Alcohol Use Disorder Identification Test Final Score (AUDIT): 11 Intervention/Follow-up: Alcohol Education Substance Abuse History in the last 12 months:  States he has been drinking 5-6 drinks per day , mostly beer and bourbon, per day. Denies history of drug abuse . Consequences of Substance Abuse: Denies  Previous Psychotropic Medications: states he has never been on psychiatric medications in the past . Psychological Evaluations:  no Past Medical History:  Past Medical History:  Diagnosis Date  . BENIGN PROSTATIC HYPERTROPHY 01/13/2007   Qualifier: Diagnosis of  By: Jenny Reichmann MD, Hunt Oris   . COPD 01/09/2007   Qualifier: Diagnosis of  By: Elveria Royals   . DEGENERATIVE JOINT DISEASE 01/09/2007   Qualifier: Diagnosis of  By: Elveria Royals   . DEPRESSION 01/09/2007   Qualifier: Diagnosis of  By: Elveria Royals   . DIVERTICULOSIS, COLON 01/09/2007   Qualifier: Diagnosis of  By: Elveria Royals   . ERECTILE DYSFUNCTION 01/09/2007   Qualifier: Diagnosis of  By: Elveria Royals   . GERD 01/09/2007   Qualifier: Diagnosis of  By: Elveria Royals   . HYPERLIPIDEMIA 01/09/2007   Qualifier: Diagnosis of  By: Elveria Royals   . HYPOTHYROIDISM 01/09/2007   Qualifier: Diagnosis of  By:  Elveria Royals   . PALPITATIONS, HX OF 01/09/2007   Qualifier: Diagnosis of  By: Elveria Royals   . TINNITUS 01/09/2007   Qualifier: Diagnosis of  By: Elveria Royals    History reviewed. No pertinent surgical history. Family History:  Father died from CVA, mother died from old age in her 68's. One sister died in 07-25-97 from brain tumor-  has two surviving siblings - a sister and a brother.  Family History  Problem Relation Age of Onset  . Stroke Father   . Hypertension Father   . Other Sister    Family Psychiatric  History: denies history of mental illness in family, states a son committed suicide in 19 in his early 36s. Tobacco Screening: Have you used any form of tobacco in the last 30 days? (Cigarettes, Smokeless Tobacco, Cigars, and/or Pipes): No Social History: married x 64 years , has 2 daughters. Lives at home with wife . Retired. Social History   Substance and Sexual Activity  Alcohol Use Yes   Comment: daily use     Social History   Substance and Sexual Activity  Drug Use No    Additional Social History:  Allergies:  No Known Allergies Lab Results: No results found for this or any previous visit (from the past 48 hour(s)).  Blood Alcohol level:  Lab Results  Component Value Date   ETH 16 (H) 30/16/0109    Metabolic Disorder Labs:  No results found for: HGBA1C, MPG No results found for: PROLACTIN No results found for: CHOL, TRIG, HDL, CHOLHDL, VLDL, LDLCALC  Current Medications: Current Facility-Administered Medications  Medication Dose Route Frequency Provider Last Rate Last Dose  . amLODipine (NORVASC) tablet 10 mg  10 mg Oral Daily Withrow, Elyse Jarvis, FNP      . atenolol (TENORMIN) tablet 50 mg  50 mg Oral Daily Withrow, Elyse Jarvis, FNP      . benazepril (LOTENSIN) tablet 20 mg  20 mg Oral Daily Withrow, Elyse Jarvis, FNP      . camphor-menthol (SARNA) lotion   Topical PRN Withrow, Elyse Jarvis, FNP      . citalopram (CELEXA) tablet 20 mg  20 mg Oral  Daily Withrow, John C, FNP      . enoxaparin (LOVENOX) injection 40 mg  40 mg Subcutaneous Q24H Withrow, John C, FNP      . hydrochlorothiazide (HYDRODIURIL) tablet 25 mg  25 mg Oral Daily Withrow, John C, FNP      . latanoprost (XALATAN) 0.005 % ophthalmic solution 1 drop  1 drop Both Eyes QHS Withrow, Elyse Jarvis, FNP      . [START ON 04/30/2017] levothyroxine (SYNTHROID, LEVOTHROID) tablet 137 mcg  137 mcg Oral QAC breakfast Withrow, Elyse Jarvis, FNP      . MEDLINE mouth rinse  15 mL Mouth Rinse BID Withrow, Elyse Jarvis, FNP      . [START ON 04/30/2017] pantoprazole (PROTONIX) EC tablet 40 mg  40 mg Oral Q1200 Withrow, Elyse Jarvis, FNP      . potassium chloride SA (K-DUR,KLOR-CON) CR tablet 40 mEq  40 mEq Oral Daily Withrow, Elyse Jarvis, FNP      . simvastatin (ZOCOR) tablet 40 mg  40 mg Oral Daily Withrow, John C, FNP      . thiamine (VITAMIN B-1) tablet 100 mg  100 mg Oral Daily Withrow, John C, FNP       PTA Medications: Medications Prior to Admission  Medication Sig Dispense Refill Last Dose  . aspirin EC 81 MG tablet Take 81 mg by mouth daily.   Past Week at Unknown time  . atenolol (TENORMIN) 50 MG tablet Take 50 mg by mouth daily.   04/22/2017 at Unknown time  . benazepril (LOTENSIN) 20 MG tablet Take 20 mg by mouth daily.   04/22/2017 at Unknown time  . citalopram (CELEXA) 20 MG tablet Take 20 mg by mouth daily.   04/22/2017 at Unknown time  . hydrochlorothiazide (HYDRODIURIL) 25 MG tablet Take 25 mg by mouth daily.   04/22/2017 at Unknown time  . latanoprost (XALATAN) 0.005 % ophthalmic solution Place 1 drop into both eyes at bedtime.   Past Week at Unknown time  . levothyroxine (SYNTHROID, LEVOTHROID) 137 MCG tablet Take 137 mcg by mouth daily before breakfast.   04/22/2017 at Unknown time  . omeprazole (PRILOSEC) 20 MG capsule Take 20 mg by mouth daily.   04/22/2017 at Unknown time  . simvastatin (ZOCOR) 40 MG tablet Take 40 mg by mouth daily.   04/22/2017 at Unknown time    Musculoskeletal: Strength  & Muscle Tone: within normal limits Gait & Station: normal- gait is slow.  Patient leans: N/A  Psychiatric Specialty Exam: Physical Exam  Review of Systems  Constitutional: Negative.   HENT: Positive for hearing loss and tinnitus.        Wears hearing aides. Reports history of tinitus  Eyes: Negative.   Respiratory: Negative.   Cardiovascular: Negative.   Gastrointestinal: Negative for blood in stool, constipation, heartburn, nausea and vomiting.  Genitourinary: Negative.   Musculoskeletal: Negative.   Skin: Negative.   Neurological: Negative for dizziness, seizures and headaches.  Endo/Heme/Allergies: Negative.   Psychiatric/Behavioral: Positive for suicidal ideas.  All other systems reviewed and are negative.   Blood pressure (!) 93/54, pulse 77, temperature 98 F (36.7 C), temperature source Oral, resp. rate 16, height 5\' 7"  (1.702 m), weight 68.5 kg (151 lb).Body mass index is 23.65 kg/m.  General Appearance: Fairly Groomed  Eye Contact:  Good  Speech:  Normal Rate  Volume:  Normal  Mood:  at this time reports he is feeling " all right", denies significant depression at this time  Affect:  Appropriate and smiles briefly at times, appropriately   Thought Process:  Linear and Descriptions of Associations: Intact  Orientation:  Full (Time, Place, and Person)   Thought Content:  no hallucinations, no delusions   Suicidal Thoughts:  No denies suicidal ideations at this time, denies any self injurious ideations  Homicidal Thoughts:  No denies any homicidal or violent ideations  Memory:  recent and remote grossly intact  3/3 immediate, 3/3 at 3 minutes   Judgement:  Fair  Insight:  Fair  Psychomotor Activity:  Normal  Concentration:  Concentration: Good and Attention Span: Good  Recall:  Good  Fund of Knowledge:  Good  Language:  Good  Akathisia:  Negative  Handed:  Right  AIMS (if indicated):     Assets:  Communication Skills Desire for Improvement Resilience  ADL's:   Intact  Cognition:  WNL  Sleep:       Treatment Plan Summary: Daily contact with patient to assess and evaluate symptoms and progress in treatment, Medication management, Plan inpatient admission and medications above   Observation Level/Precautions:  15 minute checks  Laboratory:  as needed - TSH,EKG  Psychotherapy: milieu, group therapy    Medications:  Continue Celexa 20 mgrs. Have reviewed/ adjusted admission medications with Dr. Charlies Silvers .    Consultations:  As needed   Discharge Concerns:  - will try to arrange for family meeting with wife prior to discharge   Estimated LOS: 4-5 days   Other:     Physician Treatment Plan for Primary Diagnosis: S/P Suicide Attempt  Long Term Goal(s): Improvement in symptoms so as ready for discharge  Short Term Goals: Ability to verbalize feelings will improve, Ability to disclose and discuss suicidal ideas, Ability to demonstrate self-control will improve and Ability to identify and develop effective coping behaviors will improve  Physician Treatment Plan for Secondary Diagnosis: MDD  Long Term Goal(s): Improvement in symptoms so as ready for discharge  Short Term Goals: Ability to identify changes in lifestyle to reduce recurrence of condition will improve and Ability to maintain clinical measurements within normal limits will improve  I certify that inpatient services furnished can reasonably be expected to improve the patient's condition.    Jenne Campus, MD 12/26/20183:40 PM

## 2017-04-29 NOTE — Progress Notes (Signed)
Peter Velasquez is an 83 year old male pt admitted on voluntary basis. On admission, Peter Velasquez does endorse that he took the overdose and spoke about how he and his wife usually get along pretty good but every couple of weeks they have a disagreement that lasts for about a day or so. He spoke about how he thinks that he needs marriage counseling. He spoke about how he has been married since 1955 and him and his wife have been having the same issues for decades. He does report that he has a PCP and reports that he takes his medications as prescribed. He denies any substance abuse issues but reports that he usually drinks a beer, martini and a few bourbons every day. He does not display any signs of withdrawal and reports that he did not have any issues while at the hospital. He reports that he lives with his wife and reports that he will go back there at discharge. Peter Velasquez was oriented to the unit and safety maintained.

## 2017-04-29 NOTE — Progress Notes (Signed)
Patient has DNR in chart that he states is not valid.  It has not been notarized.  Spoke with Dr. Parke Poisson and he asked that we remove DNR purple band from wrist and I stated I would place a note in the chart that patient states that DNR is not valid.

## 2017-04-29 NOTE — Progress Notes (Signed)
D: Pt sleeping in room at shift change but wakes easily.  Pt asks what the schedule is and agrees to attend group.  Pt stays in day room after meeting and eats  A snack.  Pt sts he had a good day. A: Introduced self to pt.  Actively listened to pt and offered support and encouragement. Medications administered per order.  Q15 minute safety checks maintained. R: Pt is safe on the unit.  Pt is compliant with medications.  Pt verbally contracts for safety.  Will continue to monitor and assess

## 2017-04-29 NOTE — Progress Notes (Signed)
LCSW following for inpatient psych placment.  Patient has bed at [facility].  Patient can transport after 1:00pm.  LCSW notified patient and family.  LCSW faxed voluntary form to [facility].   Patient will transport by Betsy Pries.   RN report number [phone]  Servando Snare, [place] CSW [phone]

## 2017-04-29 NOTE — Tx Team (Signed)
Initial Treatment Plan 04/29/2017 3:33 PM VANDEN FAWAZ RCV:[id number]    PATIENT STRESSORS: Marital or family conflict   PATIENT STRENGTHS: Ability for insight Active sense of humor Average or above average intelligence Capable of independent living General fund of knowledge Motivation for treatment/growth Supportive family/friends   PATIENT IDENTIFIED PROBLEMS: Depression Suicidal thoughts "Marriage counseling primarily"                     DISCHARGE CRITERIA:  Ability to meet basic life and health needs Improved stabilization in mood, thinking, and/or behavior Reduction of life-threatening or endangering symptoms to within safe limits Verbal commitment to aftercare and medication compliance  PRELIMINARY DISCHARGE PLAN: Attend aftercare/continuing care group Return to previous living arrangement  PATIENT/FAMILY INVOLVEMENT: This treatment plan has been presented to and reviewed with the patient, Peter Velasquez, and/or family member, .  The patient and family have been given the opportunity to ask questions and make suggestions.  [place], [place], [place] 04/29/2017, 3:33 PM

## 2017-04-30 DIAGNOSIS — Z87891 Personal history of nicotine dependence: Secondary | ICD-10-CM

## 2017-04-30 DIAGNOSIS — F39 Unspecified mood [affective] disorder: Secondary | ICD-10-CM

## 2017-04-30 DIAGNOSIS — F332 Major depressive disorder, recurrent severe without psychotic features: Principal | ICD-10-CM

## 2017-04-30 LAB — TSH: TSH: 4.843 u[IU]/mL — ABNORMAL HIGH (ref 0.350–4.500)

## 2017-04-30 NOTE — Progress Notes (Signed)
[facility] MD Progress Note  04/30/2017 2:40 PM Peter Velasquez  MRN:  [id number]   Subjective:  Patient reports that he is feeling good today. He denies any SI/HI/AVH and contracts for safety,. He denies any depression and states that he doesn't normally have any depression, it comes in spells. He reports that it was an impulsive act, but he admitted to writing a note. He wants to have the family meeting soon so he can go home.   Objective: Patient's chart and findings reviewed and discussed with treatment team. Patient presents in his room and is pleasant and cooperative. CSW will arrange for family meeting with wife for tomorrow. Will continue Celexa as prescribed.  Principal Problem: MDD (major depressive disorder), recurrent severe, without psychosis ([place]) Diagnosis:   Patient Active Problem List   Diagnosis Date Noted  . MDD (major depressive disorder), recurrent severe, without psychosis ([place]) [F33.2] 04/29/2017  . Adjustment disorder with mixed disturbance of emotions and conduct [F43.25] 04/24/2017  . Suicide attempt ([place]) [T14.91XA] 04/23/2017  . Overdose of acetaminophen [T39.1X1A] 04/23/2017  . Overdose [T50.901A] 04/23/2017  . BENIGN PROSTATIC HYPERTROPHY [N40.0] 01/13/2007  . Hypothyroidism [E03.9] 01/09/2007  . HYPERLIPIDEMIA [E78.5] 01/09/2007  . ERECTILE DYSFUNCTION [F52.8] 01/09/2007  . DEPRESSION [F32.9] 01/09/2007  . 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 COPD [J44.9] 01/09/2007  . GERD [K21.9] 01/09/2007  . DIVERTICULOSIS, COLON [K57.30] 01/09/2007  . DEGENERATIVE JOINT DISEASE [M19.90] 01/09/2007  . PALPITATIONS, HX OF [Z86.79] 01/09/2007   Total Time spent with patient: 15 minutes  Past Psychiatric History: See H&P  Past Medical History:  Past Medical History:  Diagnosis Date  . BENIGN PROSTATIC HYPERTROPHY 01/13/2007   Qualifier: Diagnosis of  By: Jenny Reichmann MD, Hunt Oris   . COPD 01/09/2007   Qualifier: Diagnosis of  By: Elveria Royals   . DEGENERATIVE JOINT DISEASE  01/09/2007   Qualifier: Diagnosis of  By: Elveria Royals   . DEPRESSION 01/09/2007   Qualifier: Diagnosis of  By: Elveria Royals   . DIVERTICULOSIS, COLON 01/09/2007   Qualifier: Diagnosis of  By: Elveria Royals   . ERECTILE DYSFUNCTION 01/09/2007   Qualifier: Diagnosis of  By: Elveria Royals   . GERD 01/09/2007   Qualifier: Diagnosis of  By: Elveria Royals   . HYPERLIPIDEMIA 01/09/2007   Qualifier: Diagnosis of  By: Elveria Royals   . HYPOTHYROIDISM 01/09/2007   Qualifier: Diagnosis of  By: Elveria Royals   . PALPITATIONS, HX OF 01/09/2007   Qualifier: Diagnosis of  By: Elveria Royals   . TINNITUS 01/09/2007   Qualifier: Diagnosis of  By: Elveria Royals    History reviewed. No pertinent surgical history. Family History:  Family History  Problem Relation Age of Onset  . Stroke Father   . Hypertension Father   . Other Sister    Family Psychiatric  History: See H&P Social History:  Social History   Substance and Sexual Activity  Alcohol Use Yes   Comment: daily use     Social History   Substance and Sexual Activity  Drug Use No    Social History   Socioeconomic History  . Marital status: Married    Spouse name: None  . Number of children: None  . Years of education: None  . Highest education level: None  Social Needs  . Financial resource strain: None  . Food insecurity - worry: None  . Food insecurity - inability: None  . Transportation needs - medical: None  . Transportation  needs - non-medical: None  Occupational History  . None  Tobacco Use  . Smoking status: Former Smoker    Last attempt to quit: 01/23/1998    Years since quitting: 19.2  . Smokeless tobacco: Never Used  Substance and Sexual Activity  . Alcohol use: Yes    Comment: daily use  . Drug use: No  . Sexual activity: No  Other Topics Concern  . None  Social History Narrative  . None   Additional Social History:                          Sleep: Good  Appetite:  Good  Current Medications: Current Facility-Administered Medications  Medication Dose Route Frequency Provider Last Rate Last Dose  . aspirin EC tablet 81 mg  81 mg Oral Daily Cobos, Myer Peer, MD   81 mg at 04/30/17 0857  . atenolol (TENORMIN) tablet 50 mg  50 mg Oral Daily Benjamine Mola, FNP   50 mg at 04/30/17 0857  . benazepril (LOTENSIN) tablet 20 mg  20 mg Oral Daily Benjamine Mola, FNP   20 mg at 04/30/17 0856  . camphor-menthol (SARNA) lotion   Topical PRN Benjamine Mola, FNP   1 application at 50/93/26 [phone]  . citalopram (CELEXA) tablet 20 mg  20 mg Oral Daily Withrow, Elyse Jarvis, FNP   20 mg at 04/30/17 0857  . hydrochlorothiazide (HYDRODIURIL) tablet 25 mg  25 mg Oral Daily Benjamine Mola, FNP   25 mg at 04/30/17 0857  . latanoprost (XALATAN) 0.005 % ophthalmic solution 1 drop  1 drop Both Eyes QHS Withrow, John C, FNP      . levothyroxine (SYNTHROID, LEVOTHROID) tablet 137 mcg  137 mcg Oral QAC breakfast Benjamine Mola, FNP   137 mcg at 04/30/17 0856  . MEDLINE mouth rinse  15 mL Mouth Rinse BID Withrow, Elyse Jarvis, FNP      . pantoprazole (PROTONIX) EC tablet 40 mg  40 mg Oral Q1200 Benjamine Mola, FNP   40 mg at 04/30/17 1151  . simvastatin (ZOCOR) tablet 40 mg  40 mg Oral Daily Benjamine Mola, FNP   40 mg at 04/30/17 0857  . thiamine (VITAMIN B-1) tablet 100 mg  100 mg Oral Daily Withrow, Elyse Jarvis, FNP   100 mg at 04/30/17 5809    Lab Results:  Results for orders placed or performed during the hospital encounter of 04/29/17 (from the past 48 hour(s))  TSH     Status: Abnormal   Collection Time: 04/30/17  6:04 AM  Result Value Ref Range   TSH 4.843 (H) 0.350 - 4.500 uIU/mL    Comment: Performed by a 3rd Generation assay with a functional sensitivity of <=0.01 uIU/mL. Performed at [facility], [place] [address], [place], [place] [address]     Blood Alcohol level:  Lab Results  Component Value Date   ETH  16 (H) 25/09/3974    Metabolic Disorder Labs: No results found for: HGBA1C, MPG No results found for: PROLACTIN No results found for: CHOL, TRIG, HDL, CHOLHDL, VLDL, LDLCALC  Physical Findings: AIMS: Facial and Oral Movements Muscles of Facial Expression: None, normal Lips and Perioral Area: None, normal Jaw: None, normal Tongue: None, normal,Extremity Movements Upper (arms, wrists, hands, fingers): None, normal Lower (legs, knees, ankles, toes): None, normal, Trunk Movements Neck, shoulders, hips: None, normal, Overall Severity Severity of abnormal movements (highest score from questions above): None, normal Incapacitation due  to abnormal movements: None, normal Patient's awareness of abnormal movements (rate only patient's report): No Awareness, Dental Status Current problems with teeth and/or dentures?: No Does patient usually wear dentures?: No  CIWA:    COWS:     Musculoskeletal: Strength & Muscle Tone: within normal limits Gait & Station: normal Patient leans: N/A  Psychiatric Specialty Exam: Physical Exam  Nursing note and vitals reviewed. Constitutional: He is oriented to person, place, and time. He appears well-developed and well-nourished.  Cardiovascular: Normal rate.  Respiratory: Effort normal.  Musculoskeletal: Normal range of motion.  Neurological: He is alert and oriented to person, place, and time.  Skin: Skin is warm.    Review of Systems  Constitutional: Negative.   HENT: Negative.   Eyes: Negative.   Respiratory: Negative.   Cardiovascular: Negative.   Gastrointestinal: Negative.   Genitourinary: Negative.   Musculoskeletal: Negative.   Skin: Negative.   Neurological: Negative.   Endo/Heme/Allergies: Negative.   Psychiatric/Behavioral: Negative for depression, hallucinations and suicidal ideas. The patient is not nervous/anxious.     Blood pressure 105/61, pulse 70, temperature 98 F (36.7 C), temperature source Oral, resp. rate 18, height 5'  7" (1.702 m), weight 68.5 kg (151 lb).Body mass index is 23.65 kg/m.  General Appearance: Casual  Eye Contact:  Good  Speech:  Clear and Coherent and Normal Rate  Volume:  Normal  Mood:  Euthymic  Affect:  Appropriate  Thought Process:  Goal Directed and Descriptions of Associations: Intact  Orientation:  Full (Time, Place, and Person)  Thought Content:  WDL  Suicidal Thoughts:  No  Homicidal Thoughts:  No  Memory:  Immediate;   Good Recent;   Good Remote;   Good  Judgement:  Good  Insight:  Good  Psychomotor Activity:  Normal  Concentration:  Concentration: Good and Attention Span: Good  Recall:  Good  Fund of Knowledge:  Good  Language:  Good  Akathisia:  No  Handed:  Right  AIMS (if indicated):     Assets:  Communication Skills Desire for Improvement Financial Resources/Insurance Physical Health Social Support Transportation  ADL's:  Intact  Cognition:  WNL  Sleep:  Number of Hours: 6.75   Problems Addressed: MDD severe  Treatment Plan Summary: Daily contact with patient to assess and evaluate symptoms and progress in treatment, Medication management and Plan is to:  -Continue Celexa 20 mg PO Daily for mood stability -Encourage group therapy participation -CSW to arrange familiy meeting for tomorrow  Lewis Shock, FNP 04/30/2017, 2:40 PM   Agree with NP Progress Note

## 2017-04-30 NOTE — Progress Notes (Signed)
Pt attend wrap up group. His day was a 10. His goal left from home to come here today. He has particapate all groups today trying to do things different from before to make his life better and learn from while he's here.

## 2017-04-30 NOTE — Plan of Care (Signed)
  Self-Concept: Ability to disclose and discuss suicidal ideas will improve 04/30/2017 1437 - Progressing by Rockne Coons, RN

## 2017-04-30 NOTE — Progress Notes (Signed)
Patient ID: Peter Velasquez, male   DOB: Mar 21, 1934, 83 y.o.   MRN: [id number]  DAR: Pt. Denies SI/HI and A/V Hallucinations. He reports that his sleep last night was good, his appetite is good, his energy level is normal, and his concentration level is good. He rates his depression, hopelessness, and anxiety level is 0/10. Patient does not report any pain or discomfort at this time. Support and encouragement provided to the patient. Scheduled medications administered to patient per physician's orders except for his mouth rinse which he refused. Patient is minimal but cooperative. He spends majority of his time in bed resting. He does not appear to be interacting with his peers. His affect is flat and mood depressed. Q15 minute checks are maintained for safety.

## 2017-04-30 NOTE — Progress Notes (Signed)
Family session scheduled with pt's wife, MD, CSW, and pt for Friday at [place], MSW, LCSW Clinical Social Worker 04/30/2017 11:26 AM

## 2017-04-30 NOTE — BHH Counselor (Signed)
Adult Comprehensive Assessment  Patient ID: Peter Velasquez, male   DOB: 04/24/1934, 83 y.o.   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 "nothing substantial." Social relationships: several close family members and friends in the community Substance abuse: none identified; pt overdosed on prescribed opiates prior to admission in impulsive suicide attempt Bereavement / Loss: none identified.   Living/Environment/Situation:  Living Arrangements: Spouse/significant other Living conditions (as described by patient or guardian): good How long has patient lived in current situation?: 54 + years What is atmosphere in current home: Comfortable, Quarry manager, Supportive  Family History:  Marital status: Married Number of Years Married: 63 What types of issues is patient dealing with in the relationship?: "we get into little tiffs about once every 3 weeks. Most of the time, it's no big deal, but this time, I just was impulsive and had enough." Additional relationship information: otherwise, close and loving family.  Are you sexually active?: No What is your sexual orientation?: heterosexual Has your sexual activity been affected by drugs, alcohol, medication, or emotional stress?: n/a  Does patient have children?: Yes How many children?: 3 How is patient's relationship with their children?: 2 daughters "they love me to the end of the earth." one son-passed away several years ago. grandchildren and a great grandchild on the way.   Childhood History:  By whom was/is the patient  raised?: Both parents Additional childhood history information: "I had a great family. Mom and dad raised me." no drug/alcohol abuse or mental health issues reported by patient in the family Description of patient's relationship with caregiver when they were a child: close to both parents Patient's description of current relationship with people who raised him/her: parents deceased. "My mother lived to be 54."  How were you disciplined when you got in trouble as a child/adolescent?: n/a  Does patient have siblings?: Yes Number of Siblings: 3 Description of patient's current relationship with siblings: youngest of 40. two sisters and one brother.  Did patient suffer any verbal/emotional/physical/sexual abuse as a child?: No Did patient suffer from severe childhood neglect?: No Has patient ever been sexually abused/assaulted/raped as an adolescent or adult?: No Was the patient ever a victim of a crime or a disaster?: No Witnessed domestic violence?: No Has patient been effected by domestic violence as an adult?: No  Education:  Highest grade of school patient has completed: some Field seismologist.  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 Where was the patient employed at that time?: N/A  Has patient ever been in the [employer]?: Yes (Describe in comment) Has patient ever served in combat?: Yes Patient description of combat service: navy  Did You Receive Any Psychiatric Treatment/Services While in the [employer]?: No Are There Guns or Other Weapons in [place]?: No Are These Psychologist, educational?: (n/a)  Financial Resources:   Financial resources: [employer], Income from spouse, Medicare Does patient have a Programmer, applications or guardian?: No  Alcohol/Substance Abuse:   What  has been your use of drugs/alcohol  within the last 12 months?: none identified.  If attempted suicide, did drugs/alcohol play a role in this?: Yes(pt overdosed on prescribed opiates "impulsively." ) Alcohol/Substance Abuse Treatment Hx: Denies past history If yes, describe treatment: n/a  Has alcohol/substance abuse ever caused legal problems?: No  Social Support System:   Patient's Community Support System: Good Describe Community Support System: good amount of friends and supportive family Type of faith/religion: Darrick Meigs How does patient's faith help to cope with current illness?: n/a   Leisure/Recreation:   Leisure and Hobbies: rebuilding his 56 firebird  Strengths/Needs:   What things does the patient do well?: loving; even tempered for the most part per pt.  In what areas does patient struggle / problems for patient: intermittent tiffs with wife; impulsivity  Discharge Plan:   Does patient have access to transportation?: Yes Will patient be returning to same living situation after discharge?: Yes Currently receiving community mental health services: Yes (From Whom)(PCP Dr. Lorenda Hatchet. pt open to referral for psychiatry and couple's counseling. ) If no, would patient like referral for services when discharged?: Yes (What county?)([place]) Does patient have financial barriers related to discharge medications?: No  Summary/Recommendations:   Summary and Recommendations (to be completed by the evaluator): Patient is 83yo male living in [place], [place] ([place] with his wife. He presents to the hospital seeking treatment for overdose attempt, some marital conflict leading to impulsive decision to end his life, and for medication stabilization. Patient has a diagnosis of MDD, single episode. Patient denies SI/HI/AVH and denies drug/alcohol abuse. He would like a referral for medication management/psychiatry and marital counseling. Recommendations for patient include: crisis  stabilization, therapeutic milieu, encourage group attendance and participation, medication management for mood stabilization, and development of comprehensive mental wellness plan. CSW assessing for appropriate referrals.   Kimber Relic Smart LCSW 04/30/2017 11:22 AM

## 2017-05-01 DIAGNOSIS — T50902A Poisoning by unspecified drugs, medicaments and biological substances, intentional self-harm, initial encounter: Secondary | ICD-10-CM

## 2017-05-01 NOTE — Progress Notes (Signed)
  [facility] Adult Case Management Discharge Plan :  Will you be returning to the same living situation after discharge:  Yes,  home At discharge, do you have transportation home?: Yes,  wife Do you have the ability to pay for your medications: Yes,  medicare  Release of information consent forms completed and submitted to medical records by CSW.  Patient to Follow up at: [facility], [facility].   Specialty:  Behavioral Health Why:  Please call at discharge to pay $150 deposit and schedule appt for medication management and couples counseling. They accepted Medicare. (an appt cannot be scheduled until deposit paid). Thank you.  Contact information: [address] [place] [address] [phone]           Next level of care provider has access to [place] and Suicide Prevention discussed: Yes,  SPE and family session completed with pt's wife.  Have you used any form of tobacco in the last 30 days? (Cigarettes, Smokeless Tobacco, Cigars, and/or Pipes): No  Has patient been referred to the Quitline?: N/A patient is not a smoker  Patient has been referred for addiction treatment: Alcan Border, LCSW 05/01/2017, 12:55 PM

## 2017-05-01 NOTE — Progress Notes (Signed)
Pt has been in bed all evening.  He reports that his day has been fine and that he has a family meeting with the doctor and his wife tomorrow, so he may be discharged tomorrow.  He voices no needs or concerns with Probation officer.  He was polite and appropriate with Probation officer.  He denies SI/HI/AVH.  Support and encouragement offered.  Pt refused his eye drops tonight, stating he did not use them anymore.  Discharge plans are in process.  Safety maintained with q15 minute checks.

## 2017-05-01 NOTE — [facility] Suicide Risk Assessment (Signed)
[facility] Discharge Suicide Risk Assessment   Principal Problem: MDD (major depressive disorder), recurrent severe, without psychosis ([place]) Discharge Diagnoses:  Patient Active Problem List   Diagnosis Date Noted  . MDD (major depressive disorder), recurrent severe, without psychosis ([place]) [F33.2] 04/29/2017  . Adjustment disorder with mixed disturbance of emotions and conduct [F43.25] 04/24/2017  . Suicide attempt ([place]) [T14.91XA] 04/23/2017  . Overdose of acetaminophen [T39.1X1A] 04/23/2017  . Overdose [T50.901A] 04/23/2017  . BENIGN PROSTATIC HYPERTROPHY [N40.0] 01/13/2007  . Hypothyroidism [E03.9] 01/09/2007  . HYPERLIPIDEMIA [E78.5] 01/09/2007  . ERECTILE DYSFUNCTION [F52.8] 01/09/2007  . DEPRESSION [F32.9] 01/09/2007  . 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 COPD [J44.9] 01/09/2007  . GERD [K21.9] 01/09/2007  . DIVERTICULOSIS, COLON [K57.30] 01/09/2007  . DEGENERATIVE JOINT DISEASE [M19.90] 01/09/2007  . PALPITATIONS, HX OF [Z86.79] 01/09/2007    Total Time spent with patient: 45 minutes  Musculoskeletal: Strength & Muscle Tone: within normal limits Gait & Station: slow gait  Patient leans: N/A  Psychiatric Specialty Exam: Review of Systems  Psychiatric/Behavioral: Positive for substance abuse.  no headache, no chest pain, no shortness of breath, no vomiting    Blood pressure (!) 93/50, pulse 65, temperature 97.6 F (36.4 C), temperature source Oral, resp. rate 18, height 5\' 7"  (1.702 m), weight 68.5 kg (151 lb).Body mass index is 23.65 kg/m.  General Appearance: improving grooming   Eye Contact::  Good  Speech:  Normal Rate409  Volume:  Normal  Mood:  improved mood, currently denies feeling depressed   Affect:  Appropriate and more reactive   Thought Process:  Linear and Descriptions of Associations: Intact  Orientation:  Full (Time, Place, and Person)  Thought Content:  denies hallucinations, no delusions, not internally preoccupied   Suicidal Thoughts:  No denies any  suicidal or self injurious ideations. Regarding recent suicide attempt reports feeling " I really regret it , it was a bad decision, I want to live and enjoy my family"  Homicidal Thoughts:  No  Memory:  recent and remote grossly intact   Judgement:   improving   Insight:  Fair- improving   Psychomotor Activity:  improving - ambulation has improved since admission  Concentration:  Good  Recall:  Good  Fund of Knowledge:Good  Language: Good  Akathisia:  Negative  Handed:  Right  AIMS (if indicated):     Assets:  Communication Skills Desire for Improvement Resilience  Sleep:  Number of Hours: 6.75  Cognition: WNL  ADL's: improving    Mental Status Per Nursing Assessment::   On Admission:     Demographic Factors:  83 year old male, married, lives with wife, retired .  Loss Factors: Recent argument with wife . Retirement, advancing age.   Historical Factors: No prior psychiatric admissions, no prior suicidal attempts, denies prior history of severe depression. Reports daily drinking , up to 5-6 drinks per day.   Risk Reduction Factors:   Sense of responsibility to family, Living with another person, especially a relative, Positive social support and Positive coping skills or problem solving skills  Continued Clinical Symptoms:  Patient presents with improving mood and range of affect . [name] alert, attentive, pleasant on approach, calm, denies feeling depressed, affect is reactive, smiles and even laughs appropriately during session. No thought disorder, denies hallucinations,no delusions, not internally preoccupied .  Denies medication side effects. Behavior on unit calm , pleasant . [name] They both affirmed great love to each other, and patient described feeling  remorseful and apologetic about his recent suicide attempt. States he wants to spend more time with her and with family . They both expressed an interest in  couples' therapy. We reviewed concerns about regular alcohol consumption and reviewed potential role alcohol can have on impulsivity, mood , suicide risk. Wife reports he seems much improved and is in agreement with discharge today.  Cognitive Features That Contribute To Risk:  No gross cognitive deficits noted upon discharge. Is alert , attentive, and oriented x 3   Suicide Risk:  Mild:  Suicidal ideation of limited frequency, intensity, duration, and specificity.  There are no identifiable plans, no associated intent, mild dysphoria and related symptoms, good self-control (both objective and subjective assessment), few other risk factors, and identifiable protective factors, including available and accessible social support.  [place] Follow up.   Specialty:  Behavioral Health Why:  Please call at discharge to pay $150 deposit and schedule appt for medication management and couples counseling. They accepted Medicare. (an appt cannot be scheduled until deposit paid). Thank you.  Contact information: [address] [place] [address] [phone]           Plan Of Care/Follow-up recommendations:  Activity:  as tolerated  Diet:  regular Tests:  NA Other:  See below  Patient is expressing desire and readiness  to discharge - no current grounds for involuntary commitment  Plans to return home Plans to follow up as above Abstinence from alcohol encouraged /discussed . Patient plans to follow up as above . He also has a PCP for management of medical issues as needed .  Jenne Campus, MD 05/01/2017, 2:19 PM

## 2017-05-01 NOTE — Discharge Summary (Signed)
Physician Discharge Summary Note  Patient:  Peter Velasquez is an 83 y.o., male MRN:  [id number] DOB:  03/03/1934 Patient phone:  [phone] (home)  Patient address:   [place] [address],  Total Time spent with patient: 20 minutes  Date of Admission:  04/29/2017 Date of Discharge: 05/01/17   Reason for Admission:  Worsening depression with SI  Principal Problem: MDD (major depressive disorder), recurrent severe, without psychosis [facility]) Discharge Diagnoses: Patient Active Problem List   Diagnosis Date Noted  . MDD (major depressive disorder), recurrent severe, without psychosis ([place]) [F33.2] 04/29/2017  . Adjustment disorder with mixed disturbance of emotions and conduct [F43.25] 04/24/2017  . Suicide attempt ([place]) [T14.91XA] 04/23/2017  . Overdose of acetaminophen [T39.1X1A] 04/23/2017  . Overdose [T50.901A] 04/23/2017  . BENIGN PROSTATIC HYPERTROPHY [N40.0] 01/13/2007  . Hypothyroidism [E03.9] 01/09/2007  . HYPERLIPIDEMIA [E78.5] 01/09/2007  . ERECTILE DYSFUNCTION [F52.8] 01/09/2007  . DEPRESSION [F32.9] 01/09/2007  . 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 COPD [J44.9] 01/09/2007  . GERD [K21.9] 01/09/2007  . DIVERTICULOSIS, COLON [K57.30] 01/09/2007  . DEGENERATIVE JOINT DISEASE [M19.90] 01/09/2007  . PALPITATIONS, HX OF [Z86.79] 01/09/2007    Past Psychiatric History: states he has never been hospitalized in a psychiatric [facility] in the past, has never attempted suicide in the past, denies history of violence, denies history of severe depression, states he has had episodes of short lived and relatively mild depression, denies history of psychosis, denies history of mania , denies history of anxiety or panic attacks  Past Medical History:  Past Medical History:  Diagnosis Date  . BENIGN PROSTATIC HYPERTROPHY 01/13/2007   Qualifier: Diagnosis of  By: Jenny Reichmann MD, Hunt Oris   . COPD 01/09/2007   Qualifier: Diagnosis of  By: Elveria Royals   .  DEGENERATIVE JOINT DISEASE 01/09/2007   Qualifier: Diagnosis of  By: Elveria Royals   . DEPRESSION 01/09/2007   Qualifier: Diagnosis of  By: Elveria Royals   . DIVERTICULOSIS, COLON 01/09/2007   Qualifier: Diagnosis of  By: Elveria Royals   . ERECTILE DYSFUNCTION 01/09/2007   Qualifier: Diagnosis of  By: Elveria Royals   . GERD 01/09/2007   Qualifier: Diagnosis of  By: Elveria Royals   . HYPERLIPIDEMIA 01/09/2007   Qualifier: Diagnosis of  By: Elveria Royals   . HYPOTHYROIDISM 01/09/2007   Qualifier: Diagnosis of  By: Elveria Royals   . PALPITATIONS, HX OF 01/09/2007   Qualifier: Diagnosis of  By: Elveria Royals   . TINNITUS 01/09/2007   Qualifier: Diagnosis of  By: Elveria Royals    History reviewed. No pertinent surgical history. Family History:  Family History  Problem Relation Age of Onset  . Stroke Father   . Hypertension Father   . Other Sister    Family Psychiatric  History: denies history of mental illness in family, states a son committed suicide in 30 in his early 5s Social History:  Social History   Substance and Sexual Activity  Alcohol Use Yes   Comment: daily use     Social History   Substance and Sexual Activity  Drug Use No    Social History   Socioeconomic History  . Marital status: Married    Spouse name: None  . Number of children: None  . Years of education: None  . Highest education level: None  Social Needs  . Financial resource strain: None  . Food insecurity - worry: None  . Food insecurity - inability: None  .  Transportation needs - medical: None  . Transportation needs - non-medical: None  Occupational History  . None  Tobacco Use  . Smoking status: Former Smoker    Last attempt to quit: 01/23/1998    Years since quitting: 19.2  . Smokeless tobacco: Never Used  Substance and Sexual Activity  . Alcohol use: Yes    Comment: daily use  . Drug use: No  . Sexual  activity: No  Other Topics Concern  . None  Social History Narrative  . None    [facility] Course:   04/29/17 [facility] MD Assessment: 83 year old married male, status post suicide attempt by overdosing on opiates ( oxycodone- had been prescribed to him for pain a few months prior , but had not been taking it). He wrote a suicide note prior to overdose. States overdose was relatively impulsive ( " I just thought about it that day"), and not planned , following an argument with his wife .  [name] revolved around patient's adult niece criticizing her father/ his brother, and wife " taking her side ". After overdose he went to bed, states wife found him the next morning and was brought to ED . This occurred on 12/20. He was initially admitted to medical unit for medical stabilization. [name] denies feeling suicidal , and states he regrets suicide attempt, and states he is happy to be alive .[name]  minimizes depression,and states " I haven't been feeling that depressed ". Of note, he also  currently denies significant neuro-vegetative symptoms leading up to overdose, and denies changes in sleep, appetite,energy level, and denies anhedonia or pervasive sense of sadness . Patient reports he drinks daily, up to 5-6 drinks per day, but does not feel he has been drinking more or differently recently. Admission BAL 16.  Patient remained on the [facility] unit for 2 days and stabilized on medications and therapy. Patient was continued on Celexa 20 mg Daily, and continued on all home medications. Patient's wife came in for family meeting "[name] They both affirmed great love to each other, and patient described feeling remorseful and apologetic about his recent suicide attempt. States he wants to spend more time with her and with family . They both expressed an interest in couples' therapy. We reviewed concerns about regular alcohol consumption and reviewed  potential role alcohol can have on impulsivity, mood , suicide risk. Wife reports he seems much improved and is in agreement with discharge today." Patient was discharged home with wife and instructed to continue current medications. Patient agrees to go to counseling at [facility]. Patient denies any SI/HI/AVH and contracts for safety.         Physical Findings: AIMS: Facial and Oral Movements Muscles of Facial Expression: None, normal Lips and Perioral Area: None, normal Jaw: None, normal Tongue: None, normal,Extremity Movements Upper (arms, wrists, hands, fingers): None, normal Lower (legs, knees, ankles, toes): None, normal, Trunk Movements Neck, shoulders, hips: None, normal, Overall Severity Severity of abnormal movements (highest score from questions above): None, normal Incapacitation due to abnormal movements: None, normal Patient's awareness of abnormal movements (rate only patient's report): No Awareness, Dental Status Current problems with teeth and/or dentures?: No Does patient usually wear dentures?: No  CIWA:    COWS:     Musculoskeletal: Strength & Muscle Tone: within normal limits Gait & Station: normal Patient leans: N/A  Psychiatric Specialty Exam: Physical Exam  Nursing note and vitals reviewed. Constitutional:  He is oriented to person, place, and time. He appears well-developed and well-nourished.  Cardiovascular: Normal rate.  Respiratory: Effort normal.  Musculoskeletal: Normal range of motion.  Neurological: He is alert and oriented to person, place, and time.  Skin: Skin is warm.    Review of Systems  Constitutional: Negative.   HENT: Negative.   Eyes: Negative.   Respiratory: Negative.   Cardiovascular: Negative.   Gastrointestinal: Negative.   Genitourinary: Negative.   Musculoskeletal: Negative.   Skin: Negative.   Neurological: Negative.   Endo/Heme/Allergies: Negative.   Psychiatric/Behavioral: Negative.     Blood pressure  (!) 93/50, pulse 65, temperature 97.6 F (36.4 C), temperature source Oral, resp. rate 18, height 5\' 7"  (1.702 m), weight 68.5 kg (151 lb).Body mass index is 23.65 kg/m.  General Appearance: Casual  Eye Contact:  Good  Speech:  Clear and Coherent and Normal Rate  Volume:  Normal  Mood:  Euthymic  Affect:  Appropriate  Thought Process:  Goal Directed and Descriptions of Associations: Intact  Orientation:  Full (Time, Place, and Person)  Thought Content:  WDL  Suicidal Thoughts:  No  Homicidal Thoughts:  No  Memory:  Immediate;   Good Recent;   Good Remote;   Good  Judgement:  Good  Insight:  Good  Psychomotor Activity:  Normal  Concentration:  Concentration: Good and Attention Span: Good  Recall:  Good  Fund of Knowledge:  Good  Language:  Good  Akathisia:  No  Handed:  Right  AIMS (if indicated):     Assets:  Communication Skills Desire for Improvement Financial Resources/Insurance Housing Physical Health Social Support Transportation  ADL's:  Intact  Cognition:  WNL  Sleep:  Number of Hours: 6.75     Have you used any form of tobacco in the last 30 days? (Cigarettes, Smokeless Tobacco, Cigars, and/or Pipes): No  Has this patient used any form of tobacco in the last 30 days? (Cigarettes, Smokeless Tobacco, Cigars, and/or Pipes) Yes, No  Blood Alcohol level:  Lab Results  Component Value Date   ETH 16 (H) 90/30/0923    Metabolic Disorder Labs:  No results found for: HGBA1C, MPG No results found for: PROLACTIN No results found for: CHOL, TRIG, HDL, CHOLHDL, VLDL, LDLCALC  See Psychiatric Specialty Exam and Suicide Risk Assessment completed by Attending Physician prior to discharge.  Discharge destination:  Home  Is patient on multiple antipsychotic therapies at discharge:  No   Has Patient had three or more failed trials of antipsychotic monotherapy by history:  No  Recommended Plan for Multiple Antipsychotic Therapies: NA   Allergies as of 05/01/2017    No Known Allergies     Medication List    TAKE these medications     Indication  aspirin EC 81 MG tablet Take 81 mg by mouth daily.  Indication:  Per PCP   atenolol 50 MG tablet Commonly known as:  TENORMIN Take 50 mg by mouth daily.  Indication:  Per PCP   benazepril 20 MG tablet Commonly known as:  LOTENSIN Take 20 mg by mouth daily.  Indication:  Per PCP   citalopram 20 MG tablet Commonly known as:  CELEXA Take 20 mg by mouth daily.  Indication:  Per PCP   hydrochlorothiazide 25 MG tablet Commonly known as:  HYDRODIURIL Take 25 mg by mouth daily.  Indication:  High Blood Pressure Disorder   latanoprost 0.005 % ophthalmic solution Commonly known as:  XALATAN Place 1 drop into both eyes at bedtime.  Indication:  Per PCP   levothyroxine 137 MCG tablet Commonly known as:  SYNTHROID, LEVOTHROID Take 137 mcg by mouth daily before breakfast.  Indication:  Underactive Thyroid   omeprazole 20 MG capsule Commonly known as:  PRILOSEC Take 20 mg by mouth daily.  Indication:  Gastroesophageal Reflux Disease   simvastatin 40 MG tablet Commonly known as:  ZOCOR Take 40 mg by mouth daily.  Indication:  High Amount of Fats in the [place], [facility] Follow up.   Specialty:  Behavioral Health Why:  Please call at discharge to pay $150 deposit and schedule appt for medication management and couples counseling. They accepted Medicare. (an appt cannot be scheduled until deposit paid). Thank you.  Contact information: [address] [place] [address] [phone]           Follow-up recommendations:  Continue activity as tolerated. Continue diet as recommended by your PCP. Ensure to keep all appointments with outpatient providers.  Comments:  Patient is instructed prior to discharge to: Take all medications as prescribed by his/her mental healthcare provider. Report any adverse effects and  or reactions from the medicines to his/her outpatient provider promptly. Patient has been instructed & cautioned: To not engage in alcohol and or illegal drug use while on prescription medicines. In the event of worsening symptoms, patient is instructed to call the crisis hotline, 911 and or go to the nearest ED for appropriate evaluation and treatment of symptoms. To follow-up with his/her primary care provider for your other medical issues, concerns and or health care needs.    Signed: Lowry Ram Money, FNP 05/01/2017, 12:58 PM   Patient seen, Suicide Assessment Completed.  Disposition Plan Reviewed

## 2017-05-01 NOTE — Tx Team (Signed)
Interdisciplinary Treatment and Diagnostic Plan Update  05/01/2017 Time of Session: [id number] Peter Velasquez MRN: [id number]  Principal Diagnosis: MDD (major depressive disorder), recurrent severe, without psychosis ([place])  Secondary Diagnoses: Principal Problem:   MDD (major depressive disorder), recurrent severe, without psychosis ([place])   Current Medications:  Current Facility-Administered Medications  Medication Dose Route Frequency Provider Last Rate Last Dose  . aspirin EC tablet 81 mg  81 mg Oral Daily Cobos, Myer Peer, MD   81 mg at 05/01/17 0834  . atenolol (TENORMIN) tablet 50 mg  50 mg Oral Daily Benjamine Mola, [place]   Stopped at 05/01/17 [phone]  . benazepril (LOTENSIN) tablet 20 mg  20 mg Oral Daily Benjamine Mola, FNP   20 mg at 05/01/17 0834  . camphor-menthol (SARNA) lotion   Topical PRN Benjamine Mola, FNP   1 application at 62/83/66 [phone]  . citalopram (CELEXA) tablet 20 mg  20 mg Oral Daily Benjamine Mola, FNP   20 mg at 05/01/17 6546  . hydrochlorothiazide (HYDRODIURIL) tablet 25 mg  25 mg Oral Daily Benjamine Mola, FNP   25 mg at 05/01/17 5035  . latanoprost (XALATAN) 0.005 % ophthalmic solution 1 drop  1 drop Both Eyes QHS Withrow, John C, FNP      . levothyroxine (SYNTHROID, LEVOTHROID) tablet 137 mcg  137 mcg Oral QAC breakfast Benjamine Mola, FNP   137 mcg at 05/01/17 4656  . MEDLINE mouth rinse  15 mL Mouth Rinse BID Withrow, Elyse Jarvis, FNP      . pantoprazole (PROTONIX) EC tablet 40 mg  40 mg Oral Q1200 Benjamine Mola, FNP   40 mg at 04/30/17 1151  . simvastatin (ZOCOR) tablet 40 mg  40 mg Oral Daily Benjamine Mola, FNP   40 mg at 05/01/17 8127  . thiamine (VITAMIN B-1) tablet 100 mg  100 mg Oral Daily Benjamine Mola, FNP   100 mg at 05/01/17 5170   PTA Medications: Medications Prior to Admission  Medication Sig Dispense Refill Last Dose  . aspirin EC 81 MG tablet Take 81 mg by mouth daily.   Past Week at Unknown time  . atenolol (TENORMIN) 50 MG tablet Take 50  mg by mouth daily.   04/22/2017 at Unknown time  . benazepril (LOTENSIN) 20 MG tablet Take 20 mg by mouth daily.   04/22/2017 at Unknown time  . citalopram (CELEXA) 20 MG tablet Take 20 mg by mouth daily.   04/22/2017 at Unknown time  . hydrochlorothiazide (HYDRODIURIL) 25 MG tablet Take 25 mg by mouth daily.   04/22/2017 at Unknown time  . latanoprost (XALATAN) 0.005 % ophthalmic solution Place 1 drop into both eyes at bedtime.   Past Week at Unknown time  . levothyroxine (SYNTHROID, LEVOTHROID) 137 MCG tablet Take 137 mcg by mouth daily before breakfast.   04/22/2017 at Unknown time  . omeprazole (PRILOSEC) 20 MG capsule Take 20 mg by mouth daily.   04/22/2017 at Unknown time  . simvastatin (ZOCOR) 40 MG tablet Take 40 mg by mouth daily.   04/22/2017 at Unknown time    Patient Stressors: Marital or family conflict  Patient Strengths: Ability for insight Active sense of humor Average or above average intelligence Capable of independent living General fund of knowledge Motivation for treatment/growth Supportive family/friends  Treatment Modalities: Medication Management, Group therapy, Case management,  1 to 1 session with clinician, Psychoeducation, Recreational therapy.   Physician Treatment Plan for Primary Diagnosis: MDD (major depressive disorder), recurrent  severe, without psychosis ([place]) Long Term Goal(s): Improvement in symptoms so as ready for discharge Improvement in symptoms so as ready for discharge   Short Term Goals: Ability to verbalize feelings will improve Ability to disclose and discuss suicidal ideas Ability to demonstrate self-control will improve Ability to identify and develop effective coping behaviors will improve Ability to identify changes in lifestyle to reduce recurrence of condition will improve Ability to maintain clinical measurements within normal limits will improve  Medication Management: Evaluate patient's response, side effects, and tolerance of  medication regimen.  Therapeutic Interventions: 1 to 1 sessions, Unit Group sessions and Medication administration.  Evaluation of Outcomes: Progressing  Physician Treatment Plan for Secondary Diagnosis: Principal Problem:   MDD (major depressive disorder), recurrent severe, without psychosis ([place])  Long Term Goal(s): Improvement in symptoms so as ready for discharge Improvement in symptoms so as ready for discharge   Short Term Goals: Ability to verbalize feelings will improve Ability to disclose and discuss suicidal ideas Ability to demonstrate self-control will improve Ability to identify and develop effective coping behaviors will improve Ability to identify changes in lifestyle to reduce recurrence of condition will improve Ability to maintain clinical measurements within normal limits will improve     Medication Management: Evaluate patient's response, side effects, and tolerance of medication regimen.  Therapeutic Interventions: 1 to 1 sessions, Unit Group sessions and Medication administration.  Evaluation of Outcomes: Progressing   RN Treatment Plan for Primary Diagnosis: MDD (major depressive disorder), recurrent severe, without psychosis ([place]) Long Term Goal(s): Knowledge of disease and therapeutic regimen to maintain health will improve  Short Term Goals: Ability to remain free from injury will improve, Ability to participate in decision making will improve and Ability to identify and develop effective coping behaviors will improve  Medication Management: RN will administer medications as ordered by provider, will assess and evaluate patient's response and provide education to patient for prescribed medication. RN will report any adverse and/or side effects to prescribing provider.  Therapeutic Interventions: 1 on 1 counseling sessions, Psychoeducation, Medication administration, Evaluate responses to treatment, Monitor vital signs and CBGs as ordered, Perform/monitor  CIWA, COWS, AIMS and Fall Risk screenings as ordered, Perform wound care treatments as ordered.  Evaluation of Outcomes: Progressing   LCSW Treatment Plan for Primary Diagnosis: MDD (major depressive disorder), recurrent severe, without psychosis ([place]) Long Term Goal(s): Safe transition to appropriate next level of care at discharge, Engage patient in therapeutic group addressing interpersonal concerns.  Short Term Goals: Engage patient in aftercare planning with referrals and resources, Increase emotional regulation and Increase skills for wellness and recovery  Therapeutic Interventions: Assess for all discharge needs, 1 to 1 time with Social worker, Explore available resources and support systems, Assess for adequacy in community support network, Educate family and significant other(s) on suicide prevention, Complete Psychosocial Assessment, Interpersonal group therapy.  Evaluation of Outcomes: Progressing   Progress in Treatment: Attending groups: Yes. Participating in groups: Yes. Taking medication as prescribed: Yes. Toleration medication: Yes. Family/Significant other contact made: Yes, individual(s) contacted:  pt's wife. Family session also scheduled for 11:30AM Friday. Patient understands diagnosis: Yes. Discussing patient identified problems/goals with staff: Yes. Medical problems stabilized or resolved: Yes. Denies suicidal/homicidal ideation: Yes. Issues/concerns per patient self-inventory: No. Other: n/a  New problem(s) identified: No, Describe:  n/a  New Short Term/Long Term Goal(s): elimination of SI thoughts; development of comprehensive mental wellness plan, medication management for mood stabilization.   Discharge Plan or Barriers: CSW assessing. [facility] unable to  take him due to insurance. [facility] outpatient booked out until march 2019. [facility] is an option.   Reason for Continuation of Hospitalization: Anxiety Depression Medication  stabilization Suicidal ideation  Estimated Length of Stay: Friday, 05/01/17  Attendees: Patient: 05/01/2017 10:23 AM  Physician: Dr. Parke Poisson MD 05/01/2017 10:23 AM  Nursing:Penny, Christa RN 05/01/2017 10:23 AM  RN Care Manager: x 05/01/2017 10:23 AM  Social Worker: [place], LCSW 05/01/2017 10:23 AM  Recreational Therapist: x 05/01/2017 10:23 AM  Other: Lindell Spar NP; Darnelle Maffucci Money NP 05/01/2017 10:23 AM  Other:  05/01/2017 10:23 AM  Other: 05/01/2017 10:23 AM    Scribe for Treatment Team: [place], LCSW 05/01/2017 10:23 AM

## 2017-05-01 NOTE — Progress Notes (Signed)
Recreation Therapy Notes  Date: 05/01/17 Time: 0930 Location: [address] Dayroom  Group Topic: Stress Management  Goal Area(s) Addresses:  Patient will verbalize importance of using healthy stress management.  Patient will identify positive emotions associated with healthy stress management.   Intervention: Stress Management  Activity :  Body Scan Meditation.  LRT introduced the stress management technique of meditation.  LRT played a meditation from the Calm app that guided them through a body scan to allow them to become aware of any sensations, tensions or uneasy feelings they may be experiencing.  Education:  Stress Management, Discharge Planning.   Education Outcome: Acknowledges edcuation/In group clarification offered/Needs additional education  Clinical Observations/Feedback: Pt did not attend group.    Victorino Sparrow, LRT/CTRS         Ria Comment, Erisa Mehlman A 05/01/2017 11:02 AM

## 2017-05-01 NOTE — Progress Notes (Signed)
Data. Patient denies SI/HI/AVH. Verbally contracts for safety on the unit and to come to staff before acting of any self harm thoughts/feelings.  Patient interacting well with staff and other patients. Affect is bright and patient denies any issues. He reports 0/10 for depression, anxiety and hopelessness. His goal for today is, "I feel great." P atient reports the family session with the doctor and his wife, "Martin Majestic really well and I am going home this afternoon." Action. Emotional support and encouragement offered. Education provided on medication, indications and side effect. Q 15 minute checks done for safety. Response. Safety on the unit maintained through 15 minute checks.  Medications taken as prescribed. Attended groups. Remained calm and appropriate through out shift.  Pt. discharged to lobby.  Belongings sheet reviewed and signed by pt. and all belongings sent home. Paperwork reviewed and pt. able to verbalize understanding of education. Pt. in no current distress and ambulatory.

## 2017-05-20 DIAGNOSIS — F339 Major depressive disorder, recurrent, unspecified: Secondary | ICD-10-CM | POA: Diagnosis not present

## 2017-05-20 DIAGNOSIS — F4321 Adjustment disorder with depressed mood: Secondary | ICD-10-CM | POA: Diagnosis not present

## 2017-05-21 DIAGNOSIS — D2261 Melanocytic nevi of right upper limb, including shoulder: Secondary | ICD-10-CM | POA: Diagnosis not present

## 2017-05-21 DIAGNOSIS — L821 Other seborrheic keratosis: Secondary | ICD-10-CM | POA: Diagnosis not present

## 2017-05-21 DIAGNOSIS — D2262 Melanocytic nevi of left upper limb, including shoulder: Secondary | ICD-10-CM | POA: Diagnosis not present

## 2017-05-21 DIAGNOSIS — L298 Other pruritus: Secondary | ICD-10-CM | POA: Diagnosis not present

## 2017-05-21 DIAGNOSIS — D034 Melanoma in situ of scalp and neck: Secondary | ICD-10-CM | POA: Diagnosis not present

## 2017-05-21 DIAGNOSIS — D225 Melanocytic nevi of trunk: Secondary | ICD-10-CM | POA: Diagnosis not present

## 2017-05-21 DIAGNOSIS — D1801 Hemangioma of skin and subcutaneous tissue: Secondary | ICD-10-CM | POA: Diagnosis not present

## 2017-06-08 DIAGNOSIS — D034 Melanoma in situ of scalp and neck: Secondary | ICD-10-CM | POA: Diagnosis not present

## 2017-06-16 DIAGNOSIS — M5136 Other intervertebral disc degeneration, lumbar region: Secondary | ICD-10-CM | POA: Diagnosis not present

## 2017-06-18 DIAGNOSIS — D034 Melanoma in situ of scalp and neck: Secondary | ICD-10-CM | POA: Diagnosis not present

## 2017-07-01 DIAGNOSIS — M5136 Other intervertebral disc degeneration, lumbar region: Secondary | ICD-10-CM | POA: Diagnosis not present

## 2017-07-01 DIAGNOSIS — M48061 Spinal stenosis, lumbar region without neurogenic claudication: Secondary | ICD-10-CM | POA: Diagnosis not present

## 2017-07-14 DIAGNOSIS — M5416 Radiculopathy, lumbar region: Secondary | ICD-10-CM | POA: Diagnosis not present

## 2017-07-23 DIAGNOSIS — L298 Other pruritus: Secondary | ICD-10-CM | POA: Diagnosis not present

## 2017-07-23 DIAGNOSIS — Z85820 Personal history of malignant melanoma of skin: Secondary | ICD-10-CM | POA: Diagnosis not present

## 2017-07-29 ENCOUNTER — Other Ambulatory Visit: Payer: Self-pay | Admitting: Internal Medicine

## 2017-07-29 ENCOUNTER — Other Ambulatory Visit (HOSPITAL_COMMUNITY): Payer: Self-pay | Admitting: Internal Medicine

## 2017-07-29 ENCOUNTER — Ambulatory Visit
Admission: RE | Admit: 2017-07-29 | Discharge: 2017-07-29 | Disposition: A | Discharge status: Home / Self Care | Payer: Medicare Other | Source: Ambulatory Visit | Attending: Internal Medicine | Admitting: Internal Medicine

## 2017-07-29 DIAGNOSIS — R0781 Pleurodynia: Secondary | ICD-10-CM

## 2017-07-29 DIAGNOSIS — J449 Chronic obstructive pulmonary disease, unspecified: Secondary | ICD-10-CM | POA: Diagnosis not present

## 2017-07-29 DIAGNOSIS — S2232XA Fracture of one rib, left side, initial encounter for closed fracture: Secondary | ICD-10-CM | POA: Diagnosis not present

## 2017-07-29 DIAGNOSIS — R918 Other nonspecific abnormal finding of lung field: Secondary | ICD-10-CM | POA: Diagnosis not present

## 2017-07-29 DIAGNOSIS — W19XXXA Unspecified fall, initial encounter: Secondary | ICD-10-CM | POA: Diagnosis not present

## 2017-07-29 DIAGNOSIS — M48061 Spinal stenosis, lumbar region without neurogenic claudication: Secondary | ICD-10-CM | POA: Diagnosis not present

## 2017-07-29 DIAGNOSIS — J90 Pleural effusion, not elsewhere classified: Secondary | ICD-10-CM | POA: Diagnosis not present

## 2017-07-29 DIAGNOSIS — M5136 Other intervertebral disc degeneration, lumbar region: Secondary | ICD-10-CM | POA: Diagnosis not present

## 2017-07-29 DIAGNOSIS — M5416 Radiculopathy, lumbar region: Secondary | ICD-10-CM | POA: Diagnosis not present

## 2017-07-30 ENCOUNTER — Telehealth: Payer: Self-pay | Admitting: Internal Medicine

## 2017-07-30 ENCOUNTER — Ambulatory Visit
Admission: RE | Admit: 2017-07-30 | Discharge: 2017-07-30 | Disposition: A | Discharge status: Home / Self Care | Payer: Medicare Other | Source: Ambulatory Visit | Attending: Internal Medicine | Admitting: Internal Medicine

## 2017-07-30 ENCOUNTER — Other Ambulatory Visit: Payer: Self-pay | Admitting: Internal Medicine

## 2017-07-30 ENCOUNTER — Encounter: Payer: Self-pay | Admitting: Internal Medicine

## 2017-07-30 DIAGNOSIS — J90 Pleural effusion, not elsewhere classified: Secondary | ICD-10-CM | POA: Diagnosis not present

## 2017-07-30 DIAGNOSIS — C7951 Secondary malignant neoplasm of bone: Secondary | ICD-10-CM | POA: Diagnosis not present

## 2017-07-30 DIAGNOSIS — R918 Other nonspecific abnormal finding of lung field: Secondary | ICD-10-CM | POA: Diagnosis not present

## 2017-07-30 DIAGNOSIS — C3491 Malignant neoplasm of unspecified part of right bronchus or lung: Secondary | ICD-10-CM | POA: Diagnosis not present

## 2017-07-30 DIAGNOSIS — S2241XA Multiple fractures of ribs, right side, initial encounter for closed fracture: Secondary | ICD-10-CM | POA: Diagnosis not present

## 2017-07-30 MED ORDER — IOPAMIDOL (ISOVUE-300) INJECTION 61%
75.0000 mL | Freq: Once | INTRAVENOUS | Status: AC | PRN
  Administered 2017-07-30: 75 mL via INTRAVENOUS

## 2017-07-30 NOTE — Telephone Encounter (Signed)
Appt has been scheduled for the pt to see Dr. [name] on 4/8 at 2pm. The referring office will notify the pt.

## 2017-07-31 ENCOUNTER — Other Ambulatory Visit (HOSPITAL_COMMUNITY): Payer: Self-pay | Admitting: Internal Medicine

## 2017-07-31 DIAGNOSIS — C3491 Malignant neoplasm of unspecified part of right bronchus or lung: Secondary | ICD-10-CM

## 2017-08-03 ENCOUNTER — Telehealth: Payer: Self-pay | Admitting: Pulmonary Disease

## 2017-08-03 ENCOUNTER — Other Ambulatory Visit (HOSPITAL_COMMUNITY): Payer: Self-pay | Admitting: Internal Medicine

## 2017-08-03 DIAGNOSIS — R918 Other nonspecific abnormal finding of lung field: Secondary | ICD-10-CM | POA: Diagnosis not present

## 2017-08-03 NOTE — Telephone Encounter (Signed)
BQ staffed messaged me directly today that patient needs appt with any provider first available. LM for patient on VM to return call to schedule appt with any provide as soon as possible.

## 2017-08-03 NOTE — Telephone Encounter (Signed)
Patient returned call today Scheduled appt with RA for Friday 08/07/2017 at 9:30am

## 2017-08-07 ENCOUNTER — Institutional Professional Consult (permissible substitution): Payer: Self-pay | Admitting: Pulmonary Disease

## 2017-08-07 ENCOUNTER — Other Ambulatory Visit: Payer: Self-pay | Admitting: *Deleted

## 2017-08-07 DIAGNOSIS — R911 Solitary pulmonary nodule: Secondary | ICD-10-CM

## 2017-08-10 ENCOUNTER — Inpatient Hospital Stay: Payer: Medicare Other | Attending: Internal Medicine | Admitting: Internal Medicine

## 2017-08-10 ENCOUNTER — Encounter (HOSPITAL_COMMUNITY): Payer: Self-pay

## 2017-08-10 ENCOUNTER — Emergency Department (HOSPITAL_COMMUNITY): Payer: Medicare Other

## 2017-08-10 ENCOUNTER — Inpatient Hospital Stay (HOSPITAL_COMMUNITY)
Admission: EM | Admit: 2017-08-10 | Discharge: 2017-08-14 | DRG: 180 | Disposition: A | Discharge status: Home Health | Payer: Medicare Other | Attending: Internal Medicine | Admitting: Internal Medicine

## 2017-08-10 ENCOUNTER — Other Ambulatory Visit: Payer: Self-pay

## 2017-08-10 ENCOUNTER — Institutional Professional Consult (permissible substitution): Payer: Self-pay | Admitting: Pulmonary Disease

## 2017-08-10 ENCOUNTER — Encounter: Payer: Self-pay | Admitting: Internal Medicine

## 2017-08-10 ENCOUNTER — Inpatient Hospital Stay: Payer: Medicare Other

## 2017-08-10 VITALS — BP 101/53 | HR 68 | Temp 97.7°F | Resp 20 | Ht 67.0 in | Wt 153.9 lb

## 2017-08-10 DIAGNOSIS — C7972 Secondary malignant neoplasm of left adrenal gland: Secondary | ICD-10-CM

## 2017-08-10 DIAGNOSIS — C771 Secondary and unspecified malignant neoplasm of intrathoracic lymph nodes: Secondary | ICD-10-CM

## 2017-08-10 DIAGNOSIS — C7951 Secondary malignant neoplasm of bone: Secondary | ICD-10-CM | POA: Diagnosis present

## 2017-08-10 DIAGNOSIS — R918 Other nonspecific abnormal finding of lung field: Secondary | ICD-10-CM

## 2017-08-10 DIAGNOSIS — E039 Hypothyroidism, unspecified: Secondary | ICD-10-CM | POA: Diagnosis not present

## 2017-08-10 DIAGNOSIS — C3411 Malignant neoplasm of upper lobe, right bronchus or lung: Secondary | ICD-10-CM | POA: Diagnosis not present

## 2017-08-10 DIAGNOSIS — F329 Major depressive disorder, single episode, unspecified: Secondary | ICD-10-CM | POA: Diagnosis present

## 2017-08-10 DIAGNOSIS — N179 Acute kidney failure, unspecified: Secondary | ICD-10-CM | POA: Diagnosis not present

## 2017-08-10 DIAGNOSIS — M25551 Pain in right hip: Secondary | ICD-10-CM | POA: Diagnosis not present

## 2017-08-10 DIAGNOSIS — S79911A Unspecified injury of right hip, initial encounter: Secondary | ICD-10-CM | POA: Diagnosis not present

## 2017-08-10 DIAGNOSIS — J449 Chronic obstructive pulmonary disease, unspecified: Secondary | ICD-10-CM | POA: Diagnosis not present

## 2017-08-10 DIAGNOSIS — R630 Anorexia: Secondary | ICD-10-CM | POA: Diagnosis not present

## 2017-08-10 DIAGNOSIS — R5383 Other fatigue: Secondary | ICD-10-CM

## 2017-08-10 DIAGNOSIS — D630 Anemia in neoplastic disease: Secondary | ICD-10-CM | POA: Diagnosis present

## 2017-08-10 DIAGNOSIS — R911 Solitary pulmonary nodule: Secondary | ICD-10-CM

## 2017-08-10 DIAGNOSIS — Z9889 Other specified postprocedural states: Secondary | ICD-10-CM

## 2017-08-10 DIAGNOSIS — Z8249 Family history of ischemic heart disease and other diseases of the circulatory system: Secondary | ICD-10-CM

## 2017-08-10 DIAGNOSIS — Z7982 Long term (current) use of aspirin: Secondary | ICD-10-CM

## 2017-08-10 DIAGNOSIS — J918 Pleural effusion in other conditions classified elsewhere: Secondary | ICD-10-CM | POA: Diagnosis not present

## 2017-08-10 DIAGNOSIS — Z79890 Hormone replacement therapy (postmenopausal): Secondary | ICD-10-CM

## 2017-08-10 DIAGNOSIS — R109 Unspecified abdominal pain: Secondary | ICD-10-CM | POA: Diagnosis not present

## 2017-08-10 DIAGNOSIS — Z66 Do not resuscitate: Secondary | ICD-10-CM | POA: Diagnosis present

## 2017-08-10 DIAGNOSIS — C3490 Malignant neoplasm of unspecified part of unspecified bronchus or lung: Secondary | ICD-10-CM

## 2017-08-10 DIAGNOSIS — E785 Hyperlipidemia, unspecified: Secondary | ICD-10-CM

## 2017-08-10 DIAGNOSIS — R0602 Shortness of breath: Secondary | ICD-10-CM

## 2017-08-10 DIAGNOSIS — M545 Low back pain: Secondary | ICD-10-CM | POA: Diagnosis not present

## 2017-08-10 DIAGNOSIS — Z6823 Body mass index (BMI) 23.0-23.9, adult: Secondary | ICD-10-CM

## 2017-08-10 DIAGNOSIS — J90 Pleural effusion, not elsewhere classified: Secondary | ICD-10-CM

## 2017-08-10 DIAGNOSIS — Z79899 Other long term (current) drug therapy: Secondary | ICD-10-CM

## 2017-08-10 DIAGNOSIS — E43 Unspecified severe protein-calorie malnutrition: Secondary | ICD-10-CM

## 2017-08-10 DIAGNOSIS — F17210 Nicotine dependence, cigarettes, uncomplicated: Secondary | ICD-10-CM | POA: Diagnosis present

## 2017-08-10 DIAGNOSIS — I10 Essential (primary) hypertension: Secondary | ICD-10-CM | POA: Diagnosis present

## 2017-08-10 DIAGNOSIS — G8929 Other chronic pain: Secondary | ICD-10-CM | POA: Diagnosis present

## 2017-08-10 DIAGNOSIS — N400 Enlarged prostate without lower urinary tract symptoms: Secondary | ICD-10-CM | POA: Diagnosis not present

## 2017-08-10 DIAGNOSIS — J9811 Atelectasis: Secondary | ICD-10-CM | POA: Diagnosis not present

## 2017-08-10 DIAGNOSIS — D649 Anemia, unspecified: Secondary | ICD-10-CM | POA: Diagnosis present

## 2017-08-10 DIAGNOSIS — J910 Malignant pleural effusion: Secondary | ICD-10-CM | POA: Diagnosis not present

## 2017-08-10 DIAGNOSIS — Z87891 Personal history of nicotine dependence: Secondary | ICD-10-CM

## 2017-08-10 DIAGNOSIS — M48061 Spinal stenosis, lumbar region without neurogenic claudication: Secondary | ICD-10-CM | POA: Diagnosis present

## 2017-08-10 LAB — CBC WITH DIFFERENTIAL (CANCER CENTER ONLY)
BASOS ABS: 0.1 10*3/uL (ref 0.0–0.1)
BASOS PCT: 1 %
EOS ABS: 0.3 10*3/uL (ref 0.0–0.5)
EOS PCT: 2 %
HCT: 37.2 % — ABNORMAL LOW (ref 38.4–49.9)
Hemoglobin: 12.1 g/dL — ABNORMAL LOW (ref 13.0–17.1)
Lymphocytes Relative: 7 %
Lymphs Abs: 1.3 10*3/uL (ref 0.9–3.3)
MCH: 28.9 pg (ref 27.2–33.4)
MCHC: 32.5 g/dL (ref 32.0–36.0)
MCV: 88.9 fL (ref 79.3–98.0)
MONO ABS: 1 10*3/uL — AB (ref 0.1–0.9)
Monocytes Relative: 6 %
Neutro Abs: 15.5 10*3/uL — ABNORMAL HIGH (ref 1.5–6.5)
Neutrophils Relative %: 84 %
PLATELETS: 338 10*3/uL (ref 140–400)
RBC: 4.18 MIL/uL — AB (ref 4.20–5.82)
RDW: 14 % (ref 11.0–14.6)
WBC: 18.2 10*3/uL — AB (ref 4.0–10.3)

## 2017-08-10 LAB — COMPREHENSIVE METABOLIC PANEL
ALK PHOS: 77 U/L (ref 38–126)
ALT: 13 U/L — ABNORMAL LOW (ref 17–63)
AST: 18 U/L (ref 15–41)
Albumin: 3 g/dL — ABNORMAL LOW (ref 3.5–5.0)
Anion gap: 10 (ref 5–15)
BILIRUBIN TOTAL: 0.4 mg/dL (ref 0.3–1.2)
BUN: 28 mg/dL — ABNORMAL HIGH (ref 6–20)
CALCIUM: 9.3 mg/dL (ref 8.9–10.3)
CO2: 22 mmol/L (ref 22–32)
Chloride: 104 mmol/L (ref 101–111)
Creatinine, Ser: 1.55 mg/dL — ABNORMAL HIGH (ref 0.61–1.24)
GFR, EST AFRICAN AMERICAN: 47 mL/min — AB (ref >60)
GFR, EST NON AFRICAN AMERICAN: 40 mL/min — AB (ref >60)
GLUCOSE: 107 mg/dL — AB (ref 65–99)
Potassium: 4.1 mmol/L (ref 3.5–5.1)
SODIUM: 136 mmol/L (ref 135–145)
TOTAL PROTEIN: 6.5 g/dL (ref 6.5–8.1)

## 2017-08-10 LAB — CBC WITH DIFFERENTIAL/PLATELET
BASOS ABS: 0 10*3/uL (ref 0.0–0.1)
BASOS PCT: 0 %
EOS ABS: 0.3 10*3/uL (ref 0.0–0.7)
EOS PCT: 1 %
HCT: 35.4 % — ABNORMAL LOW (ref 39.0–52.0)
Hemoglobin: 11.2 g/dL — ABNORMAL LOW (ref 13.0–17.0)
LYMPHS PCT: 8 %
Lymphs Abs: 1.5 10*3/uL (ref 0.7–4.0)
MCH: 28.7 pg (ref 26.0–34.0)
MCHC: 31.6 g/dL (ref 30.0–36.0)
MCV: 90.8 fL (ref 78.0–100.0)
Monocytes Absolute: 1.5 10*3/uL — ABNORMAL HIGH (ref 0.1–1.0)
Monocytes Relative: 7 %
Neutro Abs: 16.7 10*3/uL — ABNORMAL HIGH (ref 1.7–7.7)
Neutrophils Relative %: 84 %
PLATELETS: 345 10*3/uL (ref 150–400)
RBC: 3.9 MIL/uL — AB (ref 4.22–5.81)
RDW: 14.2 % (ref 11.5–15.5)
WBC: 19.9 10*3/uL — AB (ref 4.0–10.5)

## 2017-08-10 LAB — CMP (CANCER CENTER ONLY)
ALBUMIN: 2.8 g/dL — AB (ref 3.5–5.0)
ALT: 11 U/L (ref 0–55)
AST: 18 U/L (ref 5–34)
Alkaline Phosphatase: 85 U/L (ref 40–150)
Anion gap: 9 (ref 3–11)
BILIRUBIN TOTAL: 0.3 mg/dL (ref 0.2–1.2)
BUN: 27 mg/dL — AB (ref 7–26)
CO2: 25 mmol/L (ref 22–29)
CREATININE: 1.67 mg/dL — AB (ref 0.70–1.30)
Calcium: 10.2 mg/dL (ref 8.4–10.4)
Chloride: 104 mmol/L (ref 98–109)
GFR, EST NON AFRICAN AMERICAN: 37 mL/min — AB (ref >60)
GFR, Est AFR Am: 43 mL/min — ABNORMAL LOW (ref >60)
GLUCOSE: 125 mg/dL (ref 70–140)
POTASSIUM: 4.1 mmol/L (ref 3.5–5.1)
Sodium: 138 mmol/L (ref 136–145)
TOTAL PROTEIN: 6.8 g/dL (ref 6.4–8.3)

## 2017-08-10 MED ORDER — IOPAMIDOL (ISOVUE-300) INJECTION 61%
100.0000 mL | Freq: Once | INTRAVENOUS | Status: AC | PRN
  Administered 2017-08-10: 60 mL via INTRAVENOUS

## 2017-08-10 MED ORDER — SODIUM CHLORIDE 0.9 % IV BOLUS
1000.0000 mL | Freq: Once | INTRAVENOUS | Status: AC
  Administered 2017-08-10: 1000 mL via INTRAVENOUS

## 2017-08-10 NOTE — ED Notes (Signed)
Bed: WA06 Expected date:  Expected time:  Means of arrival:  Comments: EMS-uncontrolled pain

## 2017-08-10 NOTE — ED Provider Notes (Signed)
[facility] DEPT Provider Note  CSN: [id number] Arrival date & time: 08/10/17 1858  Chief Complaint(s) Back Pain; Chest Pain; and Hip Pain  HPI Peter Velasquez is a 84 y.o. male with a history of chronic back pain who presents to the emergency department with upper and lower back pain, left chest wall pain and right hip pain in the setting of recurrent falls over the past several weeks.  Patient reports a history of unsteady gait which has worsened since the pain has persisted.  Pain is constant in nature but exacerbated with certain positions and movement.  Alleviated by narcotic pain medicine.  He denies any head trauma or loss of consciousness.  No neck pain.  Reports that he saw his primary care provider a few weeks ago who obtain imaging notable for rib fracture and concern for metastatic disease.  Additionally, he reports decreased appetite for several weeks with a reported 10-15 lbs wt loss in 1 month.      Past Medical History Past Medical History:  Diagnosis Date  . BENIGN PROSTATIC HYPERTROPHY 01/13/2007   Qualifier: Diagnosis of  By: Jenny Reichmann MD, Hunt Oris   . COPD 01/09/2007   Qualifier: Diagnosis of  By: Elveria Royals   . DEGENERATIVE JOINT DISEASE 01/09/2007   Qualifier: Diagnosis of  By: Elveria Royals   . DEPRESSION 01/09/2007   Qualifier: Diagnosis of  By: Elveria Royals   . DIVERTICULOSIS, COLON 01/09/2007   Qualifier: Diagnosis of  By: Elveria Royals   . ERECTILE DYSFUNCTION 01/09/2007   Qualifier: Diagnosis of  By: Elveria Royals   . GERD 01/09/2007   Qualifier: Diagnosis of  By: Elveria Royals   . HYPERLIPIDEMIA 01/09/2007   Qualifier: Diagnosis of  By: Elveria Royals   . HYPOTHYROIDISM 01/09/2007   Qualifier: Diagnosis of  By: Elveria Royals   . PALPITATIONS, HX OF 01/09/2007   Qualifier: Diagnosis of  By: Elveria Royals   . 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 Suicide attempt by drug ingestion ([place])   . MDD (major depressive disorder), recurrent severe, without psychosis ([place]) 04/29/2017  . Adjustment disorder with mixed disturbance of emotions and conduct 04/24/2017  . Suicide attempt ([place]) 04/23/2017  . Overdose of acetaminophen 04/23/2017  . Overdose 04/23/2017  . BENIGN PROSTATIC HYPERTROPHY 01/13/2007  . Hypothyroidism 01/09/2007  . HYPERLIPIDEMIA 01/09/2007  . ERECTILE DYSFUNCTION 01/09/2007  . DEPRESSION 01/09/2007  . TINNITUS 01/09/2007  . COPD 01/09/2007  . GERD 01/09/2007  . DIVERTICULOSIS, COLON 01/09/2007  . Osteoarthritis 01/09/2007  . PALPITATIONS, HX OF 01/09/2007   Home Medication(s) Prior to Admission medications   Medication Sig Start Date End Date Taking? Authorizing Provider  aspirin EC 81 MG tablet Take 81 mg by mouth at bedtime.    Yes [provider]  atenolol (TENORMIN) 50 MG tablet Take 50 mg by mouth daily.   Yes [provider]  benazepril (LOTENSIN) 20 MG tablet Take 20 mg by mouth daily.   Yes [provider]  citalopram (CELEXA) 20 MG tablet Take 20 mg by mouth daily.   Yes [provider]  hydrochlorothiazide (HYDRODIURIL) 25 MG tablet Take 25 mg by mouth daily. 07/15/11  Yes [provider]  HYDROcodone-acetaminophen (NORCO) 10-325 MG tablet Take 1 tablet by mouth 3 (three) times daily as needed for moderate pain or severe pain.  08/05/17  Yes [provider]  levothyroxine (SYNTHROID, LEVOTHROID) 125 MCG tablet Take 1 tablet by mouth daily. 07/05/17  Yes [provider]  metoprolol succinate (TOPROL-XL) 50 MG 24 hr tablet Take 50 mg by mouth daily.   Yes [provider]  omeprazole (PRILOSEC) 20 MG capsule Take 20 mg by mouth daily.   Yes [provider]  simvastatin (ZOCOR) 40 MG tablet Take 40 mg by mouth daily.   Yes [provider]                                                                                                                                     Past Surgical History History reviewed. No pertinent surgical history. Family History Family History  Problem Relation Age of Onset  . Stroke Father   . Hypertension Father   . Other Sister     Social History Social History   Tobacco Use  . Smoking status: Former Smoker    Last attempt to quit: 01/23/1998    Years since quitting: 19.5  . Smokeless tobacco: Never Used  Substance Use Topics  . Alcohol use: Yes    Comment: daily use  . Drug use: No   Allergies Patient has no known allergies.  Review of Systems Review of Systems   All other systems are reviewed and are negative for acute change except as noted in the HPI  Physical Exam Vital Signs  I have reviewed the triage vital signs BP 126/69 (BP Location: Right Arm)   Pulse 77   Temp 98.9 F (37.2 C) (Oral)   Resp 16   Ht 5\' 7"  (1.702 m)   Wt 69.4 kg (153 lb)   SpO2 95%   BMI 23.96 kg/m   Physical Exam  Constitutional: He is oriented to person, place, and time. He appears well-developed and well-nourished. No distress.  HENT:  Head: Normocephalic and atraumatic.  Right Ear: External ear normal.  Left Ear: External ear normal.  Nose: Nose normal.  Mouth/Throat: Mucous membranes are normal. No trismus in the jaw.  Eyes: Conjunctivae and EOM are normal. No scleral icterus.  Neck: Normal range of motion and phonation normal.  Cardiovascular: Normal rate and regular rhythm.  Pulmonary/Chest: Effort normal. No stridor. No respiratory distress. He exhibits tenderness.    Abdominal: He exhibits no distension.  Musculoskeletal: Normal range of motion. He exhibits no edema.       Cervical back: He exhibits tenderness. He exhibits no bony tenderness.       Thoracic back: He exhibits tenderness and bony tenderness.       Back:  Neurological: He is alert and oriented to person, place, and  time.  Spine Exam: Strength: 5/5 throughout LE bilaterally  Sensation: Intact to light touch in proximal and distal LE bilaterally Reflexes: 1+ quadriceps and achilles reflexes   Skin: He is not diaphoretic.  Psychiatric: He has a normal mood and  affect. His behavior is normal.  Vitals reviewed.   ED Results and Treatments Labs (all labs ordered are listed, but only abnormal results are displayed) Labs Reviewed  CBC WITH DIFFERENTIAL/PLATELET - Abnormal; Notable for the following components:      Result Value   WBC 19.9 (*)    RBC 3.90 (*)    Hemoglobin 11.2 (*)    HCT 35.4 (*)    Neutro Abs 16.7 (*)    Monocytes Absolute 1.5 (*)    All other components within normal limits  COMPREHENSIVE METABOLIC PANEL - Abnormal; Notable for the following components:   Glucose, Bld 107 (*)    BUN 28 (*)    Creatinine, Ser 1.55 (*)    Albumin 3.0 (*)    ALT 13 (*)    GFR calc non Af Amer 40 (*)    GFR calc Af Amer 47 (*)    All other components within normal limits  POC OCCULT BLOOD, ED                                                                                                                         EKG  EKG Interpretation  Date/Time:    Ventricular Rate:    PR Interval:    QRS Duration:   QT Interval:    QTC Calculation:   R Axis:     Text Interpretation:        Radiology Ct Chest W Contrast  Result Date: 08/10/2017 CLINICAL DATA:  84 year old male with multiple falls over the past 3 weeks. Right upper lobe pulmonary mass seen on recent CT. Patient scheduled for PET-CT. EXAM: CT CHEST WITH CONTRAST TECHNIQUE: Multidetector CT imaging of the chest was performed during intravenous contrast administration. CONTRAST:  68mL ISOVUE-300 IOPAMIDOL (ISOVUE-300) INJECTION 61% COMPARISON:  07/30/2017 CT FINDINGS: Cardiovascular: Normal size heart without pericardial effusion. There is coronary arteriosclerosis involving the left main, lad and RCA. And aortic atherosclerosis. No  aneurysm or dissection. Suboptimal opacification of the pulmonary arteries for assessment of pulmonary embolus. Mediastinum/Nodes: No thyromegaly or discrete thyroid mass. Unremarkable CT appearance of the esophagus. No axillary adenopathy. Enlarged 14 mm right paratracheal short axis lymph node, 14 mm right hilar lymph node and 11 mm subcarinal lymph node. No significant progression since recent comparison. No left hilar adenopathy. Lungs/Pleura: Redemonstration of small to moderate right-sided pleural effusion with adjacent compressive atelectasis. Spiculated masslike abnormality in the posterior segment of right upper lobe measuring 4.3 x 2.4 cm is redemonstrated and unchanged. Subpleural areas of interstitial fibrosis and mild peribronchial thickening is seen bilaterally. Upper Abdomen: Stable too small to further characterize right hepatic hypodensity measuring 5 mm. Water attenuating partially included left upper pole renal cyst measuring 7.4 cm. Musculoskeletal: Osseous metastasis noted with slightly expansile lytic lesion in the posterior left second rib, lateral left seventh rib with pathologic fracture, right eighth rib, lytic and sclerotic L2 vertebral lesion and lytic posterior T9 vertebral lesion. Additionally there is a lytic abnormality involving the  inferior right scapula without pathologic fracture. IMPRESSION: 1. Unchanged 4.3 x 2.4 cm spiculated mass abutting the major fissure in the posterior segment of right upper lobe. Associated small to moderate right-sided pleural effusion with adjacent compressive atelectasis. 2. Mediastinal and right hilar adenopathy is stable. 3. Osseous metastasis with lytic abnormalities noted of the right scapula, right eighth rib in addition to previously noted left-sided second and seventh rib fractures as well as lytic abnormalities involving the posterior T9 and L2 vertebral bodies. No pathologic fracture on the right. Aortic Atherosclerosis (ICD10-I70.0).  Electronically Signed   By: Ashley Royalty M.D.   On: 08/10/2017 22:01   Dg Hip Unilat W Or Wo Pelvis 2-3 Views Right  Result Date: 08/10/2017 CLINICAL DATA:  Right hip pain after 2 falls today. EXAM: DG HIP (WITH OR WITHOUT PELVIS) 2-3V RIGHT COMPARISON:  None. FINDINGS: The cortical margins of the bony pelvis and right hip are intact. No evidence of fracture. Pubic symphysis and sacroiliac joints are congruent. Both femoral heads are well-seated in the respective acetabula. Mild degenerative change of both hips with acetabular spurring. Penile prosthesis is incidentally noted. IMPRESSION: No fracture of the pelvis or right hip. Electronically Signed   By: Jeb Levering M.D.   On: 08/10/2017 21:02   Pertinent labs & imaging results that were available during my care of the patient were reviewed by me and considered in my medical decision making (see chart for details).  Medications Ordered in ED Medications  sodium chloride 0.9 % bolus 1,000 mL (has no administration in time range)  iopamidol (ISOVUE-300) 61 % injection 100 mL (60 mLs Intravenous Contrast Given 08/10/17 2119)                                                                                                                                    Procedures Procedures  (including critical care time)  Medical Decision Making / ED Course I have reviewed the nursing notes for this encounter and the patient's prior records (if available in EHR or on provided paperwork).    Work-up is consistent with evidence of metastatic disease to multiple ribs on the left and the right scapula which is likely causing the patient's thoracic pain.  In addition we noted a right moderate pleural effusion increased from previous imaging obtained 1/2 weeks ago.  Plain film of the right hip unremarkable.  Labs revealed significant leukocytosis at 19,000 with 1 g drop in hemoglobin and 8 hours from recent labs obtained in oncology clinic.  Patient denies any  bloody bowel movements.  Hemoccult will be obtained.  CMP revealed evidence of AKI likely secondary to dehydration and poor oral intake.  Given the metastatic disease and decreased appetite with right pleural effusion, will discuss case with medicine for admission.  Final Clinical Impression(s) / ED Diagnoses Final diagnoses:  Bone metastases (HCC)  Pleural effusion on right  AKI (acute kidney injury) ([place])  This chart was dictated using voice recognition software.  Despite best efforts to proofread,  errors can occur which can change the documentation meaning.   Fatima Blank, MD 08/10/17 [phone]

## 2017-08-10 NOTE — ED Triage Notes (Signed)
Patient BIB EMS from home with complaints of right muscular abdominal pain with radiation to the groin. Patient denies fall/injury today. Patient recently was discharged from the hospital on Friday with c/o multiple falls over the past 3 weeks. Patient recently diagnosed with lung mass- unsure if cancerous, PET scan scheduled for 08/11/17. Patient Denies blood thinners. Patient denies SOB/CP. Patient denies bruising/crepitus.

## 2017-08-10 NOTE — ED Notes (Signed)
Patient transported to CT 

## 2017-08-10 NOTE — Progress Notes (Signed)
[place] [phone]   [phone]  CONSULT NOTE  REFERRING PHYSICIAN: Dr. [name]  REASON FOR CONSULTATION:  84 years old white male with highly suspicious lung cancer.  HPI Peter Velasquez is a 84 y.o. male with past medical history significant for COPD, dyslipidemia, hypothyroidism, depression, benign prostatic hypertrophy, degenerative joint disease, diverticulosis, early melanoma of the scalp status post resection in January 2019 and remote history for smoking.  The patient also has a history of his primary stenosis and currently under the care of Dr. Nelva Bush and receiving steroid injection and his pain medication from him. The patient mentioned that he had a fall at home 3 weeks ago and had pain on the left side of the chest.  He was also complaining of shortness of breath he was seen by his primary care physician for evaluation and bilateral rib x-ray was performed on July 29, 2017 abnormal appearance of the right upper lobe worrisome for malignancy.  There was also small right pleural effusion.  This was followed by CT scan of the chest with contrast on July 30, 2017 and that showed a spiculated 4.6 cm posterior right upper lobe lung mass suspicious for primary bronchogenic carcinoma, possibly extending into the superior segment of the right lower lobe.  There was a small dependent right pleural effusion suspicious for malignant effusion.  The scan also showed right hilar, subcarinal and right paratracheal lymphadenopathy suspicious for nodal metastasis.  There was also lytic osseous lesions in the left second and seventh ribs as well as T9 and L2 vertebral body suspicious for osseous metastasis. He is scheduled to have a PET scan on August 12, 2017.  The patient was referred to me today for evaluation and recommendation regarding his condition. When seen today he continues to complain of lack of appetite and weight loss.  He also has pain all over  his body but mainly in the lower back and left side of the chest.  He also has shortness of breath at baseline increased with exertion with mild cough and no hemoptysis.  He denied having any headache but has occasional blurry vision.  The patient has no fever or chills.  He has no nausea, vomiting, diarrhea or constipation. Family history significant for father with a stroke and hypertension, mother died in her 50s from old age, brother had a stroke and sister had brain cancer. The patient is married and has 3 children one deceased.  He was accompanied today by his wife Peter Velasquez and daughter Peter Velasquez. He worked in several jobs including many years in the [employer].  He has a history for smoking less than 1 pack/day for around 25 years but quit in 1997.  He also used to drink alcohol at regular basis but quit in 2018.  He has no history of drug abuse. HPI  Past Medical History:  Diagnosis Date  . BENIGN PROSTATIC HYPERTROPHY 01/13/2007   Qualifier: Diagnosis of  By: Jenny Reichmann MD, Hunt Oris   . COPD 01/09/2007   Qualifier: Diagnosis of  By: Elveria Royals   . DEGENERATIVE JOINT DISEASE 01/09/2007   Qualifier: Diagnosis of  By: Elveria Royals   . DEPRESSION 01/09/2007   Qualifier: Diagnosis of  By: Elveria Royals   . DIVERTICULOSIS, COLON 01/09/2007   Qualifier: Diagnosis of  By: Elveria Royals   . ERECTILE DYSFUNCTION 01/09/2007   Qualifier: Diagnosis of  By: Elveria Royals   . GERD 01/09/2007   Qualifier:  Diagnosis of  By: Elveria Royals   . HYPERLIPIDEMIA 01/09/2007   Qualifier: Diagnosis of  By: Elveria Royals   . HYPOTHYROIDISM 01/09/2007   Qualifier: Diagnosis of  By: Elveria Royals   . PALPITATIONS, HX OF 01/09/2007   Qualifier: Diagnosis of  By: Elveria Royals   . TINNITUS 01/09/2007   Qualifier: Diagnosis of  By: Elveria Royals     No past surgical history on file.  Family History  Problem Relation Age of Onset  . Stroke  Father   . Hypertension Father   . Other Sister     Social History Social History   Tobacco Use  . Smoking status: Former Smoker    Last attempt to quit: 01/23/1998    Years since quitting: 19.5  . Smokeless tobacco: Never Used  Substance Use Topics  . Alcohol use: Yes    Comment: daily use  . Drug use: No    No Known Allergies  Current Outpatient Medications  Medication Sig Dispense Refill  . aspirin EC 81 MG tablet Take 81 mg by mouth daily.    Marland Kitchen atenolol (TENORMIN) 50 MG tablet Take 50 mg by mouth daily.    . benazepril (LOTENSIN) 20 MG tablet Take 20 mg by mouth daily.    . citalopram (CELEXA) 20 MG tablet Take 20 mg by mouth daily.    . hydrochlorothiazide (HYDRODIURIL) 25 MG tablet Take 25 mg by mouth daily.    Marland Kitchen latanoprost (XALATAN) 0.005 % ophthalmic solution Place 1 drop into both eyes at bedtime.    Marland Kitchen omeprazole (PRILOSEC) 20 MG capsule Take 20 mg by mouth daily.    . simvastatin (ZOCOR) 40 MG tablet Take 40 mg by mouth daily.    Marland Kitchen HYDROcodone-acetaminophen (NORCO) 10-325 MG tablet Take 1 tablet by mouth 3 (three) times daily as needed.    Marland Kitchen levothyroxine (SYNTHROID, LEVOTHROID) 125 MCG tablet Take 1 tablet by mouth daily.    . metoprolol succinate (TOPROL-XL) 50 MG 24 hr tablet Take 50 mg by mouth daily.     No current facility-administered medications for this visit.     Review of Systems  Constitutional: positive for anorexia, fatigue and weight loss Eyes: negative Ears, nose, mouth, throat, and face: negative Respiratory: positive for cough, dyspnea on exertion and pleurisy/chest pain Cardiovascular: negative Gastrointestinal: negative Genitourinary:negative Integument/breast: negative Hematologic/lymphatic: negative Musculoskeletal:positive for back pain and bone pain Neurological: negative Behavioral/Psych: negative Endocrine: negative Allergic/Immunologic: negative  Physical Exam  YFV:CBSWH, healthy, no distress, well developed, anxious, ill  looking and malnourished SKIN: skin color, texture, turgor are normal, no rashes or significant lesions HEAD: Normocephalic, No masses, lesions, tenderness or abnormalities EYES: normal, PERRLA, Conjunctiva are pink and non-injected EARS: External ears normal, Canals clear OROPHARYNX:no exudate, no erythema and lips, buccal mucosa, and tongue normal  NECK: supple, no adenopathy, no JVD LYMPH:  no palpable lymphadenopathy, no hepatosplenomegaly LUNGS: coarse sounds heard, decreased breath sounds HEART: regular rate & rhythm, no murmurs and no gallops ABDOMEN:abdomen soft, non-tender, normal bowel sounds and no masses or organomegaly BACK: No CVA tenderness, Range of motion is normal EXTREMITIES:no joint deformities, effusion, or inflammation, no edema  NEURO: alert & oriented x 3 with fluent speech, no focal motor/sensory deficits  PERFORMANCE STATUS: ECOG 1-2  LABORATORY DATA: Lab Results  Component Value Date   WBC 18.2 (H) 08/10/2017   HGB 14.3 04/27/2017   HCT 37.2 (L) 08/10/2017   MCV 88.9 08/10/2017   PLT 338 08/10/2017  Chemistry      Component Value Date/Time   NA 138 08/10/2017 1319   K 4.1 08/10/2017 1319   CL 104 08/10/2017 1319   CO2 25 08/10/2017 1319   BUN 27 (H) 08/10/2017 1319   CREATININE 1.67 (H) 08/10/2017 1319      Component Value Date/Time   CALCIUM 10.2 08/10/2017 1319   ALKPHOS 85 08/10/2017 1319   AST 18 08/10/2017 1319   ALT 11 08/10/2017 1319   BILITOT 0.3 08/10/2017 1319       RADIOGRAPHIC STUDIES: Dg Ribs Bilateral W/chest  Result Date: 07/29/2017 CLINICAL DATA:  Posterior ribcage injury after a fall 1 week ago. Symptoms are greatest on the left. The patient also describes shortness of breath. EXAM: BILATERAL RIBS AND CHEST - 4+ VIEW COMPARISON:  Left rib series of January 29, 2015 FINDINGS: The lungs are well-expanded. There is new patchy increased density in the right upper lobe. There is a new small right pleural effusion. There is  no pneumothorax. The left lung is well-expanded and clear. The heart and pulmonary vascularity are normal. There is calcification in the wall of the aortic arch. Right rib detail images reveal no acute displaced fracture. On the left there is a mildly displaced fracture of the lateral aspect of the seventh rib. No definite other rib fractures are observed. IMPRESSION: Abnormal appearance of the right upper lobe worrisome for malignancy though pneumonia could present in a similar fashion. There is also a small right pleural effusion. If the patient has symptoms of typical pneumonia, a follow-up chest x-ray in 2-3 weeks following antibiotic therapy would be recommended. If there are no typical pneumonia symptoms, chest CT scanning now is recommended. Mildly displaced fracture of the lateral aspect of the left seventh rib. No pneumothorax. Thoracic aortic atherosclerosis. These results will be called to the ordering clinician or representative by the Radiologist Assistant, and communication documented in the PACS or zVision Dashboard. Electronically Signed   By: David  Martinique M.D.   On: 07/29/2017 16:48   Ct Chest W Contrast  Result Date: 07/30/2017 CLINICAL DATA:  Lung mass. Rib fracture after recent fall. Dyspnea. EXAM: CT CHEST WITH CONTRAST TECHNIQUE: Multidetector CT imaging of the chest was performed during intravenous contrast administration. Creatinine was obtained on site at [facility] at [address] Results: Creatinine 1.3 mg/dL. CONTRAST:  28mL ISOVUE-300 IOPAMIDOL (ISOVUE-300) INJECTION 61% COMPARISON:  Chest radiograph from one day prior. FINDINGS: Cardiovascular: Normal heart size. No significant pericardial fluid/thickening. Left main, left anterior descending and right coronary atherosclerosis. Atherosclerotic nonaneurysmal thoracic aorta. Normal caliber pulmonary arteries. No central pulmonary emboli. Mediastinum/Nodes: No discrete thyroid nodules. Unremarkable esophagus. No  axillary adenopathy. Enlarged 1.5 cm right paratracheal node (series 2/image 64). Enlarged 1.4 cm subcarinal node (series 2/image 79). Enlarged 1.6 cm right hilar node (series 2/image 74). No left hilar adenopathy. Lungs/Pleura: No pneumothorax. Small dependent right pleural effusion. No left pleural effusion. Mild centrilobular emphysema with mild diffuse bronchial wall thickening. There is a spiculated 4.6 x 2.4 cm posterior right upper lobe lung mass (series 8/image 51), which abuts the superior aspect of the right major fissure and involves the visceral pleura, with possible extension into the superior segment right lower lobe (series 8/image 58). No additional lung masses. No significant pulmonary nodules. Patchy subpleural reticulation and ground-glass attenuation throughout both lungs without significant traction bronchiectasis or frank honeycombing. Upper abdomen: Subcentimeter hypodense peripheral right liver lobe lesion, too small to characterize (series 2/image 166). Partially visualized simple exophytic 7.6 cm  upper left renal cyst. Musculoskeletal: There are mildly expansile aggressive appearing lytic lesions in the posterior left second rib and lateral left seventh rib, with associated pathologic lateral left seventh rib fracture. Faint lytic and sclerotic L2 vertebral lesion (series 6/image 104). Faint lytic posterior T9 vertebral lesion (series 6/image 106). Mild thoracic spondylosis. IMPRESSION: 1. Spiculated 4.6 cm posterior right upper lobe lung mass suspicious for primary bronchogenic carcinoma, possibly extending into the superior segment right lower lobe. 2. Small dependent right pleural effusion, cannot exclude malignant effusion. 3. Right hilar, subcarinal and right paratracheal lymphadenopathy suspicious for nodal metastases. 4. Lytic osseous lesions in the left second and seventh ribs and T9 and L2 vertebral bodies, suspicious for osseous metastases. Associated pathologic lateral left  seventh rib fracture. 5. Left main and two-vessel coronary atherosclerosis. 6. Nonspecific patchy subpleural reticulation and ground-glass attenuation throughout both lungs, cannot exclude an underlying interstitial lung disease such as nonspecific interstitial pneumonia (NSIP) or early usual interstitial pneumonia (UIP). Aortic Atherosclerosis (ICD10-I70.0) and Emphysema (ICD10-J43.9). Electronically Signed   By: Ilona Sorrel M.D.   On: 07/30/2017 11:39    ASSESSMENT: This is a very pleasant 84 years old white male with highly suspicious metastatic lung cancer, probably non-small cell carcinoma presented with right upper lobe lung mass in addition to right hilar and mediastinal lymphadenopathy as well as suspicious right malignant pleural effusion and lytic bone lesions.   PLAN: I had a lengthy discussion with the patient and his family today about his current condition as well as treatment options. I personally and independently reviewed the scan images and discussed the results and showed the images to the patient and his family today. I recommended for the patient to complete the staging workup by proceeding with a PET scan as a scheduled on 08/12/2017. I would also order MRI of the brain to rule out brain metastasis. The patient is a scheduled to see Dr. Lamonte Sakai tomorrow for evaluation and consideration of bronchoscopy with endobronchial ultrasound and biopsy for tissue diagnosis.  He may also consider right-sided thoracentesis if there is sufficient amount of pleural fluid for diagnostic purposes. I will arrange for the patient to come back for follow-up visit in 7-10 days for more detailed discussion of his treatment options based on the final pathology and staging workup. If the final pathology is consistent with adenocarcinoma, we will send the tissue block for molecular studies and PDL 1 expression. For pain management, the patient will continue with his current pain medication as prescribed by  Dr. Nelva Bush.  He may also need referral to radiation oncology for consideration of palliative radiotherapy to the metastatic bone lesion after confirmation of the tissue diagnosis. The patient was advised to call immediately if he has any concerning symptoms in the interval. The patient voices understanding of current disease status and treatment options and is in agreement with the current care plan.  All questions were answered. The patient knows to call the clinic with any problems, questions or concerns. We can certainly see the patient much sooner if necessary.  Thank you so much for allowing me to participate in the care of Peter Velasquez. I will continue to follow up the patient with you and assist in his care.  I spent 40 minutes counseling the patient face to face. The total time spent in the appointment was 60 minutes.  Disclaimer: This note was dictated with voice recognition software. Similar sounding words can inadvertently be transcribed and may not be corrected upon review.   [name]  K Teran Knittle August 10, 2017, 2:51 PM

## 2017-08-11 ENCOUNTER — Institutional Professional Consult (permissible substitution): Payer: Self-pay | Admitting: Emergency Medicine

## 2017-08-11 ENCOUNTER — Inpatient Hospital Stay (HOSPITAL_COMMUNITY): Payer: Medicare Other

## 2017-08-11 ENCOUNTER — Telehealth: Payer: Self-pay | Admitting: Internal Medicine

## 2017-08-11 DIAGNOSIS — K8020 Calculus of gallbladder without cholecystitis without obstruction: Secondary | ICD-10-CM | POA: Diagnosis not present

## 2017-08-11 DIAGNOSIS — J910 Malignant pleural effusion: Secondary | ICD-10-CM | POA: Diagnosis not present

## 2017-08-11 DIAGNOSIS — I10 Essential (primary) hypertension: Secondary | ICD-10-CM | POA: Diagnosis present

## 2017-08-11 DIAGNOSIS — D649 Anemia, unspecified: Secondary | ICD-10-CM | POA: Diagnosis present

## 2017-08-11 DIAGNOSIS — C799 Secondary malignant neoplasm of unspecified site: Secondary | ICD-10-CM | POA: Diagnosis not present

## 2017-08-11 DIAGNOSIS — F17210 Nicotine dependence, cigarettes, uncomplicated: Secondary | ICD-10-CM | POA: Diagnosis present

## 2017-08-11 DIAGNOSIS — J90 Pleural effusion, not elsewhere classified: Secondary | ICD-10-CM | POA: Diagnosis not present

## 2017-08-11 DIAGNOSIS — J449 Chronic obstructive pulmonary disease, unspecified: Secondary | ICD-10-CM | POA: Diagnosis present

## 2017-08-11 DIAGNOSIS — N179 Acute kidney failure, unspecified: Secondary | ICD-10-CM | POA: Diagnosis present

## 2017-08-11 DIAGNOSIS — F329 Major depressive disorder, single episode, unspecified: Secondary | ICD-10-CM | POA: Diagnosis present

## 2017-08-11 DIAGNOSIS — Z66 Do not resuscitate: Secondary | ICD-10-CM | POA: Diagnosis present

## 2017-08-11 DIAGNOSIS — E43 Unspecified severe protein-calorie malnutrition: Secondary | ICD-10-CM | POA: Diagnosis present

## 2017-08-11 DIAGNOSIS — E039 Hypothyroidism, unspecified: Secondary | ICD-10-CM

## 2017-08-11 DIAGNOSIS — Z7982 Long term (current) use of aspirin: Secondary | ICD-10-CM | POA: Diagnosis not present

## 2017-08-11 DIAGNOSIS — C384 Malignant neoplasm of pleura: Secondary | ICD-10-CM | POA: Diagnosis not present

## 2017-08-11 DIAGNOSIS — Z6823 Body mass index (BMI) 23.0-23.9, adult: Secondary | ICD-10-CM | POA: Diagnosis not present

## 2017-08-11 DIAGNOSIS — Z79899 Other long term (current) drug therapy: Secondary | ICD-10-CM | POA: Diagnosis not present

## 2017-08-11 DIAGNOSIS — Z8249 Family history of ischemic heart disease and other diseases of the circulatory system: Secondary | ICD-10-CM | POA: Diagnosis not present

## 2017-08-11 DIAGNOSIS — C3411 Malignant neoplasm of upper lobe, right bronchus or lung: Secondary | ICD-10-CM | POA: Diagnosis present

## 2017-08-11 DIAGNOSIS — M48061 Spinal stenosis, lumbar region without neurogenic claudication: Secondary | ICD-10-CM | POA: Diagnosis present

## 2017-08-11 DIAGNOSIS — Z79890 Hormone replacement therapy (postmenopausal): Secondary | ICD-10-CM | POA: Diagnosis not present

## 2017-08-11 DIAGNOSIS — R918 Other nonspecific abnormal finding of lung field: Secondary | ICD-10-CM | POA: Diagnosis not present

## 2017-08-11 DIAGNOSIS — C7951 Secondary malignant neoplasm of bone: Secondary | ICD-10-CM | POA: Diagnosis present

## 2017-08-11 DIAGNOSIS — D630 Anemia in neoplastic disease: Secondary | ICD-10-CM | POA: Diagnosis present

## 2017-08-11 DIAGNOSIS — C3490 Malignant neoplasm of unspecified part of unspecified bronchus or lung: Secondary | ICD-10-CM | POA: Diagnosis not present

## 2017-08-11 DIAGNOSIS — G8929 Other chronic pain: Secondary | ICD-10-CM | POA: Diagnosis present

## 2017-08-11 LAB — CBC
HCT: 34.1 % — ABNORMAL LOW (ref 39.0–52.0)
HEMOGLOBIN: 10.8 g/dL — AB (ref 13.0–17.0)
MCH: 28.6 pg (ref 26.0–34.0)
MCHC: 31.7 g/dL (ref 30.0–36.0)
MCV: 90.2 fL (ref 78.0–100.0)
Platelets: 322 10*3/uL (ref 150–400)
RBC: 3.78 MIL/uL — ABNORMAL LOW (ref 4.22–5.81)
RDW: 14 % (ref 11.5–15.5)
WBC: 16.6 10*3/uL — ABNORMAL HIGH (ref 4.0–10.5)

## 2017-08-11 LAB — URINALYSIS, ROUTINE W REFLEX MICROSCOPIC
Bacteria, UA: NONE SEEN
Bilirubin Urine: NEGATIVE
Glucose, UA: NEGATIVE mg/dL
Ketones, ur: 5 mg/dL — AB
Leukocytes, UA: NEGATIVE
Nitrite: NEGATIVE
Protein, ur: NEGATIVE mg/dL
SPECIFIC GRAVITY, URINE: 1.036 — AB (ref 1.005–1.030)
Squamous Epithelial / LPF: NONE SEEN
pH: 5 (ref 5.0–8.0)

## 2017-08-11 LAB — ABO/RH: ABO/RH(D): O POS

## 2017-08-11 LAB — BODY FLUID CELL COUNT WITH DIFFERENTIAL
Eos, Fluid: 2 %
Lymphs, Fluid: 38 %
Monocyte-Macrophage-Serous Fluid: 18 % — ABNORMAL LOW (ref 50–90)
Neutrophil Count, Fluid: 42 % — ABNORMAL HIGH (ref 0–25)
WBC FLUID: 2168 uL — AB (ref 0–1000)

## 2017-08-11 LAB — BASIC METABOLIC PANEL
Anion gap: 12 (ref 5–15)
BUN: 23 mg/dL — ABNORMAL HIGH (ref 6–20)
CALCIUM: 9 mg/dL (ref 8.9–10.3)
CHLORIDE: 109 mmol/L (ref 101–111)
CO2: 18 mmol/L — ABNORMAL LOW (ref 22–32)
CREATININE: 1.24 mg/dL (ref 0.61–1.24)
GFR calc Af Amer: >60 mL/min (ref >60)
GFR calc non Af Amer: 53 mL/min — ABNORMAL LOW (ref >60)
Glucose, Bld: 94 mg/dL (ref 65–99)
Potassium: 4 mmol/L (ref 3.5–5.1)
Sodium: 139 mmol/L (ref 135–145)

## 2017-08-11 LAB — PROTEIN, PLEURAL OR PERITONEAL FLUID: Total protein, fluid: 3.9 g/dL

## 2017-08-11 LAB — TYPE AND SCREEN
ABO/RH(D): O POS
Antibody Screen: NEGATIVE

## 2017-08-11 MED ORDER — ALBUTEROL SULFATE (2.5 MG/3ML) 0.083% IN NEBU
2.5000 mg | INHALATION_SOLUTION | Freq: Four times a day (QID) | RESPIRATORY_TRACT | Status: DC | PRN
  Administered 2017-08-14: 2.5 mg via RESPIRATORY_TRACT
  Filled 2017-08-11: qty 3

## 2017-08-11 MED ORDER — IOPAMIDOL (ISOVUE-300) INJECTION 61%
INTRAVENOUS | Status: AC
  Filled 2017-08-11: qty 30

## 2017-08-11 MED ORDER — ATENOLOL 50 MG PO TABS
50.0000 mg | ORAL_TABLET | Freq: Every day | ORAL | Status: DC
  Administered 2017-08-11 – 2017-08-14 (×4): 50 mg via ORAL
  Filled 2017-08-11 (×4): qty 1

## 2017-08-11 MED ORDER — GADOBENATE DIMEGLUMINE 529 MG/ML IV SOLN
15.0000 mL | Freq: Once | INTRAVENOUS | Status: AC | PRN
  Administered 2017-08-11: 14 mL via INTRAVENOUS

## 2017-08-11 MED ORDER — LEVOTHYROXINE SODIUM 125 MCG PO TABS: 125.0000 ug | ORAL_TABLET | Freq: Every day | ORAL | Status: DC

## 2017-08-11 MED ORDER — PANTOPRAZOLE SODIUM 40 MG PO TBEC
40.0000 mg | DELAYED_RELEASE_TABLET | Freq: Every day | ORAL | Status: DC
  Administered 2017-08-11 – 2017-08-14 (×4): 40 mg via ORAL
  Filled 2017-08-11 (×4): qty 1

## 2017-08-11 MED ORDER — LEVOTHYROXINE SODIUM 25 MCG PO TABS
125.0000 ug | ORAL_TABLET | Freq: Every day | ORAL | Status: DC
  Administered 2017-08-11 – 2017-08-14 (×4): 125 ug via ORAL
  Filled 2017-08-11 (×4): qty 1

## 2017-08-11 MED ORDER — CITALOPRAM HYDROBROMIDE 20 MG PO TABS
20.0000 mg | ORAL_TABLET | Freq: Every day | ORAL | Status: DC
  Administered 2017-08-11 – 2017-08-14 (×4): 20 mg via ORAL
  Filled 2017-08-11 (×4): qty 1

## 2017-08-11 MED ORDER — IOPAMIDOL (ISOVUE-300) INJECTION 61%
30.0000 mL | Freq: Once | INTRAVENOUS | Status: AC | PRN
  Administered 2017-08-11: 30 mL via ORAL

## 2017-08-11 MED ORDER — ACETAMINOPHEN 325 MG PO TABS: 650.0000 mg | ORAL_TABLET | Freq: Four times a day (QID) | ORAL | Status: DC | PRN

## 2017-08-11 MED ORDER — OXYCODONE-ACETAMINOPHEN 5-325 MG PO TABS: 1.0000 | ORAL_TABLET | ORAL | Status: DC | PRN

## 2017-08-11 MED ORDER — ONDANSETRON HCL 4 MG/2ML IJ SOLN: 4.0000 mg | Freq: Four times a day (QID) | INTRAMUSCULAR | Status: DC | PRN

## 2017-08-11 MED ORDER — SODIUM CHLORIDE 0.9 % IV SOLN: Freq: Once | INTRAVENOUS | Status: DC

## 2017-08-11 MED ORDER — ONDANSETRON HCL 4 MG PO TABS: 4.0000 mg | ORAL_TABLET | Freq: Four times a day (QID) | ORAL | Status: DC | PRN

## 2017-08-11 MED ORDER — SODIUM CHLORIDE 0.9 % IV SOLN
INTRAVENOUS | Status: DC
  Administered 2017-08-11 (×2): via INTRAVENOUS

## 2017-08-11 MED ORDER — SIMVASTATIN 40 MG PO TABS
40.0000 mg | ORAL_TABLET | Freq: Every day | ORAL | Status: DC
  Administered 2017-08-11 – 2017-08-14 (×4): 40 mg via ORAL
  Filled 2017-08-11 (×4): qty 1

## 2017-08-11 MED ORDER — LIDOCAINE HCL 1 % IJ SOLN
INTRAMUSCULAR | Status: AC
  Filled 2017-08-11: qty 20

## 2017-08-11 MED ORDER — METOPROLOL SUCCINATE ER 50 MG PO TB24: 50.0000 mg | ORAL_TABLET | Freq: Every day | ORAL | Status: DC

## 2017-08-11 MED ORDER — HYDROCODONE-ACETAMINOPHEN 5-325 MG PO TABS
1.0000 | ORAL_TABLET | Freq: Four times a day (QID) | ORAL | Status: DC | PRN
  Administered 2017-08-11 – 2017-08-12 (×5): 2 via ORAL
  Filled 2017-08-11 (×5): qty 2

## 2017-08-11 MED ORDER — MORPHINE SULFATE (PF) 4 MG/ML IV SOLN
1.0000 mg | INTRAVENOUS | Status: DC | PRN
Start: 2017-08-11 — End: 2017-08-11

## 2017-08-11 MED ORDER — ACETAMINOPHEN 650 MG RE SUPP: 650.0000 mg | Freq: Four times a day (QID) | RECTAL | Status: DC | PRN

## 2017-08-11 NOTE — H&P (Addendum)
History and Physical    Peter Velasquez AOZ:[id number] DOB: 06-Oct-1933 DOA: 08/10/2017  Referring MD/NP/PA: Dr. Addison Lank PCP: [name], MD  Patient coming from: home  Chief Complaint:  Back pain  I have personally briefly reviewed patient's old medical records in [place]   HPI: Peter Velasquez is a 84 y.o. male with medical history significant of HTN, COPD, chronic back pain, depression, and hypothyroidism; who presents with complaints of back pain and falls over the last 3 weeks.  Patient reports having constant pain on the right-hand side of his back above his buttocks with radiation into the groin area.  Certain movement/position seem to worsen symptoms.  Despite use of pain medications of hydrocodone, but he reports no relief of symptoms.  Associated symptoms include shortness of breath, cold sweats, weight loss of 15 pounds in last week, black diarrhea like stools, and decreased appetite.  Denies having any significant cough, chest pain, abdominal pain, nausea, vomiting, or dysuria.   He has been under the care of Dr. Nelva Bush who had been treating him for spinal stenosis of the lumbar spine with spine injections.  However, patient had been found to recently have lung mass with pulmonary nodules and lytic bone lesions.  He had been set up with Dr. [name] of oncology and was in the process of undergoing evaluation set up to be evaluated by Dr. Malvin Johns on 4/9, and tohave a PET scan on 4/10.   ED Course: Upon admission to the emergency room patient was noted to have vital signs relatively within normal limits.  Labs revealed WBC 19.9, hemoglobin 11.2, BUN 28, and creatinine 1.55.  CT scan of the chest showed unchanged 4.3 x 2.4 cm spiculated mass of the right upper lobe with moderate right-sided pleural effusion and compressive atelectasis.   Review of Systems  Constitutional: Positive for malaise/fatigue and weight loss.  HENT: Negative for ear discharge.   Eyes: Negative for  photophobia and pain.  Respiratory: Positive for shortness of breath. Negative for cough and stridor.   Cardiovascular: Negative for chest pain and leg swelling.  Gastrointestinal: Positive for diarrhea and melena. Negative for nausea and vomiting.  Genitourinary: Negative for dysuria and hematuria.  Musculoskeletal: Positive for falls and myalgias.  Skin: Negative for itching.  Neurological: Negative for focal weakness and loss of consciousness.  Psychiatric/Behavioral: Negative for memory loss and suicidal ideas.     Past Medical History:  Diagnosis Date  . BENIGN PROSTATIC HYPERTROPHY 01/13/2007   Qualifier: Diagnosis of  By: Jenny Reichmann MD, Hunt Oris   . COPD 01/09/2007   Qualifier: Diagnosis of  By: Elveria Royals   . DEGENERATIVE JOINT DISEASE 01/09/2007   Qualifier: Diagnosis of  By: Elveria Royals   . DEPRESSION 01/09/2007   Qualifier: Diagnosis of  By: Elveria Royals   . DIVERTICULOSIS, COLON 01/09/2007   Qualifier: Diagnosis of  By: Elveria Royals   . ERECTILE DYSFUNCTION 01/09/2007   Qualifier: Diagnosis of  By: Elveria Royals   . GERD 01/09/2007   Qualifier: Diagnosis of  By: Elveria Royals   . HYPERLIPIDEMIA 01/09/2007   Qualifier: Diagnosis of  By: Elveria Royals   . HYPOTHYROIDISM 01/09/2007   Qualifier: Diagnosis of  By: Elveria Royals   . PALPITATIONS, HX OF 01/09/2007   Qualifier: Diagnosis of  By: Elveria Royals   . TINNITUS 01/09/2007   Qualifier: Diagnosis of  By: Elveria Royals     History reviewed. No pertinent  surgical history.   reports that he quit smoking about 19 years ago. He has never used smokeless tobacco. He reports that he drinks alcohol. He reports that he does not use drugs.  No Known Allergies  Family History  Problem Relation Age of Onset  . Stroke Father   . Hypertension Father   . Other Sister     Prior to Admission medications   Medication Sig Start Date End Date  Taking? Authorizing Provider  aspirin EC 81 MG tablet Take 81 mg by mouth at bedtime.    Yes [provider]  atenolol (TENORMIN) 50 MG tablet Take 50 mg by mouth daily.   Yes [provider]  benazepril (LOTENSIN) 20 MG tablet Take 20 mg by mouth daily.   Yes [provider]  citalopram (CELEXA) 20 MG tablet Take 20 mg by mouth daily.   Yes [provider]  hydrochlorothiazide (HYDRODIURIL) 25 MG tablet Take 25 mg by mouth daily. 07/15/11  Yes [provider]  HYDROcodone-acetaminophen (NORCO) 10-325 MG tablet Take 1 tablet by mouth 3 (three) times daily as needed for moderate pain or severe pain.  08/05/17  Yes [provider]  levothyroxine (SYNTHROID, LEVOTHROID) 125 MCG tablet Take 1 tablet by mouth daily. 07/05/17  Yes [provider]  metoprolol succinate (TOPROL-XL) 50 MG 24 hr tablet Take 50 mg by mouth daily.   Yes [provider]  omeprazole (PRILOSEC) 20 MG capsule Take 20 mg by mouth daily.   Yes [provider]  simvastatin (ZOCOR) 40 MG tablet Take 40 mg by mouth daily.   Yes [provider]    Physical Exam:  Constitutional: Elderly male in some moderate discomfort Vitals:   08/10/17 1920 08/10/17 1926 08/10/17 2100  BP: 121/75  126/69  Pulse: 75  77  Resp:   16  Temp: 98.9 F (37.2 C)    TempSrc: Oral    SpO2: 98%  95%  Weight:  69.4 kg (153 lb)   Height:  5\' 7"  (1.702 m)    Eyes: PERRL, lids and conjunctivae normal ENMT: Mucous membranes are dry. Posterior pharynx clear of any exudate or lesions.  Neck: normal, supple, no masses, no thyromegaly Respiratory: clear to auscultation bilaterally, no wheezing, no crackles. Normal respiratory effort. No accessory muscle use.  Cardiovascular: Regular rate and rhythm, no murmurs / rubs / gallops. No extremity edema. 2+ pedal pulses. No carotid bruits.  Tenderness noted of the left chest wall. Abdomen: no tenderness, no masses palpated. No  hepatosplenomegaly. Bowel sounds positive.  Musculoskeletal: no clubbing / cyanosis. No joint deformity upper and lower extremities. Good ROM, no contractures. Normal muscle tone.  Tenderness palpation noted of the lumbar spine.   Skin: no rashes, lesions, ulcers. No induration Neurologic: CN 2-12 grossly intact. Sensation intact, DTR normal. Strength 5/5 in all 4.  Psychiatric: Normal judgment and insight. Alert and oriented x 3. Normal mood.     Labs on Admission: I have personally reviewed following labs and imaging studies  CBC: Recent Labs  Lab 08/10/17 1319 08/10/17 2058  WBC 18.2* 19.9*  NEUTROABS 15.5* 16.7*  HGB  --  11.2*  HCT 37.2* 35.4*  MCV 88.9 90.8  PLT 338 740   Basic Metabolic Panel: Recent Labs  Lab 08/10/17 1319 08/10/17 2058  NA 138 136  K 4.1 4.1  CL 104 104  CO2 25 22  GLUCOSE 125 107*  BUN 27* 28*  CREATININE 1.67* 1.55*  CALCIUM 10.2 9.3   GFR: Estimated  Creatinine Clearance: 34.9 mL/min (A) (by C-G formula based on SCr of 1.55 mg/dL (H)). Liver Function Tests: Recent Labs  Lab 08/10/17 1319 08/10/17 2058  AST 18 18  ALT 11 13*  ALKPHOS 85 77  BILITOT 0.3 0.4  PROT 6.8 6.5  ALBUMIN 2.8* 3.0*   No results for input(s): LIPASE, AMYLASE in the last 168 hours. No results for input(s): AMMONIA in the last 168 hours. Coagulation Profile: No results for input(s): INR, PROTIME in the last 168 hours. Cardiac Enzymes: No results for input(s): CKTOTAL, CKMB, CKMBINDEX, TROPONINI in the last 168 hours. BNP (last 3 results) No results for input(s): PROBNP in the last 8760 hours. HbA1C: No results for input(s): HGBA1C in the last 72 hours. CBG: No results for input(s): GLUCAP in the last 168 hours. Lipid Profile: No results for input(s): CHOL, HDL, LDLCALC, TRIG, CHOLHDL, LDLDIRECT in the last 72 hours. Thyroid Function Tests: No results for input(s): TSH, T4TOTAL, FREET4, T3FREE, THYROIDAB in the last 72 hours. Anemia Panel: No results for  input(s): VITAMINB12, FOLATE, FERRITIN, TIBC, IRON, RETICCTPCT in the last 72 hours. Urine analysis: No results found for: COLORURINE, APPEARANCEUR, LABSPEC, PHURINE, GLUCOSEU, HGBUR, BILIRUBINUR, KETONESUR, PROTEINUR, UROBILINOGEN, NITRITE, LEUKOCYTESUR Sepsis Labs: No results found for this or any previous visit (from the past 240 hour(s)).   Radiological Exams on Admission: Ct Chest W Contrast  Result Date: 08/10/2017 CLINICAL DATA:  84 year old male with multiple falls over the past 3 weeks. Right upper lobe pulmonary mass seen on recent CT. Patient scheduled for PET-CT. EXAM: CT CHEST WITH CONTRAST TECHNIQUE: Multidetector CT imaging of the chest was performed during intravenous contrast administration. CONTRAST:  79mL ISOVUE-300 IOPAMIDOL (ISOVUE-300) INJECTION 61% COMPARISON:  07/30/2017 CT FINDINGS: Cardiovascular: Normal size heart without pericardial effusion. There is coronary arteriosclerosis involving the left main, lad and RCA. And aortic atherosclerosis. No aneurysm or dissection. Suboptimal opacification of the pulmonary arteries for assessment of pulmonary embolus. Mediastinum/Nodes: No thyromegaly or discrete thyroid mass. Unremarkable CT appearance of the esophagus. No axillary adenopathy. Enlarged 14 mm right paratracheal short axis lymph node, 14 mm right hilar lymph node and 11 mm subcarinal lymph node. No significant progression since recent comparison. No left hilar adenopathy. Lungs/Pleura: Redemonstration of small to moderate right-sided pleural effusion with adjacent compressive atelectasis. Spiculated masslike abnormality in the posterior segment of right upper lobe measuring 4.3 x 2.4 cm is redemonstrated and unchanged. Subpleural areas of interstitial fibrosis and mild peribronchial thickening is seen bilaterally. Upper Abdomen: Stable too small to further characterize right hepatic hypodensity measuring 5 mm. Water attenuating partially included left upper pole renal cyst  measuring 7.4 cm. Musculoskeletal: Osseous metastasis noted with slightly expansile lytic lesion in the posterior left second rib, lateral left seventh rib with pathologic fracture, right eighth rib, lytic and sclerotic L2 vertebral lesion and lytic posterior T9 vertebral lesion. Additionally there is a lytic abnormality involving the inferior right scapula without pathologic fracture. IMPRESSION: 1. Unchanged 4.3 x 2.4 cm spiculated mass abutting the major fissure in the posterior segment of right upper lobe. Associated small to moderate right-sided pleural effusion with adjacent compressive atelectasis. 2. Mediastinal and right hilar adenopathy is stable. 3. Osseous metastasis with lytic abnormalities noted of the right scapula, right eighth rib in addition to previously noted left-sided second and seventh rib fractures as well as lytic abnormalities involving the posterior T9 and L2 vertebral bodies. No pathologic fracture on the right. Aortic Atherosclerosis (ICD10-I70.0). Electronically Signed   By: Ashley Royalty M.D.   On: 08/10/2017  22:01   Dg Hip Unilat W Or Wo Pelvis 2-3 Views Right  Result Date: 08/10/2017 CLINICAL DATA:  Right hip pain after 2 falls today. EXAM: DG HIP (WITH OR WITHOUT PELVIS) 2-3V RIGHT COMPARISON:  None. FINDINGS: The cortical margins of the bony pelvis and right hip are intact. No evidence of fracture. Pubic symphysis and sacroiliac joints are congruent. Both femoral heads are well-seated in the respective acetabula. Mild degenerative change of both hips with acetabular spurring. Penile prosthesis is incidentally noted. IMPRESSION: No fracture of the pelvis or right hip. Electronically Signed   By: Jeb Levering M.D.   On: 08/10/2017 21:02     Assessment/Plan Metastatic disease with bone involvement, uncontrolled pain: Acute.  Patient presents with uncontrolled pain. Recently found to have right upper lobe spiculated lung mass with metastatic lesions to the bone on CT scan.     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 [name].Patient scheduled to see Dr. Lamonte Sakai for evaluation and consideration of bronchoscopy with Endo bronchial ultrasound and biopsy for tissue diagnosis tomorrow. - Dr. [name] added to treatment team - N.p.o. after midnight - Follow-up with Dr. Lamonte Sakai in a.m.  Normocytic normochromic anemia: Hemoglobin previously noted to be 12.1 and repeat check 11.2.  Question possibility of acute bleed possibly related with pleural effusion. - Type and screen for possible need of blood products - Recheck CBC in a.m.  Leukocytosis: Acute.  WBC elevated at 19.9 on admission.  Suspect related to above. - Recheck CBC in a.m.  Acute kidney injury : Patient's creatinine previously has been within normal limits but presents with a creatinine of 1.55 with BUN 28.  Elevated BUN to creatinine ratio suggest prerenal cause of symptoms. - Normal saline IV fluids at 100 mL/h - Check urinalysis  Essential hypertension: Patient reported being on atenolol and metoprolol. - Continue atenolol - Held benazepril and hydrochlorothiazide  Depression: Patient noted to have previous attempt of suicide in 2018, but reports no suicidal ideation. - Continue Celexa  Hypothyroidism - Continue levothyroxine   DVT prophylaxis:SCDs  Code Status: DNR Family Communication: No family present at bedside Disposition Plan: TBD Consults called: None Admission status: observation  Norval Morton MD [facility] Pager [phone]   If 7PM-7AM, please contact night-coverage [email or web address] Password [email or web address]  08/11/2017, 1:37 AM

## 2017-08-11 NOTE — Progress Notes (Signed)
Patient just came back from MRI. Patient is in severe pain. PCP was notified.

## 2017-08-11 NOTE — Telephone Encounter (Signed)
Scheduled appt per 4/8 los - spoke with wife , she is aware of appts date and time.

## 2017-08-11 NOTE — Consult Note (Signed)
PULMONARY / CRITICAL CARE MEDICINE   Name: Peter Velasquez MRN: [id number] DOB: 25-Jun-1933    ADMISSION DATE:  08/10/2017 CONSULTATION DATE:  08/11/2017  REFERRING MD:  Aileen Fass  CHIEF COMPLAINT:  Dyspnea, lung mass  HISTORY OF PRESENT ILLNESS:   This is a pleasant 84 year old male who smokes cigarettes at a rate of approximately 3/4 pack cigarettes daily from age 45 until age 24 who comes to our facility with diffuse pain throughout his entire body and a lung mass.  He says that he fell several weeks ago and had some left-sided chest pain so he came into his primary care physician who ordered a chest x-ray to assess for rib fractures.  He was found to have a right upper lobe mass.  A CT scan was performed and he was referred to our clinic.  However, he was supposed to be seen in clinic today but yesterday he had severe pain throughout his entire body which made him come to the hospital for further care.  He says that he has had progressive chest pain whenever he takes of breath over the last several weeks.  This is been slowly progressive.  He is also had slowly progressive shortness of breath during this time.  He does not have much of a cough.  No hemoptysis.  He is not smoking.  He was found to have a new right-sided pleural effusion.  Pulmonary and critical care medicine was consulted for further evaluation and consideration of bronchoscopy.  PAST MEDICAL HISTORY :  He  has a past medical history of BENIGN PROSTATIC HYPERTROPHY (01/13/2007), COPD (01/09/2007), DEGENERATIVE JOINT DISEASE (01/09/2007), DEPRESSION (01/09/2007), DIVERTICULOSIS, COLON (01/09/2007), ERECTILE DYSFUNCTION (01/09/2007), GERD (01/09/2007), HYPERLIPIDEMIA (01/09/2007), HYPOTHYROIDISM (01/09/2007), PALPITATIONS, HX OF (01/09/2007), and TINNITUS (01/09/2007).  PAST SURGICAL HISTORY: He  has no past surgical history on file.  No Known Allergies  No current facility-administered medications on file prior to encounter.    Current Outpatient  Medications on File Prior to Encounter  Medication Sig  . aspirin EC 81 MG tablet Take 81 mg by mouth at bedtime.   Marland Kitchen atenolol (TENORMIN) 50 MG tablet Take 50 mg by mouth daily.  . benazepril (LOTENSIN) 20 MG tablet Take 20 mg by mouth daily.  . citalopram (CELEXA) 20 MG tablet Take 20 mg by mouth daily.  . hydrochlorothiazide (HYDRODIURIL) 25 MG tablet Take 25 mg by mouth daily.  Marland Kitchen HYDROcodone-acetaminophen (NORCO) 10-325 MG tablet Take 1 tablet by mouth 3 (three) times daily as needed for moderate pain or severe pain.   Marland Kitchen levothyroxine (SYNTHROID, LEVOTHROID) 125 MCG tablet Take 1 tablet by mouth daily.  . metoprolol succinate (TOPROL-XL) 50 MG 24 hr tablet Take 50 mg by mouth daily.  Marland Kitchen omeprazole (PRILOSEC) 20 MG capsule Take 20 mg by mouth daily.  . simvastatin (ZOCOR) 40 MG tablet Take 40 mg by mouth daily.    FAMILY HISTORY:  His indicated that the status of his father is unknown. He indicated that the status of his sister is unknown.   SOCIAL HISTORY: He  reports that he quit smoking about 19 years ago. He has never used smokeless tobacco. He reports that he drinks alcohol. He reports that he does not use drugs.  REVIEW OF SYSTEMS:   Gen: Denies fever, chills, weight change, fatigue, night sweats HEENT: Denies blurred vision, double vision, hearing loss, tinnitus, sinus congestion, rhinorrhea, sore throat, neck stiffness, dysphagia PULM: per HPI CV: Denies chest pain, edema, orthopnea, paroxysmal nocturnal dyspnea, palpitations GI: Denies abdominal  pain, nausea, vomiting, diarrhea, hematochezia, melena, constipation, change in bowel habits GU: Denies dysuria, hematuria, polyuria, oliguria, urethral discharge Endocrine: Denies hot or cold intolerance, polyuria, polyphagia or appetite change Derm: Denies rash, dry skin, scaling or peeling skin change Heme: Denies easy bruising, bleeding, bleeding gums Neuro: Denies headache, numbness, weakness, slurred speech, loss of memory or  consciousness   SUBJECTIVE:  As above  VITAL SIGNS: BP 140/60 (BP Location: Left Arm)   Pulse 76   Temp 98.8 F (37.1 C) (Oral)   Resp 16   Ht 5\' 7"  (1.702 m)   Wt 69.4 kg (153 lb)   SpO2 94%   BMI 23.96 kg/m   HEMODYNAMICS:    VENTILATOR SETTINGS:    INTAKE / OUTPUT: I/O last 3 completed shifts: In: 1320 [I.V.:320; IV Piggyback:1000] Out: 125 [Urine:125]  PHYSICAL EXAMINATION:  General:  Chronically ill appearing, resting comfortably in bed HENT: NCAT OP clear PULM: Diminished R base, clear otherwise, normal effort CV: RRR, no mgr GI: BS+, soft, nontender MSK: normal bulk and tone Neuro: awake, alert, no distress, MAEW   LABS:  BMET Recent Labs  Lab 08/10/17 1319 08/10/17 2058 08/11/17 0546  NA 138 136 139  K 4.1 4.1 4.0  CL 104 104 109  CO2 25 22 18*  BUN 27* 28* 23*  CREATININE 1.67* 1.55* 1.24  GLUCOSE 125 107* 94    Electrolytes Recent Labs  Lab 08/10/17 1319 08/10/17 2058 08/11/17 0546  CALCIUM 10.2 9.3 9.0    CBC Recent Labs  Lab 08/10/17 1319 08/10/17 2058 08/11/17 0546  WBC 18.2* 19.9* 16.6*  HGB  --  11.2* 10.8*  HCT 37.2* 35.4* 34.1*  PLT 338 345 322    Coag's No results for input(s): APTT, INR in the last 168 hours.  Sepsis Markers No results for input(s): LATICACIDVEN, PROCALCITON, O2SATVEN in the last 168 hours.  ABG No results for input(s): PHART, PCO2ART, PO2ART in the last 168 hours.  Liver Enzymes Recent Labs  Lab 08/10/17 1319 08/10/17 2058  AST 18 18  ALT 11 13*  ALKPHOS 85 77  BILITOT 0.3 0.4  ALBUMIN 2.8* 3.0*    Cardiac Enzymes No results for input(s): TROPONINI, PROBNP in the last 168 hours.  Glucose No results for input(s): GLUCAP in the last 168 hours.  Imaging Ct Chest W Contrast  Result Date: 08/10/2017 CLINICAL DATA:  84 year old male with multiple falls over the past 3 weeks. Right upper lobe pulmonary mass seen on recent CT. Patient scheduled for PET-CT. EXAM: CT CHEST WITH  CONTRAST TECHNIQUE: Multidetector CT imaging of the chest was performed during intravenous contrast administration. CONTRAST:  41mL ISOVUE-300 IOPAMIDOL (ISOVUE-300) INJECTION 61% COMPARISON:  07/30/2017 CT FINDINGS: Cardiovascular: Normal size heart without pericardial effusion. There is coronary arteriosclerosis involving the left main, lad and RCA. And aortic atherosclerosis. No aneurysm or dissection. Suboptimal opacification of the pulmonary arteries for assessment of pulmonary embolus. Mediastinum/Nodes: No thyromegaly or discrete thyroid mass. Unremarkable CT appearance of the esophagus. No axillary adenopathy. Enlarged 14 mm right paratracheal short axis lymph node, 14 mm right hilar lymph node and 11 mm subcarinal lymph node. No significant progression since recent comparison. No left hilar adenopathy. Lungs/Pleura: Redemonstration of small to moderate right-sided pleural effusion with adjacent compressive atelectasis. Spiculated masslike abnormality in the posterior segment of right upper lobe measuring 4.3 x 2.4 cm is redemonstrated and unchanged. Subpleural areas of interstitial fibrosis and mild peribronchial thickening is seen bilaterally. Upper Abdomen: Stable too small to further characterize right hepatic hypodensity  measuring 5 mm. Water attenuating partially included left upper pole renal cyst measuring 7.4 cm. Musculoskeletal: Osseous metastasis noted with slightly expansile lytic lesion in the posterior left second rib, lateral left seventh rib with pathologic fracture, right eighth rib, lytic and sclerotic L2 vertebral lesion and lytic posterior T9 vertebral lesion. Additionally there is a lytic abnormality involving the inferior right scapula without pathologic fracture. IMPRESSION: 1. Unchanged 4.3 x 2.4 cm spiculated mass abutting the major fissure in the posterior segment of right upper lobe. Associated small to moderate right-sided pleural effusion with adjacent compressive atelectasis. 2.  Mediastinal and right hilar adenopathy is stable. 3. Osseous metastasis with lytic abnormalities noted of the right scapula, right eighth rib in addition to previously noted left-sided second and seventh rib fractures as well as lytic abnormalities involving the posterior T9 and L2 vertebral bodies. No pathologic fracture on the right. Aortic Atherosclerosis (ICD10-I70.0). Electronically Signed   By: Ashley Royalty M.D.   On: 08/10/2017 22:01   Dg Hip Unilat W Or Wo Pelvis 2-3 Views Right  Result Date: 08/10/2017 CLINICAL DATA:  Right hip pain after 2 falls today. EXAM: DG HIP (WITH OR WITHOUT PELVIS) 2-3V RIGHT COMPARISON:  None. FINDINGS: The cortical margins of the bony pelvis and right hip are intact. No evidence of fracture. Pubic symphysis and sacroiliac joints are congruent. Both femoral heads are well-seated in the respective acetabula. Mild degenerative change of both hips with acetabular spurring. Penile prosthesis is incidentally noted. IMPRESSION: No fracture of the pelvis or right hip. Electronically Signed   By: Jeb Levering M.D.   On: 08/10/2017 21:02     STUDIES:  CT chest August 10, 2017 showed a 4.3 x 2.4 spiculated mass in the posterior segment of the right upper lobe, associated small moderate right-sided pleural effusion with adjacent compressive atelectasis.  Mediastinal and right hilar adenopathy.  Osseous metastasis with lytic abnormalities of the right scapula.  CULTURES:   ANTIBIOTICS:   SIGNIFICANT EVENTS:   LINES/TUBES:   DISCUSSION: This is a pleasant 84 year old male with a lengthy smoking history who came to our facility with complaints of diffuse body pain in the setting of what appears to be metastatic lung cancer.  He has a right-sided pleural effusion, large right sided mass, mediastinal and hilar adenopathy.  He also has a metastatic lesion in his right scapula.  Based on his diffuse pain I am concerned that he may have metastatic lesions throughout his  body.  ASSESSMENT / PLAN:  A: Right-sided pleural effusion Right upper lobe lung mass with mediastinal lymphadenopathy and what appears to be metastatic lesions, likely bronchogenic carcinoma Former smoker Hip and abdominal pain Mild confusion P:   I have made efforts to get a bronchoscopy arranged but unfortunately the bronchoscopy staff and endoscopy staff schedules are not lining up for [place] to be able to help this patient right now. I think the first step should be to perform a thoracentesis and send the fluid for cytology We should also check a CT of his abdomen and pelvis, ideally with contrast if his creatinine will allow it to look for evidence of metastatic disease explaining his hip and abdominal pain Depending on the results of the CT abdomen pelvis we will see if there would be a lesion amenable to biopsy I will continue to make efforts to try to find a time when we can perform a bronchoscopy this week Continue IV pain medicines as you are doing Agree with plans for MRI brain  Roselie Awkward, MD [facility] Pager: [phone] Cell: [phone] After 3pm or if no response, call [phone]  08/11/2017, 12:48 PM

## 2017-08-11 NOTE — Progress Notes (Signed)
[facility] PROGRESS NOTE    Progress Note  Peter Velasquez  LZJ:[id number] DOB: 1934-03-22 DOA: 08/10/2017 PCP: [name], MD     Brief Narrative:   Peter Velasquez is an 84 y.o. male with PMH of HTN, COPD, depression  Comes in with back pain and fall.  He reports constant pain on the right side of his back and buttock radiating to the groin area.  He has no relief despite narcotics.  He also has a 15 pound weight loss in the last week with cold sweats and shortness of breath, he was found to have a lung mass with a pulmonary nodule and lytic lesions, was scheduled to follow-up with Dr. [name]  Assessment/Plan:   Metastasis disease with bone involvement: Started on narcotics for pain control, he was found to have a recent right upper lobe spiculated lung mass with metastatic lesions. Continue narcotics for pain. We have consulted pulmonary and critical care for probable endobronchial biopsy.  Malignant pleural effusion We will consult pulmonary and critical care for thoracocentesis, will send for cytology. Consult IR for ultrasound-guided thoracocentesis sent for cytology in light's criteria  Hypothyroidism Cont synthroid  AKI (acute kidney injury) ([place]) Likely prerenal in etiology started on IV fluids and creatinine slowly improving we will condition IV fluids for an additional 24 hours.  Essential hypertension: Continue beta-blockers, continue to hold ACE inhibitor and diuretics.  Next  Depression: Continue Celexa.  Normocytic normochromic anemia Mild drop in hemoglobin from 11.2-10.8, will continue to monitor no signs of overt bleeding.    DVT prophylaxis: SCD Family Communication:none Disposition Plan/Barrier to D/C: Once breathing improved and after endobronchial biopsy. Code Status:     Code Status Orders  (From admission, onward)        Start     Ordered   08/11/17 0100  Do not attempt resuscitation (DNR)  Continuous    Question Answer Comment    In the event of cardiac or respiratory ARREST Do not call a "code blue"   In the event of cardiac or respiratory ARREST Do not perform Intubation, CPR, defibrillation or ACLS   In the event of cardiac or respiratory ARREST Use medication by any route, position, wound care, and other measures to relive pain and suffering. May use oxygen, suction and manual treatment of airway obstruction as needed for comfort.      08/11/17 0100    Code Status History    Date Active Date Inactive Code Status Order ID Comments User Context   04/29/2017 1517 05/01/2017 1907 DNR [id number]  Benjamine Mola, FNP Inpatient   04/23/2017 1608 04/29/2017 1505 DNR [id number]  Domenic Polite, MD Inpatient        IV Access:    Peripheral IV   Procedures and diagnostic studies:   Ct Chest W Contrast  Result Date: 08/10/2017 CLINICAL DATA:  84 year old male with multiple falls over the past 3 weeks. Right upper lobe pulmonary mass seen on recent CT. Patient scheduled for PET-CT. EXAM: CT CHEST WITH CONTRAST TECHNIQUE: Multidetector CT imaging of the chest was performed during intravenous contrast administration. CONTRAST:  54mL ISOVUE-300 IOPAMIDOL (ISOVUE-300) INJECTION 61% COMPARISON:  07/30/2017 CT FINDINGS: Cardiovascular: Normal size heart without pericardial effusion. There is coronary arteriosclerosis involving the left main, lad and RCA. And aortic atherosclerosis. No aneurysm or dissection. Suboptimal opacification of the pulmonary arteries for assessment of pulmonary embolus. Mediastinum/Nodes: No thyromegaly or discrete thyroid mass. Unremarkable CT appearance of the esophagus. No axillary adenopathy. Enlarged 14 mm right paratracheal  short axis lymph node, 14 mm right hilar lymph node and 11 mm subcarinal lymph node. No significant progression since recent comparison. No left hilar adenopathy. Lungs/Pleura: Redemonstration of small to moderate right-sided pleural effusion with adjacent compressive atelectasis.  Spiculated masslike abnormality in the posterior segment of right upper lobe measuring 4.3 x 2.4 cm is redemonstrated and unchanged. Subpleural areas of interstitial fibrosis and mild peribronchial thickening is seen bilaterally. Upper Abdomen: Stable too small to further characterize right hepatic hypodensity measuring 5 mm. Water attenuating partially included left upper pole renal cyst measuring 7.4 cm. Musculoskeletal: Osseous metastasis noted with slightly expansile lytic lesion in the posterior left second rib, lateral left seventh rib with pathologic fracture, right eighth rib, lytic and sclerotic L2 vertebral lesion and lytic posterior T9 vertebral lesion. Additionally there is a lytic abnormality involving the inferior right scapula without pathologic fracture. IMPRESSION: 1. Unchanged 4.3 x 2.4 cm spiculated mass abutting the major fissure in the posterior segment of right upper lobe. Associated small to moderate right-sided pleural effusion with adjacent compressive atelectasis. 2. Mediastinal and right hilar adenopathy is stable. 3. Osseous metastasis with lytic abnormalities noted of the right scapula, right eighth rib in addition to previously noted left-sided second and seventh rib fractures as well as lytic abnormalities involving the posterior T9 and L2 vertebral bodies. No pathologic fracture on the right. Aortic Atherosclerosis (ICD10-I70.0). Electronically Signed   By: Ashley Royalty M.D.   On: 08/10/2017 22:01   Dg Hip Unilat W Or Wo Pelvis 2-3 Views Right  Result Date: 08/10/2017 CLINICAL DATA:  Right hip pain after 2 falls today. EXAM: DG HIP (WITH OR WITHOUT PELVIS) 2-3V RIGHT COMPARISON:  None. FINDINGS: The cortical margins of the bony pelvis and right hip are intact. No evidence of fracture. Pubic symphysis and sacroiliac joints are congruent. Both femoral heads are well-seated in the respective acetabula. Mild degenerative change of both hips with acetabular spurring. Penile prosthesis is  incidentally noted. IMPRESSION: No fracture of the pelvis or right hip. Electronically Signed   By: Jeb Levering M.D.   On: 08/10/2017 21:02     Medical Consultants:    None.  Anti-Infectives:   None  Subjective:    Peter Velasquez relates his breathing is not improved continues to have cough.  Objective:    Vitals:   08/10/17 1926 08/10/17 2100 08/11/17 0252 08/11/17 0450  BP:  126/69 (!) 155/68 140/60  Pulse:  77 84 76  Resp:  16    Temp:   (!) 100.6 F (38.1 C) 98.8 F (37.1 C)  TempSrc:   Oral Oral  SpO2:  95% 95% 94%  Weight: 69.4 kg (153 lb)     Height: 5\' 7"  (1.702 m)       Intake/Output Summary (Last 24 hours) at 08/11/2017 0904 Last data filed at 08/11/2017 0600 Gross per 24 hour  Intake 1320 ml  Output 125 ml  Net 1195 ml   Filed Weights   08/10/17 1926  Weight: 69.4 kg (153 lb)    Exam: General exam: In no acute distress. Respiratory system: Good air movement and clear to auscultation. Cardiovascular system: S1 & S2 heard, RRR. Gastrointestinal system: Abdomen is nondistended, soft and nontender.  Central nervous system: Alert and oriented. No focal neurological deficits. Extremities: No pedal edema. Skin: No rashes, lesions or ulcers    Data Reviewed:    Labs: Basic Metabolic Panel: Recent Labs  Lab 08/10/17 1319 08/10/17 2058 08/11/17 0546  NA 138 136 139  K 4.1 4.1 4.0  CL 104 104 109  CO2 25 22 18*  GLUCOSE 125 107* 94  BUN 27* 28* 23*  CREATININE 1.67* 1.55* 1.24  CALCIUM 10.2 9.3 9.0   GFR Estimated Creatinine Clearance: 43.7 mL/min (by C-G formula based on SCr of 1.24 mg/dL). Liver Function Tests: Recent Labs  Lab 08/10/17 1319 08/10/17 2058  AST 18 18  ALT 11 13*  ALKPHOS 85 77  BILITOT 0.3 0.4  PROT 6.8 6.5  ALBUMIN 2.8* 3.0*   No results for input(s): LIPASE, AMYLASE in the last 168 hours. No results for input(s): AMMONIA in the last 168 hours. Coagulation profile No results for input(s): INR, PROTIME in  the last 168 hours.  CBC: Recent Labs  Lab 08/10/17 1319 08/10/17 2058 08/11/17 0546  WBC 18.2* 19.9* 16.6*  NEUTROABS 15.5* 16.7*  --   HGB  --  11.2* 10.8*  HCT 37.2* 35.4* 34.1*  MCV 88.9 90.8 90.2  PLT 338 345 322   Cardiac Enzymes: No results for input(s): CKTOTAL, CKMB, CKMBINDEX, TROPONINI in the last 168 hours. BNP (last 3 results) No results for input(s): PROBNP in the last 8760 hours. CBG: No results for input(s): GLUCAP in the last 168 hours. D-Dimer: No results for input(s): DDIMER in the last 72 hours. Hgb A1c: No results for input(s): HGBA1C in the last 72 hours. Lipid Profile: No results for input(s): CHOL, HDL, LDLCALC, TRIG, CHOLHDL, LDLDIRECT in the last 72 hours. Thyroid function studies: No results for input(s): TSH, T4TOTAL, T3FREE, THYROIDAB in the last 72 hours.  Invalid input(s): FREET3 Anemia work up: No results for input(s): VITAMINB12, FOLATE, FERRITIN, TIBC, IRON, RETICCTPCT in the last 72 hours. Sepsis Labs: Recent Labs  Lab 08/10/17 1319 08/10/17 2058 08/11/17 0546  WBC 18.2* 19.9* 16.6*   Microbiology No results found for this or any previous visit (from the past 240 hour(s)).   Medications:   . atenolol  50 mg Oral Daily  . citalopram  20 mg Oral Daily  . levothyroxine  125 mcg Oral QAC breakfast  . pantoprazole  40 mg Oral Daily  . simvastatin  40 mg Oral Daily   Continuous Infusions: . sodium chloride 100 mL/hr at 08/11/17 0248  . sodium chloride        LOS: 0 days   Charlynne Cousins  [facility] Pager [phone]  *Please refer to [email or web address], password [email or web address] to get updated schedule on who will round on this patient, as hospitalists switch teams weekly. If 7PM-7AM, please contact night-coverage at [email or web address], password [email or web address] for any overnight needs.  08/11/2017, 9:04 AM

## 2017-08-11 NOTE — Care Management Obs Status (Signed)
[facility] NOTIFICATION   Patient Details  Name: Peter Velasquez MRN: [id number] Date of Birth: 1933/12/07   Medicare Observation Status Notification Given:  Yes    MahabirJuliann Pulse, RN 08/11/2017, 2:50 PM

## 2017-08-11 NOTE — Procedures (Signed)
Ultrasound-guided diagnostic and therapeutic right thoracentesis performed yielding 1.1 liters of slightly hazy, yellow fluid. No immediate complications. Follow-up chest x-ray pending.The fluid was sent to the lab for preordered studies.

## 2017-08-12 ENCOUNTER — Encounter (HOSPITAL_COMMUNITY): Payer: Medicare Other

## 2017-08-12 DIAGNOSIS — J90 Pleural effusion, not elsewhere classified: Secondary | ICD-10-CM

## 2017-08-12 DIAGNOSIS — J910 Malignant pleural effusion: Secondary | ICD-10-CM

## 2017-08-12 DIAGNOSIS — E43 Unspecified severe protein-calorie malnutrition: Secondary | ICD-10-CM

## 2017-08-12 DIAGNOSIS — R918 Other nonspecific abnormal finding of lung field: Secondary | ICD-10-CM

## 2017-08-12 MED ORDER — ADULT MULTIVITAMIN W/MINERALS CH
1.0000 | ORAL_TABLET | Freq: Every day | ORAL | Status: DC
  Administered 2017-08-12 – 2017-08-14 (×3): 1 via ORAL
  Filled 2017-08-12 (×3): qty 1

## 2017-08-12 MED ORDER — HYDROCODONE-ACETAMINOPHEN 5-325 MG PO TABS: 2.0000 | ORAL_TABLET | ORAL | Status: DC | PRN

## 2017-08-12 MED ORDER — MORPHINE SULFATE (PF) 4 MG/ML IV SOLN
2.0000 mg | INTRAVENOUS | Status: DC | PRN
  Administered 2017-08-13: 2 mg via INTRAVENOUS
  Filled 2017-08-12: qty 1

## 2017-08-12 MED ORDER — OXYCODONE-ACETAMINOPHEN 5-325 MG PO TABS
2.0000 | ORAL_TABLET | ORAL | Status: DC | PRN
  Administered 2017-08-12 – 2017-08-14 (×5): 2 via ORAL
  Filled 2017-08-12 (×5): qty 2

## 2017-08-12 MED ORDER — ENSURE ENLIVE PO LIQD
237.0000 mL | Freq: Two times a day (BID) | ORAL | Status: DC
  Administered 2017-08-12 – 2017-08-14 (×4): 237 mL via ORAL

## 2017-08-12 NOTE — Progress Notes (Signed)
Initial Nutrition Assessment  DOCUMENTATION CODES:   Severe malnutrition in context of acute illness/injury  INTERVENTION:  - Will order Ensure Enlive po BID, each supplement provides 350 kcal and 20 grams of protein - Will order daily multivitamin with minerals. - Family to bring in foods pt likes (current diet order reviewed with them). - Continue to encourage PO intakes.   NUTRITION DIAGNOSIS:   Severe Malnutrition related to acute illness, catabolic illness(lung mass) as evidenced by moderate fat depletion, moderate muscle depletion.  GOAL:   Patient will meet greater than or equal to 90% of their needs  MONITOR:   PO intake, Supplement acceptance, Weight trends, Labs  REASON FOR ASSESSMENT:   Malnutrition Screening Tool  ASSESSMENT:   84 y.o. male with PMH of HTN, COPD, depression, and spinal stenosis. He came to the ED with back pain and s/p a fall. He reports constant pain on the right side of his back and buttock radiating to the groin area. He has no relief despite narcotics. He also reported a 15 pound weight loss in the last week with cold sweats and shortness of breath. He was found to have a lung mass with a pulmonary nodule and lytic lesions and was scheduled for a PET scan on 4/9 but arrived to the ED on 4/8 and required admission.  BMI indicates normal weight. No intakes documented since admission. Diet advanced from NPO to Heart Healthy today at 10:45 AM and pt had lunch tray at the time of RD visit. He ate the fresh fruit cup and took 1 bite of grilled cheese. Pt does not like the taste of hospital's food and daughter and wife, who are at bedside, ask about bringing food in for pt. Reviewed current diet order with them and they plan to bring in unsalted nuts, tuna salad, egg salad, and other items pt likes that are low in salt. Pt reports that he has had taste alteration ("different, not like it used to" taste to food) for the past ~1.5 weeks. He reports decreased  appetite for at least the past 1 week but states it could have been longer and taste change may be a factor of decrease in appetite.  Pt denies any chewing or swallowing issues with any items. He denies abdominal pain or nausea now or in the time PTA. His wife had purchased Ensure last week and reports that pt drank 5 bottles in 5 days (one per day) and pt and family are interested in pt receiving this supplement during hospitalization. Pt reports feeling weaker than usual for the past at least 5-6 weeks.   Pt states he is unsure of UBW but that his weight had been stable for several years. He then had a PCP appointment about a week and a half ago at which time he weighed 168 lbs and he knew that he had lost weight within the 6 months prior to that time. Limited weight hx available but outlined below. Per reported weight 1.5 weeks ago and current weight, pt has lost 15 lbs (9% body weight) which is significant for time frame.   Wt Readings from Last 10 Encounters:  08/10/17 153 lb (69.4 kg)  08/10/17 153 lb 14.4 oz (69.8 kg)  04/23/17 160 lb (72.6 kg)  03/09/13 166 lb (75.3 kg)  10/23/05 175 lb (79.4 kg)    Medications reviewed; 125 mcg oral Synthroid/day. Labs reviewed; BUN: 23 mg/dL.     NUTRITION - FOCUSED PHYSICAL EXAM:    Most Recent Value  Orbital Region  Mild depletion  Upper Arm Region  Moderate depletion  Thoracic and Lumbar Region  Unable to assess  Buccal Region  Mild depletion  Temple Region  Mild depletion  Clavicle Bone Region  Mild depletion  Clavicle and Acromion Bone Region  Moderate depletion  Scapular Bone Region  Unable to assess  Dorsal Hand  No depletion  Patellar Region  Mild depletion  Anterior Thigh Region  Moderate depletion  Posterior Calf Region  Moderate depletion  Edema (RD Assessment)  None  Hair  Reviewed  Eyes  Reviewed  Mouth  Reviewed  Skin  Reviewed  Nails  Reviewed       Diet Order:  Diet Heart Room service appropriate? Yes; Fluid  consistency: Thin Diet NPO time specified  EDUCATION NEEDS:   No education needs have been identified at this time  Skin:  Skin Assessment: Reviewed RN Assessment  Last BM:  PTA/unknown  Height:   Ht Readings from Last 1 Encounters:  08/10/17 5\' 7"  (1.702 m)    Weight:   Wt Readings from Last 1 Encounters:  08/10/17 153 lb (69.4 kg)    Ideal Body Weight:  67.27 kg  BMI:  Body mass index is 23.96 kg/m.  Estimated Nutritional Needs:   Kcal:  2080-2290 (30-33 kcal/kg)  Protein:  97-110 grams (1.4-1.6 grams/kg)  Fluid:  >/= 2 L/day      Jarome Matin, MS, RD, LDN, [facility] Clinical Dietitian Pager # [phone] After hours/weekend pager # [phone]

## 2017-08-12 NOTE — Progress Notes (Signed)
PT Cancellation Note  Patient Details Name: Peter Velasquez MRN: [id number] DOB: Mar 19, 1934   Cancelled Treatment:    Reason Eval/Treat Not Completed: Pain limiting ability to participate, per RN, just received pain medication and patient reports inability to tolerate mobility. Will check back another time.    Claretha Cooper 08/12/2017, 11:16 AM Tresa Endo PT [phone]

## 2017-08-12 NOTE — Progress Notes (Signed)
[facility] PROGRESS NOTE    Progress Note  Peter Velasquez  IRW:[id number] DOB: 1933-11-27 DOA: 08/10/2017 PCP: [name], MD     Brief Narrative:   Peter Velasquez is an 84 y.o. male with PMH of HTN, COPD, depression  Comes in with back pain and fall.  He reports constant pain on the right side of his back and buttock radiating to the groin area.  He has no relief despite narcotics.  He also has a 15 pound weight loss in the last week with cold sweats and shortness of breath, he was found to have a lung mass with a pulmonary nodule and lytic lesions, was scheduled to follow-up with Dr. [name]  Assessment/Plan:   Metastasis disease with bone involvement: Pain not control, start oxy prn. CT abd and pelvis : Diffuse metastatic disease T11 through L5 with no retropulsion, pathologic fracture involving the inferior endplate of L4 MRI of the brain showed no acute findings. pulmonary has been consulted and he is tentatively scheduled for endobronchial biopsy on 08/13/2017. We will consult oncology for possible radiation to the back. PT consulted.  Malignant pleural effusion Status post thoracocentesis by IR on 08/11/2017 in the ER 1 L of fluid cytology pending  Hypothyroidism Cont synthroid  AKI (acute kidney injury) ([place]) Likely prerenal in etiology resolved with IV fluid KVO normal saline.  Essential hypertension: Continue beta-blockers, continue to hold ACE inhibitor and diuretics.    Depression: Continue Celexa.  Normocytic normochromic anemia Mild drop in hemoglobin from 11.2-10.8, will continue to monitor no signs of overt bleeding.    DVT prophylaxis: SCD Family Communication:none Disposition Plan/Barrier to D/C: Once breathing improved and after endobronchial biopsy. Code Status:     Code Status Orders  (From admission, onward)        Start     Ordered   08/11/17 0100  Do not attempt resuscitation (DNR)  Continuous    Question Answer Comment  In the  event of cardiac or respiratory ARREST Do not call a "code blue"   In the event of cardiac or respiratory ARREST Do not perform Intubation, CPR, defibrillation or ACLS   In the event of cardiac or respiratory ARREST Use medication by any route, position, wound care, and other measures to relive pain and suffering. May use oxygen, suction and manual treatment of airway obstruction as needed for comfort.      08/11/17 0100    Code Status History    Date Active Date Inactive Code Status Order ID Comments User Context   04/29/2017 1517 05/01/2017 1907 DNR [id number]  Benjamine Mola, FNP Inpatient   04/23/2017 1608 04/29/2017 1505 DNR [id number]  Domenic Polite, MD Inpatient        IV Access:    Peripheral IV   Procedures and diagnostic studies:   Ct Abdomen Pelvis Wo Contrast  Result Date: 08/11/2017 CLINICAL DATA:  Right-sided back pain after fall radiating to the groin. 15 pound weight loss in the past week. Patient found to have a lung mass with pulmonary nodule lytic lesions. EXAM: CT ABDOMEN AND PELVIS WITHOUT CONTRAST TECHNIQUE: Multidetector CT imaging of the abdomen and pelvis was performed following the standard protocol without IV contrast. COMPARISON:  Report from May 06, 2000. FINDINGS: Lower chest: Top-normal size heart without pericardial effusion or thickening. Bilateral pleural calcifications consistent with prior asbestos exposure with trace right-sided pleural effusion and bibasilar atelectasis. Hepatobiliary: Unenhanced liver is unremarkable. Gallbladder is physiologically distended faint densities along the dependent aspect suspicious for  tiny gallstones. No biliary dilatation. Pancreas: No ductal dilatation, mass or peripancreatic inflammation. Spleen: Normal size spleen without mass. Adrenals/Urinary Tract: Normal bilateral adrenal glands. Dominant cyst arising from the upper pole of the left kidney measuring 7.9 x 6.8 x 6.1 cm, anechoic in appearance and consistent with  a simple cyst. Smaller interpolar 2 cm cyst as an exophytic 1.6 cm cyst are noted of the left kidney. Subtle hyperdensities within the renal collecting systems and pelves compatible with previous administration of IV contrast. No hydroureteronephrosis. Contrast noted within the urinary bladder without focal mural thickening or calculus. Stomach/Bowel: Enteric contrast is seen within the stomach. Normal small bowel rotation without obstruction. Contrast reaches the cecum and extends to the transverse colon. No acute bowel inflammation or obstruction. Normal appendix. Vascular/Lymphatic: Moderate aortoiliac atherosclerosis without aneurysm. No lymphadenopathy. Small mesenteric and portacaval lymph nodes without pathologic enlargement. Reproductive: Penile prosthetic reservoir noted in the right hemipelvis. Small fat containing inguinal canals bilaterally. No bowel herniation. Mildly enlarged prostate measuring 5.1 x 4.1 x 4.1 cm impressing upon the base of the bladder. Unremarkable seminal vesicles. Other: No free air nor free fluid. Musculoskeletal: Subtle osteolytic lesions of the thoracolumbar spine from approximately T11 through L5 vertebral bodies anteriorly with pathologic fracture of the L2 vertebral body involving the inferior endplate extending into a central osteolytic lesion, series 6, image 62. Small osteolytic focus along the superior endplate of L2 is also noted on the same image. Lower lumbar degenerative facet arthropathy from L4 through S1. Marked degenerative disc disease L5-S1. Mild bilateral foraminal encroachment from endplate spurring and facet hypertrophy at L5-S1. IMPRESSION: 1. Subtle osteolytic abnormalities involving the T11 through L5 vertebral bodies with pathologic appearing fracture involve the inferior endplate of L2 extending into a more centrally located osteolytic lesion of the vertebral body. No retropulsion is seen. 2. Lower lumbar degenerative facet arthropathy from L4 through S1.  Mild foraminal encroachment L5-S1 bilaterally from endplate spurring and facet arthropathy. 3. Left-sided renal cysts. 4. Uncomplicated cholelithiasis. Electronically Signed   By: Ashley Royalty M.D.   On: 08/11/2017 20:23   Dg Chest 1 View  Result Date: 08/11/2017 CLINICAL DATA:  Status post RIGHT thoracentesis. EXAM: CHEST  1 VIEW COMPARISON:  CT chest August 10, 2017 and July 30, 2017 FINDINGS: Cardiomediastinal silhouette is normal. Calcified aortic knob. Trace RIGHT pleural effusion. RIGHT upper lobe mass again noted. No pneumothorax. Mild chronic interstitial changes. Soft tissue planes and included osseous structures are unchanged. IMPRESSION: Trace residual RIGHT pleural effusion.  No pneumothorax. RIGHT upper lobe spiculated mass better characterized on prior CT. Aortic Atherosclerosis (ICD10-I70.0). Electronically Signed   By: Elon Alas M.D.   On: 08/11/2017 17:58   Ct Chest W Contrast  Result Date: 08/10/2017 CLINICAL DATA:  84 year old male with multiple falls over the past 3 weeks. Right upper lobe pulmonary mass seen on recent CT. Patient scheduled for PET-CT. EXAM: CT CHEST WITH CONTRAST TECHNIQUE: Multidetector CT imaging of the chest was performed during intravenous contrast administration. CONTRAST:  91mL ISOVUE-300 IOPAMIDOL (ISOVUE-300) INJECTION 61% COMPARISON:  07/30/2017 CT FINDINGS: Cardiovascular: Normal size heart without pericardial effusion. There is coronary arteriosclerosis involving the left main, lad and RCA. And aortic atherosclerosis. No aneurysm or dissection. Suboptimal opacification of the pulmonary arteries for assessment of pulmonary embolus. Mediastinum/Nodes: No thyromegaly or discrete thyroid mass. Unremarkable CT appearance of the esophagus. No axillary adenopathy. Enlarged 14 mm right paratracheal short axis lymph node, 14 mm right hilar lymph node and 11 mm subcarinal lymph node. No significant progression  since recent comparison. No left hilar adenopathy.  Lungs/Pleura: Redemonstration of small to moderate right-sided pleural effusion with adjacent compressive atelectasis. Spiculated masslike abnormality in the posterior segment of right upper lobe measuring 4.3 x 2.4 cm is redemonstrated and unchanged. Subpleural areas of interstitial fibrosis and mild peribronchial thickening is seen bilaterally. Upper Abdomen: Stable too small to further characterize right hepatic hypodensity measuring 5 mm. Water attenuating partially included left upper pole renal cyst measuring 7.4 cm. Musculoskeletal: Osseous metastasis noted with slightly expansile lytic lesion in the posterior left second rib, lateral left seventh rib with pathologic fracture, right eighth rib, lytic and sclerotic L2 vertebral lesion and lytic posterior T9 vertebral lesion. Additionally there is a lytic abnormality involving the inferior right scapula without pathologic fracture. IMPRESSION: 1. Unchanged 4.3 x 2.4 cm spiculated mass abutting the major fissure in the posterior segment of right upper lobe. Associated small to moderate right-sided pleural effusion with adjacent compressive atelectasis. 2. Mediastinal and right hilar adenopathy is stable. 3. Osseous metastasis with lytic abnormalities noted of the right scapula, right eighth rib in addition to previously noted left-sided second and seventh rib fractures as well as lytic abnormalities involving the posterior T9 and L2 vertebral bodies. No pathologic fracture on the right. Aortic Atherosclerosis (ICD10-I70.0). Electronically Signed   By: Ashley Royalty M.D.   On: 08/10/2017 22:01   Mr Jeri Cos BO Contrast  Result Date: 08/11/2017 CLINICAL DATA:  Lung cancer staging EXAM: MRI HEAD WITHOUT AND WITH CONTRAST TECHNIQUE: Multiplanar, multiecho pulse sequences of the brain and surrounding structures were obtained without and with intravenous contrast. CONTRAST:  43mL MULTIHANCE GADOBENATE DIMEGLUMINE 529 MG/ML IV SOLN COMPARISON:  None. FINDINGS: BRAIN:  The midline structures are normal. There is no acute infarct or acute hemorrhage. No mass lesion, hydrocephalus, dural abnormality or extra-axial collection. The brain parenchymal signal is normal for the patient's age. No age-advanced or lobar predominant atrophy. No chronic microhemorrhage or superficial siderosis. VASCULAR: Major intracranial arterial and venous sinus flow voids are preserved. SKULL AND UPPER CERVICAL SPINE: The visualized skull base, calvarium, upper cervical spine and extracranial soft tissues are normal. SINUSES/ORBITS: No fluid levels or advanced mucosal thickening. No mastoid or middle ear effusion. Normal orbits. IMPRESSION: Normal aging brain.  No intracranial metastatic disease. Electronically Signed   By: Ulyses Jarred M.D.   On: 08/11/2017 20:41   Dg Hip Unilat W Or Wo Pelvis 2-3 Views Right  Result Date: 08/10/2017 CLINICAL DATA:  Right hip pain after 2 falls today. EXAM: DG HIP (WITH OR WITHOUT PELVIS) 2-3V RIGHT COMPARISON:  None. FINDINGS: The cortical margins of the bony pelvis and right hip are intact. No evidence of fracture. Pubic symphysis and sacroiliac joints are congruent. Both femoral heads are well-seated in the respective acetabula. Mild degenerative change of both hips with acetabular spurring. Penile prosthesis is incidentally noted. IMPRESSION: No fracture of the pelvis or right hip. Electronically Signed   By: Jeb Levering M.D.   On: 08/10/2017 21:02   US Thoracentesis Asp Pleural Space W/img Guide  Result Date: 08/11/2017 INDICATION: Patient with history of tobacco abuse; now with right lung mass, dyspnea, hilar/mediastinal adenopathy and lytic bony lesions. Request made for diagnostic and therapeutic right thoracentesis. EXAM: ULTRASOUND GUIDED DIAGNOSTIC AND THERAPEUTIC RIGHT THORACENTESIS MEDICATIONS: None COMPLICATIONS: None immediate. PROCEDURE: An ultrasound guided thoracentesis was thoroughly discussed with the patient and questions answered. The  benefits, risks, alternatives and complications were also discussed. The patient understands and wishes to proceed with the procedure. Written consent was  obtained. Ultrasound was performed to localize and mark an adequate pocket of fluid in the right chest. The area was then prepped and draped in the normal sterile fashion. 1% Lidocaine was used for local anesthesia. Under ultrasound guidance a 6 Fr Safe-T-Centesis catheter was introduced. Thoracentesis was performed. The catheter was removed and a dressing applied. FINDINGS: A total of approximately 1.1 liters of slightly hazy, yellow fluid was removed. Samples were sent to the laboratory as requested by the clinical team. IMPRESSION: Successful ultrasound guided diagnostic and therapeutic right thoracentesis yielding 1.1 liters of pleural fluid. Read by: Rowe Robert, PA-C Electronically Signed   By: Jacqulynn Cadet M.D.   On: 08/11/2017 17:19     Medical Consultants:    None.  Anti-Infectives:   None  Subjective:    Juanetta Beets relates his breathing is not improved continues to have cough.  Objective:    Vitals:   08/11/17 1655 08/11/17 1720 08/11/17 2152 08/12/17 0446  BP: (!) 114/51 (!) 133/58 (!) 145/64 133/61  Pulse:   69 71  Resp:   16 20  Temp:   98.9 F (37.2 C) 97.8 F (36.6 C)  TempSrc:   Oral Oral  SpO2:   98% 96%  Weight:      Height:        Intake/Output Summary (Last 24 hours) at 08/12/2017 0934 Last data filed at 08/12/2017 0600 Gross per 24 hour  Intake 2590 ml  Output 925 ml  Net 1665 ml   Filed Weights   08/10/17 1926  Weight: 69.4 kg (153 lb)    Exam: General exam: In no acute distress. Respiratory system: Good air movement and clear to auscultation. Cardiovascular system: S1 & S2 heard, RRR. Gastrointestinal system: Abdomen is nondistended, soft and nontender.  Central nervous system: Alert and oriented. No focal neurological deficits. Extremities: No pedal edema. Skin: No rashes,  lesions or ulcers    Data Reviewed:    Labs: Basic Metabolic Panel: Recent Labs  Lab 08/10/17 1319 08/10/17 2058 08/11/17 0546  NA 138 136 139  K 4.1 4.1 4.0  CL 104 104 109  CO2 25 22 18*  GLUCOSE 125 107* 94  BUN 27* 28* 23*  CREATININE 1.67* 1.55* 1.24  CALCIUM 10.2 9.3 9.0   GFR Estimated Creatinine Clearance: 43.7 mL/min (by C-G formula based on SCr of 1.24 mg/dL). Liver Function Tests: Recent Labs  Lab 08/10/17 1319 08/10/17 2058  AST 18 18  ALT 11 13*  ALKPHOS 85 77  BILITOT 0.3 0.4  PROT 6.8 6.5  ALBUMIN 2.8* 3.0*   No results for input(s): LIPASE, AMYLASE in the last 168 hours. No results for input(s): AMMONIA in the last 168 hours. Coagulation profile No results for input(s): INR, PROTIME in the last 168 hours.  CBC: Recent Labs  Lab 08/10/17 1319 08/10/17 2058 08/11/17 0546  WBC 18.2* 19.9* 16.6*  NEUTROABS 15.5* 16.7*  --   HGB  --  11.2* 10.8*  HCT 37.2* 35.4* 34.1*  MCV 88.9 90.8 90.2  PLT 338 345 322   Cardiac Enzymes: No results for input(s): CKTOTAL, CKMB, CKMBINDEX, TROPONINI in the last 168 hours. BNP (last 3 results) No results for input(s): PROBNP in the last 8760 hours. CBG: No results for input(s): GLUCAP in the last 168 hours. D-Dimer: No results for input(s): DDIMER in the last 72 hours. Hgb A1c: No results for input(s): HGBA1C in the last 72 hours. Lipid Profile: No results for input(s): CHOL, HDL, LDLCALC, TRIG, CHOLHDL, LDLDIRECT in  the last 72 hours. Thyroid function studies: No results for input(s): TSH, T4TOTAL, T3FREE, THYROIDAB in the last 72 hours.  Invalid input(s): FREET3 Anemia work up: No results for input(s): VITAMINB12, FOLATE, FERRITIN, TIBC, IRON, RETICCTPCT in the last 72 hours. Sepsis Labs: Recent Labs  Lab 08/10/17 1319 08/10/17 2058 08/11/17 0546  WBC 18.2* 19.9* 16.6*   Microbiology No results found for this or any previous visit (from the past 240 hour(s)).   Medications:   .  atenolol  50 mg Oral Daily  . citalopram  20 mg Oral Daily  . levothyroxine  125 mcg Oral QAC breakfast  . pantoprazole  40 mg Oral Daily  . simvastatin  40 mg Oral Daily   Continuous Infusions: . sodium chloride 100 mL/hr at 08/11/17 1615  . sodium chloride Stopped (08/11/17 1615)      LOS: 1 day   Charlynne Cousins  [facility] Pager [phone]  *Please refer to [email or web address], password [email or web address] to get updated schedule on who will round on this patient, as hospitalists switch teams weekly. If 7PM-7AM, please contact night-coverage at [email or web address], password [email or web address] for any overnight needs.  08/12/2017, 9:34 AM

## 2017-08-12 NOTE — Progress Notes (Signed)
PULMONARY / CRITICAL CARE MEDICINE   Name: Peter Velasquez MRN: [id number] DOB: 1934-01-26    ADMISSION DATE:  08/10/2017 CONSULTATION DATE:  08/11/2017  REFERRING MD:  Aileen Fass  CHIEF COMPLAINT:  Dyspnea, lung mass  HISTORY OF PRESENT ILLNESS:   84 year old male who smokes cigarettes at a rate of approximately 3/4 pack cigarettes daily from age 36 until age 7 who comes to our facility with diffuse pain throughout his entire body and a lung mass.  He says that he fell several weeks ago and had some left-sided chest pain so he came into his primary care physician who ordered a chest x-ray to assess for rib fractures.  He was found to have a right upper lobe mass.  A CT scan was performed and he was referred to our clinic.  However, he was supposed to be seen in clinic 4/8 but on 4/7 he devloped severe pain throughout his entire body which made him come to the hospital for further care.  He says that he has had progressive chest pain whenever he takes of breath over the last several weeks.  This is been slowly progressive.  He is also had slowly progressive shortness of breath during this time.  He does not have much of a cough.  No hemoptysis.  He is not smoking.  He was found to have a new right-sided pleural effusion.  Pulmonary and critical care medicine was consulted for further evaluation and consideration of bronchoscopy.   SUBJECTIVE:  Pt reports "many aches and pains".  States some improvement in SOB after thoracentesis but no resolved.  Denies cough.     VITAL SIGNS: BP 133/61 (BP Location: Left Arm)   Pulse 71   Temp 97.8 F (36.6 C) (Oral)   Resp 20   Ht 5\' 7"  (1.702 m)   Wt 153 lb (69.4 kg)   SpO2 96%   BMI 23.96 kg/m   HEMODYNAMICS:    VENTILATOR SETTINGS:    INTAKE / OUTPUT: I/O last 3 completed shifts: In: 3299 [P.O.:240; I.V.:2670; IV Piggyback:1000] Out: 1050 [Urine:1050]  PHYSICAL EXAMINATION: General:  Elderly male in NAD, sitting up in bed HEENT: MM  pink/moist, no jvd Neuro: AAOx4, speech clear, MAE CV: s1s2 rrr, no m/r/g PULM: even/non-labored, lungs bilaterally clear ME:QAST, non-tender, bsx4 active  Extremities: warm/dry, no edema  Skin: dry, tattoo on right FA  LABS:  BMET Recent Labs  Lab 08/10/17 1319 08/10/17 2058 08/11/17 0546  NA 138 136 139  K 4.1 4.1 4.0  CL 104 104 109  CO2 25 22 18*  BUN 27* 28* 23*  CREATININE 1.67* 1.55* 1.24  GLUCOSE 125 107* 94    Electrolytes Recent Labs  Lab 08/10/17 1319 08/10/17 2058 08/11/17 0546  CALCIUM 10.2 9.3 9.0    CBC Recent Labs  Lab 08/10/17 1319 08/10/17 2058 08/11/17 0546  WBC 18.2* 19.9* 16.6*  HGB  --  11.2* 10.8*  HCT 37.2* 35.4* 34.1*  PLT 338 345 322    Coag's No results for input(s): APTT, INR in the last 168 hours.  Sepsis Markers No results for input(s): LATICACIDVEN, PROCALCITON, O2SATVEN in the last 168 hours.  ABG No results for input(s): PHART, PCO2ART, PO2ART in the last 168 hours.  Liver Enzymes Recent Labs  Lab 08/10/17 1319 08/10/17 2058  AST 18 18  ALT 11 13*  ALKPHOS 85 77  BILITOT 0.3 0.4  ALBUMIN 2.8* 3.0*    Cardiac Enzymes No results for input(s): TROPONINI, PROBNP in the last 168  hours.  Glucose No results for input(s): GLUCAP in the last 168 hours.  Imaging Ct Abdomen Pelvis Wo Contrast  Result Date: 08/11/2017 CLINICAL DATA:  Right-sided back pain after fall radiating to the groin. 15 pound weight loss in the past week. Patient found to have a lung mass with pulmonary nodule lytic lesions. EXAM: CT ABDOMEN AND PELVIS WITHOUT CONTRAST TECHNIQUE: Multidetector CT imaging of the abdomen and pelvis was performed following the standard protocol without IV contrast. COMPARISON:  Report from May 06, 2000. FINDINGS: Lower chest: Top-normal size heart without pericardial effusion or thickening. Bilateral pleural calcifications consistent with prior asbestos exposure with trace right-sided pleural effusion and bibasilar  atelectasis. Hepatobiliary: Unenhanced liver is unremarkable. Gallbladder is physiologically distended faint densities along the dependent aspect suspicious for tiny gallstones. No biliary dilatation. Pancreas: No ductal dilatation, mass or peripancreatic inflammation. Spleen: Normal size spleen without mass. Adrenals/Urinary Tract: Normal bilateral adrenal glands. Dominant cyst arising from the upper pole of the left kidney measuring 7.9 x 6.8 x 6.1 cm, anechoic in appearance and consistent with a simple cyst. Smaller interpolar 2 cm cyst as an exophytic 1.6 cm cyst are noted of the left kidney. Subtle hyperdensities within the renal collecting systems and pelves compatible with previous administration of IV contrast. No hydroureteronephrosis. Contrast noted within the urinary bladder without focal mural thickening or calculus. Stomach/Bowel: Enteric contrast is seen within the stomach. Normal small bowel rotation without obstruction. Contrast reaches the cecum and extends to the transverse colon. No acute bowel inflammation or obstruction. Normal appendix. Vascular/Lymphatic: Moderate aortoiliac atherosclerosis without aneurysm. No lymphadenopathy. Small mesenteric and portacaval lymph nodes without pathologic enlargement. Reproductive: Penile prosthetic reservoir noted in the right hemipelvis. Small fat containing inguinal canals bilaterally. No bowel herniation. Mildly enlarged prostate measuring 5.1 x 4.1 x 4.1 cm impressing upon the base of the bladder. Unremarkable seminal vesicles. Other: No free air nor free fluid. Musculoskeletal: Subtle osteolytic lesions of the thoracolumbar spine from approximately T11 through L5 vertebral bodies anteriorly with pathologic fracture of the L2 vertebral body involving the inferior endplate extending into a central osteolytic lesion, series 6, image 62. Small osteolytic focus along the superior endplate of L2 is also noted on the same image. Lower lumbar degenerative facet  arthropathy from L4 through S1. Marked degenerative disc disease L5-S1. Mild bilateral foraminal encroachment from endplate spurring and facet hypertrophy at L5-S1. IMPRESSION: 1. Subtle osteolytic abnormalities involving the T11 through L5 vertebral bodies with pathologic appearing fracture involve the inferior endplate of L2 extending into a more centrally located osteolytic lesion of the vertebral body. No retropulsion is seen. 2. Lower lumbar degenerative facet arthropathy from L4 through S1. Mild foraminal encroachment L5-S1 bilaterally from endplate spurring and facet arthropathy. 3. Left-sided renal cysts. 4. Uncomplicated cholelithiasis. Electronically Signed   By: Ashley Royalty M.D.   On: 08/11/2017 20:23   Dg Chest 1 View  Result Date: 08/11/2017 CLINICAL DATA:  Status post RIGHT thoracentesis. EXAM: CHEST  1 VIEW COMPARISON:  CT chest August 10, 2017 and July 30, 2017 FINDINGS: Cardiomediastinal silhouette is normal. Calcified aortic knob. Trace RIGHT pleural effusion. RIGHT upper lobe mass again noted. No pneumothorax. Mild chronic interstitial changes. Soft tissue planes and included osseous structures are unchanged. IMPRESSION: Trace residual RIGHT pleural effusion.  No pneumothorax. RIGHT upper lobe spiculated mass better characterized on prior CT. Aortic Atherosclerosis (ICD10-I70.0). Electronically Signed   By: Elon Alas M.D.   On: 08/11/2017 17:58   Mr Jeri Cos MV Contrast  Result Date: 08/11/2017  CLINICAL DATA:  Lung cancer staging EXAM: MRI HEAD WITHOUT AND WITH CONTRAST TECHNIQUE: Multiplanar, multiecho pulse sequences of the brain and surrounding structures were obtained without and with intravenous contrast. CONTRAST:  28mL MULTIHANCE GADOBENATE DIMEGLUMINE 529 MG/ML IV SOLN COMPARISON:  None. FINDINGS: BRAIN: The midline structures are normal. There is no acute infarct or acute hemorrhage. No mass lesion, hydrocephalus, dural abnormality or extra-axial collection. The brain  parenchymal signal is normal for the patient's age. No age-advanced or lobar predominant atrophy. No chronic microhemorrhage or superficial siderosis. VASCULAR: Major intracranial arterial and venous sinus flow voids are preserved. SKULL AND UPPER CERVICAL SPINE: The visualized skull base, calvarium, upper cervical spine and extracranial soft tissues are normal. SINUSES/ORBITS: No fluid levels or advanced mucosal thickening. No mastoid or middle ear effusion. Normal orbits. IMPRESSION: Normal aging brain.  No intracranial metastatic disease. Electronically Signed   By: Ulyses Jarred M.D.   On: 08/11/2017 20:41   US Thoracentesis Asp Pleural Space W/img Guide  Result Date: 08/11/2017 INDICATION: Patient with history of tobacco abuse; now with right lung mass, dyspnea, hilar/mediastinal adenopathy and lytic bony lesions. Request made for diagnostic and therapeutic right thoracentesis. EXAM: ULTRASOUND GUIDED DIAGNOSTIC AND THERAPEUTIC RIGHT THORACENTESIS MEDICATIONS: None COMPLICATIONS: None immediate. PROCEDURE: An ultrasound guided thoracentesis was thoroughly discussed with the patient and questions answered. The benefits, risks, alternatives and complications were also discussed. The patient understands and wishes to proceed with the procedure. Written consent was obtained. Ultrasound was performed to localize and mark an adequate pocket of fluid in the right chest. The area was then prepped and draped in the normal sterile fashion. 1% Lidocaine was used for local anesthesia. Under ultrasound guidance a 6 Fr Safe-T-Centesis catheter was introduced. Thoracentesis was performed. The catheter was removed and a dressing applied. FINDINGS: A total of approximately 1.1 liters of slightly hazy, yellow fluid was removed. Samples were sent to the laboratory as requested by the clinical team. IMPRESSION: Successful ultrasound guided diagnostic and therapeutic right thoracentesis yielding 1.1 liters of pleural fluid. Read  by: Rowe Robert, PA-C Electronically Signed   By: Jacqulynn Cadet M.D.   On: 08/11/2017 17:19    STUDIES:  CT Chest 4/8 >> 4.3 x 2.4 spiculated mass in the posterior segment of the right upper lobe, associated small moderate right-sided pleural effusion with adjacent compressive atelectasis.  Mediastinal and right hilar adenopathy.  Osseous metastasis with lytic abnormalities of the right scapula. CT ABD/Pelvis 4/9 >> subtle osteolytic abnormalities involving T11 through L5 vertebral bodies with pathologic appearing fracture involving the inferior endplate of L2 extending into a more centrally located osteolytic lesion of the vertebral body, no retropulsion.   MRI 4/9 >> normal aging brain, no intracranial metastatic disease   CULTURES:   ANTIBIOTICS:   SIGNIFICANT EVENTS: 4/08  Admit with pain, SOB  LINES/TUBES:   DISCUSSION: 84 year old male with a lengthy smoking history who came to our facility with complaints of diffuse body pain in the setting of what appears to be metastatic lung cancer.  He has a right-sided pleural effusion, large right sided mass, mediastinal and hilar adenopathy.  He also has a metastatic lesion in his right scapula.  Based on his diffuse pain, it is concerning that he may have metastatic lesions throughout his body.  ASSESSMENT / PLAN:  A: Right-sided pleural effusion Right upper lobe lung mass with mediastinal lymphadenopathy and what appears to be metastatic lesions, likely bronchogenic carcinoma Former smoker Hip and abdominal pain Mild confusion  P:   Plan for  EBUS on 4/15 at 0730 am NPO after MN on 4/15 (order placed) Follow cytology > if small cell lung cancer, he will not need further tissue sampling / EBUS Continue pain control  Intermittent CXR  Pulmonary hygiene - mobilize, IS as able    Noe Gens, NP-C [facility] Pgr: [phone] or if no answer [phone] 08/12/2017, 11:26 AM

## 2017-08-13 MED ORDER — LIP MEDEX EX OINT
TOPICAL_OINTMENT | CUTANEOUS | Status: DC | PRN
  Administered 2017-08-13: 13:00:00 via TOPICAL
  Filled 2017-08-13: qty 7

## 2017-08-13 MED ORDER — MORPHINE SULFATE ER 15 MG PO TBCR
15.0000 mg | EXTENDED_RELEASE_TABLET | Freq: Two times a day (BID) | ORAL | Status: DC
  Administered 2017-08-13 – 2017-08-14 (×3): 15 mg via ORAL
  Filled 2017-08-13 (×3): qty 1

## 2017-08-13 MED ORDER — HYDROCORTISONE 1 % EX CREA
TOPICAL_CREAM | CUTANEOUS | Status: DC | PRN
Start: 2017-08-13 — End: 2017-08-14
  Administered 2017-08-13: 13:00:00 via TOPICAL
  Filled 2017-08-13: qty 28

## 2017-08-13 MED ORDER — DIPHENHYDRAMINE HCL 25 MG PO CAPS
25.0000 mg | ORAL_CAPSULE | Freq: Four times a day (QID) | ORAL | Status: DC | PRN
  Administered 2017-08-13: 25 mg via ORAL
  Filled 2017-08-13: qty 1

## 2017-08-13 NOTE — Progress Notes (Signed)
[facility] PROGRESS NOTE    Progress Note  Peter Velasquez  OHY:[id number] DOB: 1934/02/01 DOA: 08/10/2017 PCP: [name], MD     Brief Narrative:   Peter Velasquez is an 84 y.o. male with PMH of HTN, COPD, depression  Comes in with back pain and fall.  He reports constant pain on the right side of his back and buttock radiating to the groin area.  He has no relief despite narcotics.  He also has a 15 pound weight loss in the last week with cold sweats and shortness of breath, he was found to have a lung mass with a pulmonary nodule and lytic lesions, was scheduled to follow-up with Dr. [name]  Assessment/Plan:   Metastasis disease with bone involvement: Pain control, change narcotics to long-acting and short-acting for breakthrough. CT abd and pelvis : Diffuse metastatic disease T11 through L5 with no retropulsion, pathologic fracture involving the inferior endplate of L4 MRI of the brain showed no acute findings. pulmonary has been consulted and he is tentatively scheduled for endobronchial. PT consulted and recommended home health PT.  Malignant pleural effusion Status post thoracocentesis by IR on 08/11/2017 in the ER 1 L of fluid cytology pending  Hypothyroidism Cont synthroid  AKI (acute kidney injury) (Cataract) Likely prerenal in etiology resolved with IV fluid KVO normal saline.  Essential hypertension: Continue beta-blockers, continue to hold ACE inhibitor and diuretics.    Depression: Continue Celexa.  Normocytic normochromic anemia Mild drop in hemoglobin from 11.2-10.8, will continue to monitor no signs of overt bleeding.    DVT prophylaxis: SCD Family Communication:none Disposition Plan/Barrier to D/C: Once breathing improved and after endobronchial biopsy. Code Status:     Code Status Orders  (From admission, onward)        Start     Ordered   08/11/17 0100  Do not attempt resuscitation (DNR)  Continuous    Question Answer Comment  In the event  of cardiac or respiratory ARREST Do not call a "code blue"   In the event of cardiac or respiratory ARREST Do not perform Intubation, CPR, defibrillation or ACLS   In the event of cardiac or respiratory ARREST Use medication by any route, position, wound care, and other measures to relive pain and suffering. May use oxygen, suction and manual treatment of airway obstruction as needed for comfort.      08/11/17 0100    Code Status History    Date Active Date Inactive Code Status Order ID Comments User Context   04/29/2017 1517 05/01/2017 1907 DNR [id number]  Benjamine Mola, FNP Inpatient   04/23/2017 1608 04/29/2017 1505 DNR [id number]  Domenic Polite, MD Inpatient        IV Access:    Peripheral IV   Procedures and diagnostic studies:   Ct Abdomen Pelvis Wo Contrast  Result Date: 08/11/2017 CLINICAL DATA:  Right-sided back pain after fall radiating to the groin. 15 pound weight loss in the past week. Patient found to have a lung mass with pulmonary nodule lytic lesions. EXAM: CT ABDOMEN AND PELVIS WITHOUT CONTRAST TECHNIQUE: Multidetector CT imaging of the abdomen and pelvis was performed following the standard protocol without IV contrast. COMPARISON:  Report from May 06, 2000. FINDINGS: Lower chest: Top-normal size heart without pericardial effusion or thickening. Bilateral pleural calcifications consistent with prior asbestos exposure with trace right-sided pleural effusion and bibasilar atelectasis. Hepatobiliary: Unenhanced liver is unremarkable. Gallbladder is physiologically distended faint densities along the dependent aspect suspicious for tiny gallstones. No biliary  dilatation. Pancreas: No ductal dilatation, mass or peripancreatic inflammation. Spleen: Normal size spleen without mass. Adrenals/Urinary Tract: Normal bilateral adrenal glands. Dominant cyst arising from the upper pole of the left kidney measuring 7.9 x 6.8 x 6.1 cm, anechoic in appearance and consistent with a  simple cyst. Smaller interpolar 2 cm cyst as an exophytic 1.6 cm cyst are noted of the left kidney. Subtle hyperdensities within the renal collecting systems and pelves compatible with previous administration of IV contrast. No hydroureteronephrosis. Contrast noted within the urinary bladder without focal mural thickening or calculus. Stomach/Bowel: Enteric contrast is seen within the stomach. Normal small bowel rotation without obstruction. Contrast reaches the cecum and extends to the transverse colon. No acute bowel inflammation or obstruction. Normal appendix. Vascular/Lymphatic: Moderate aortoiliac atherosclerosis without aneurysm. No lymphadenopathy. Small mesenteric and portacaval lymph nodes without pathologic enlargement. Reproductive: Penile prosthetic reservoir noted in the right hemipelvis. Small fat containing inguinal canals bilaterally. No bowel herniation. Mildly enlarged prostate measuring 5.1 x 4.1 x 4.1 cm impressing upon the base of the bladder. Unremarkable seminal vesicles. Other: No free air nor free fluid. Musculoskeletal: Subtle osteolytic lesions of the thoracolumbar spine from approximately T11 through L5 vertebral bodies anteriorly with pathologic fracture of the L2 vertebral body involving the inferior endplate extending into a central osteolytic lesion, series 6, image 62. Small osteolytic focus along the superior endplate of L2 is also noted on the same image. Lower lumbar degenerative facet arthropathy from L4 through S1. Marked degenerative disc disease L5-S1. Mild bilateral foraminal encroachment from endplate spurring and facet hypertrophy at L5-S1. IMPRESSION: 1. Subtle osteolytic abnormalities involving the T11 through L5 vertebral bodies with pathologic appearing fracture involve the inferior endplate of L2 extending into a more centrally located osteolytic lesion of the vertebral body. No retropulsion is seen. 2. Lower lumbar degenerative facet arthropathy from L4 through S1.  Mild foraminal encroachment L5-S1 bilaterally from endplate spurring and facet arthropathy. 3. Left-sided renal cysts. 4. Uncomplicated cholelithiasis. Electronically Signed   By: Ashley Royalty M.D.   On: 08/11/2017 20:23   Dg Chest 1 View  Result Date: 08/11/2017 CLINICAL DATA:  Status post RIGHT thoracentesis. EXAM: CHEST  1 VIEW COMPARISON:  CT chest August 10, 2017 and July 30, 2017 FINDINGS: Cardiomediastinal silhouette is normal. Calcified aortic knob. Trace RIGHT pleural effusion. RIGHT upper lobe mass again noted. No pneumothorax. Mild chronic interstitial changes. Soft tissue planes and included osseous structures are unchanged. IMPRESSION: Trace residual RIGHT pleural effusion.  No pneumothorax. RIGHT upper lobe spiculated mass better characterized on prior CT. Aortic Atherosclerosis (ICD10-I70.0). Electronically Signed   By: Elon Alas M.D.   On: 08/11/2017 17:58   Mr Jeri Cos HE Contrast  Result Date: 08/11/2017 CLINICAL DATA:  Lung cancer staging EXAM: MRI HEAD WITHOUT AND WITH CONTRAST TECHNIQUE: Multiplanar, multiecho pulse sequences of the brain and surrounding structures were obtained without and with intravenous contrast. CONTRAST:  47mL MULTIHANCE GADOBENATE DIMEGLUMINE 529 MG/ML IV SOLN COMPARISON:  None. FINDINGS: BRAIN: The midline structures are normal. There is no acute infarct or acute hemorrhage. No mass lesion, hydrocephalus, dural abnormality or extra-axial collection. The brain parenchymal signal is normal for the patient's age. No age-advanced or lobar predominant atrophy. No chronic microhemorrhage or superficial siderosis. VASCULAR: Major intracranial arterial and venous sinus flow voids are preserved. SKULL AND UPPER CERVICAL SPINE: The visualized skull base, calvarium, upper cervical spine and extracranial soft tissues are normal. SINUSES/ORBITS: No fluid levels or advanced mucosal thickening. No mastoid or middle ear effusion. Normal orbits.  IMPRESSION: Normal aging brain.   No intracranial metastatic disease. Electronically Signed   By: Ulyses Jarred M.D.   On: 08/11/2017 20:41   US Thoracentesis Asp Pleural Space W/img Guide  Result Date: 08/11/2017 INDICATION: Patient with history of tobacco abuse; now with right lung mass, dyspnea, hilar/mediastinal adenopathy and lytic bony lesions. Request made for diagnostic and therapeutic right thoracentesis. EXAM: ULTRASOUND GUIDED DIAGNOSTIC AND THERAPEUTIC RIGHT THORACENTESIS MEDICATIONS: None COMPLICATIONS: None immediate. PROCEDURE: An ultrasound guided thoracentesis was thoroughly discussed with the patient and questions answered. The benefits, risks, alternatives and complications were also discussed. The patient understands and wishes to proceed with the procedure. Written consent was obtained. Ultrasound was performed to localize and mark an adequate pocket of fluid in the right chest. The area was then prepped and draped in the normal sterile fashion. 1% Lidocaine was used for local anesthesia. Under ultrasound guidance a 6 Fr Safe-T-Centesis catheter was introduced. Thoracentesis was performed. The catheter was removed and a dressing applied. FINDINGS: A total of approximately 1.1 liters of slightly hazy, yellow fluid was removed. Samples were sent to the laboratory as requested by the clinical team. IMPRESSION: Successful ultrasound guided diagnostic and therapeutic right thoracentesis yielding 1.1 liters of pleural fluid. Read by: Rowe Robert, PA-C Electronically Signed   By: Jacqulynn Cadet M.D.   On: 08/11/2017 17:19     Medical Consultants:    None.  Anti-Infectives:   None  Subjective:    Peter Velasquez relates his breathing is not improved continues to have cough.  Objective:    Vitals:   08/12/17 2028 08/13/17 0422 08/13/17 1124 08/13/17 1124  BP:  (!) 133/55 114/62 114/62  Pulse:  65 69 69  Resp:  15    Temp: (!) 102.1 F (38.9 C) 98.2 F (36.8 C)    TempSrc: Oral Oral    SpO2:  93%      Weight:      Height:        Intake/Output Summary (Last 24 hours) at 08/13/2017 1216 Last data filed at 08/13/2017 0424 Gross per 24 hour  Intake 420 ml  Output 600 ml  Net -180 ml   Filed Weights   08/10/17 1926  Weight: 69.4 kg (153 lb)    Exam: General exam: In no acute distress. Respiratory system: Good air movement and clear to auscultation. Cardiovascular system: S1 & S2 heard, RRR. Gastrointestinal system: Abdomen is nondistended, soft and nontender.  Central nervous system: Alert and oriented. No focal neurological deficits. Extremities: No pedal edema. Skin: No rashes, lesions or ulcers    Data Reviewed:    Labs: Basic Metabolic Panel: Recent Labs  Lab 08/10/17 1319 08/10/17 2058 08/11/17 0546  NA 138 136 139  K 4.1 4.1 4.0  CL 104 104 109  CO2 25 22 18*  GLUCOSE 125 107* 94  BUN 27* 28* 23*  CREATININE 1.67* 1.55* 1.24  CALCIUM 10.2 9.3 9.0   GFR Estimated Creatinine Clearance: 43.7 mL/min (by C-G formula based on SCr of 1.24 mg/dL). Liver Function Tests: Recent Labs  Lab 08/10/17 1319 08/10/17 2058  AST 18 18  ALT 11 13*  ALKPHOS 85 77  BILITOT 0.3 0.4  PROT 6.8 6.5  ALBUMIN 2.8* 3.0*   No results for input(s): LIPASE, AMYLASE in the last 168 hours. No results for input(s): AMMONIA in the last 168 hours. Coagulation profile No results for input(s): INR, PROTIME in the last 168 hours.  CBC: Recent Labs  Lab 08/10/17 1319 08/10/17 2058 08/11/17  0546  WBC 18.2* 19.9* 16.6*  NEUTROABS 15.5* 16.7*  --   HGB  --  11.2* 10.8*  HCT 37.2* 35.4* 34.1*  MCV 88.9 90.8 90.2  PLT 338 345 322   Cardiac Enzymes: No results for input(s): CKTOTAL, CKMB, CKMBINDEX, TROPONINI in the last 168 hours. BNP (last 3 results) No results for input(s): PROBNP in the last 8760 hours. CBG: No results for input(s): GLUCAP in the last 168 hours. D-Dimer: No results for input(s): DDIMER in the last 72 hours. Hgb A1c: No results for input(s): HGBA1C in  the last 72 hours. Lipid Profile: No results for input(s): CHOL, HDL, LDLCALC, TRIG, CHOLHDL, LDLDIRECT in the last 72 hours. Thyroid function studies: No results for input(s): TSH, T4TOTAL, T3FREE, THYROIDAB in the last 72 hours.  Invalid input(s): FREET3 Anemia work up: No results for input(s): VITAMINB12, FOLATE, FERRITIN, TIBC, IRON, RETICCTPCT in the last 72 hours. Sepsis Labs: Recent Labs  Lab 08/10/17 1319 08/10/17 2058 08/11/17 0546  WBC 18.2* 19.9* 16.6*   Microbiology No results found for this or any previous visit (from the past 240 hour(s)).   Medications:   . atenolol  50 mg Oral Daily  . citalopram  20 mg Oral Daily  . feeding supplement (ENSURE ENLIVE)  237 mL Oral BID BM  . levothyroxine  125 mcg Oral QAC breakfast  . multivitamin with minerals  1 tablet Oral Daily  . pantoprazole  40 mg Oral Daily  . simvastatin  40 mg Oral Daily   Continuous Infusions: . sodium chloride Stopped (08/11/17 1615)      LOS: 2 days   [facility] Pager [phone]  *Please refer to [email or web address], password [email or web address] to get updated schedule on who will round on this patient, as hospitalists switch teams weekly. If 7PM-7AM, please contact night-coverage at [email or web address], password [email or web address] for any overnight needs.  08/13/2017, 12:16 PM

## 2017-08-13 NOTE — Evaluation (Signed)
Physical Therapy Evaluation Patient Details Name: Peter Velasquez MRN: [id number] DOB: 08-17-1933 Today's Date: 08/13/2017   History of Present Illness  84 y.o. male with PMH of HTN, COPD, depression  Comes in with back pain and fall.  He reports constant pain on the right side of his back and buttock radiating to the groin area.  He has no relief despite narcotics.  He also has a 15 pound weight loss in the last week with cold sweats and shortness of breath, he was found to have an L4 fracture and a lung mass with a pulmonary nodule and lytic lesions  Clinical Impression  Pt admitted with above diagnosis. Pt currently with functional limitations due to the deficits listed below (see PT Problem List). Pt ambulated 41' with RW, no loss of balance. Pt reports he'd been unable to tolerate activity recently due to back pain, however pain medication seems to be helping significantly. He reports 3 falls just prior to admission, HHPT recommended for home safety eval and balance training. Pt will benefit from skilled PT to increase their independence and safety with mobility to allow discharge to the venue listed below.       Follow Up Recommendations Home health PT    Equipment Recommendations  3in1 (PT)    Recommendations for Other Services       Precautions / Restrictions Precautions Precautions: Fall Precaution Comments: fell 3 days in a row PTA Restrictions Weight Bearing Restrictions: No      Mobility  Bed Mobility Overal bed mobility: Needs Assistance Bed Mobility: Rolling;Sidelying to Sit Rolling: Min assist Sidelying to sit: Min assist       General bed mobility comments: VCs for technique for log roll, min A to initiate roll and to raise trunk  Transfers Overall transfer level: Needs assistance Equipment used: Rolling walker (2 wheeled) Transfers: Sit to/from Stand Sit to Stand: Min assist         General transfer comment: VCs hand  placement  Ambulation/Gait Ambulation/Gait assistance: Min guard Ambulation Distance (Feet): 80 Feet Assistive device: Rolling walker (2 wheeled) Gait Pattern/deviations: Step-through pattern;Trunk flexed;Decreased stride length   Gait velocity interpretation: Below normal speed for age/gender General Gait Details: VCs to lift head, pt able to partially correct flexed trunk/neck posture, pt denied pain with walking, no loss of balance, VCs for positioning in RW  Stairs            Wheelchair Mobility    Modified Rankin (Stroke Patients Only)       Balance Overall balance assessment: History of Falls;Needs assistance(pt fell 3x, 3 consectutive days PTA)   Sitting balance-Leahy Scale: Good     Standing balance support: Bilateral upper extremity supported Standing balance-Leahy Scale: Fair                               Pertinent Vitals/Pain Pain Assessment: No/denies pain    Home Living Family/patient expects to be discharged to:: Private residence Living Arrangements: Spouse/significant other Available Help at Discharge: Family;Available 24 hours/day   Home Access: Stairs to enter Entrance Stairs-Rails: Right Entrance Stairs-Number of Steps: 3 Home Layout: Two level Home Equipment: Walker - 4 wheels      Prior Function Level of Independence: Independent         Comments: wife just ordered a rollator     Hand Dominance        Extremity/Trunk Assessment   Upper Extremity Assessment Upper Extremity Assessment:  Overall [facility] for tasks assessed    Lower Extremity Assessment Lower Extremity Assessment: Overall WFL for tasks assessed(knee ext +4/5)    Cervical / Trunk Assessment Cervical / Trunk Assessment: Kyphotic  Communication   Communication: HOH  Cognition Arousal/Alertness: Awake/alert Behavior During Therapy: WFL for tasks assessed/performed Overall Cognitive Status: Within Functional Limits for tasks assessed                                         General Comments      Exercises     Assessment/Plan    PT Assessment Patient needs continued PT services  PT Problem List Decreased activity tolerance;Decreased mobility       PT Treatment Interventions DME instruction;Gait training;Therapeutic activities;Stair training;Therapeutic exercise;Functional mobility training;Balance training;Patient/family education    PT Goals (Current goals can be found in the Care Plan section)  Acute Rehab PT Goals Patient Stated Goal: to walk without pain PT Goal Formulation: With patient Time For Goal Achievement: 08/27/17 Potential to Achieve Goals: Good    Frequency Min 3X/week   Barriers to discharge Decreased caregiver support pt reports wife is in poor health, case manager to screen for [employer] services    Co-evaluation               AM-PAC PT "6 Clicks" Daily Activity  Outcome Measure Difficulty turning over in bed (including adjusting bedclothes, sheets and blankets)?: Unable Difficulty moving from lying on back to sitting on the side of the bed? : Unable Difficulty sitting down on and standing up from a chair with arms (e.g., wheelchair, bedside commode, etc,.)?: A Lot Help needed moving to and from a bed to chair (including a wheelchair)?: A Little Help needed walking in hospital room?: A Little Help needed climbing 3-5 steps with a railing? : A Lot 6 Click Score: 12    End of Session Equipment Utilized During Treatment: Gait belt Activity Tolerance: Patient tolerated treatment well Patient left: in chair;with call bell/phone within reach;with chair alarm set Nurse Communication: Mobility status PT Visit Diagnosis: History of falling (Z91.81);Repeated falls (R29.6);Difficulty in walking, not elsewhere classified (R26.2)    Time: 2010-0712 PT Time Calculation (min) (ACUTE ONLY): 38 min   Charges:   PT Evaluation $PT Eval Low Complexity: 1 Low PT Treatments $Gait Training: 8-22  mins $Therapeutic Activity: 8-22 mins   PT G Codes:         Philomena Doheny 08/13/2017, 11:12 AM [phone]

## 2017-08-13 NOTE — Care Management Note (Signed)
Case Management Note  Patient Details  Name: Peter Velasquez MRN: [id number] Date of Birth: 02-Sep-1933  Subjective/Objective: PT recc HHPT,aide. Patient qualifes for [facility] 1st program-rep [facility] aware of d/c in am, [facility] will deliver 3n1 to rm prior d/c.                    Action/Plan:d/c home w/HHC/dme.   Expected Discharge Date:                  Expected Discharge Plan:  [place]  In-House Referral:     Discharge planning Services  CM Consult  Post Acute Care Choice:    Choice offered to:  Patient  DME Arranged:  3-N-1 DME Agency:  [place]:  PT, Nurse's Aide [place] Agency:  [place]  Status of Service:  Completed, signed off  If discussed at [place] of Stay Meetings, dates discussed:    Additional Comments:  Dessa Phi, RN 08/13/2017, 2:40 PM

## 2017-08-14 MED ORDER — OXYCODONE-ACETAMINOPHEN 5-325 MG PO TABS: 2.0000 | ORAL_TABLET | ORAL | 0 refills | Status: DC | PRN

## 2017-08-14 MED ORDER — MORPHINE SULFATE ER 15 MG PO TBCR: 15.0000 mg | EXTENDED_RELEASE_TABLET | Freq: Two times a day (BID) | ORAL | 0 refills | Status: DC

## 2017-08-14 NOTE — Care Management Note (Signed)
Case Management Note  Patient Details  Name: Peter Velasquez MRN: [id number] Date of Birth: 03-Feb-1934  Subjective/Objective: [facility] 2st program initiated, & [facility] dme 3n1 delivered. No further CM needs.                   Action/Plan:d/c home w/HHC/dme.   Expected Discharge Date:  08/14/17               Expected Discharge Plan:  [place]  In-House Referral:     Discharge planning Services  CM Consult  Post Acute Care Choice:    Choice offered to:  Patient  DME Arranged:  3-N-1 DME Agency:  [place]:  PT, Nurse's Aide, RN [facility] Agency:  [place]  Status of Service:  Completed, signed off  If discussed at [place] of Stay Meetings, dates discussed:    Additional Comments:  Dessa Phi, RN 08/14/2017, 12:18 PM

## 2017-08-14 NOTE — Discharge Summary (Addendum)
Physician Discharge Summary  Peter Velasquez QMV:[id number] DOB: 04-24-1934 DOA: 08/10/2017  PCP: Peter Low, MD  Admit date: 08/10/2017 Discharge date: 08/14/2017  Admitted From: home Disposition:  Home  Recommendations for Outpatient Follow-up:  1. Follow up with PCP in 1-2 weeks 2. Please obtain BMP/CBC in one week 3. He will follow-up at [facility] long and 08/17/2017 for EBUS and biopsy with results since to his oncologist Dr. Julien Velasquez.   Home Health:Yes Equipment/Devices:   Discharge Condition:stable CODE STATUS:full Diet recommendation: Heart Healthy  Brief/Interim Summary: 84 y.o. male with PMH of HTN, COPD, depression  Comes in with back pain and fall.  He reports constant pain on the right side of his back and buttock radiating to the groin area.  He has no relief despite narcotics.  He also has a 15 pound weight loss in the last week with cold sweats and shortness of breath, he was found to have a lung mass with a pulmonary nodule and lytic lesions, was scheduled to follow-up with Dr. Julien Velasquez  Discharge Diagnoses:  Principal Problem:   Malignant pleural effusion Active Problems:   Hypothyroidism   AKI (acute kidney injury) ([place])   Normocytic normochromic anemia   Metastasis (HCC)   Protein-calorie malnutrition, severe  Metastatic non-small cell carcinoma of the lung with metastatic disease to the bone: CT scan of the abdomen and pelvis showed diffuse metastatic disease in T11 through L5. MRI of the brain showed no acute findings. Thoracocentesis was performed and cytology results showed non-small carcinoma. Pulmonary was consulted for endoscopy which will be performed as an outpatient on 08/17/2017.  Malignant pleural effusion: Status post thoracocentesis on 08/11/2017  Hypothyroidism: Continue Synthroid.  Acute kidney injury: Likely prerenal in etiology resolved with IV fluid.  Essential hypertension: On admission his ACE inhibitor and diuretics were held, his  beta-blocker was continued. We will continue to hold ACE inhibitor and diuretics continue as an outpatient only beta-blockers.  Depression: Continue Celexa.  Normocytic anemia: Hemoglobin remained stable no signs of overt bleeding.  Discharge Instructions  Discharge Instructions    Diet - Velasquez sodium heart healthy   Complete by:  As directed    Increase activity slowly   Complete by:  As directed      Allergies as of 08/14/2017   No Known Allergies     Medication List    STOP taking these medications   benazepril 20 MG tablet Commonly known as:  LOTENSIN   hydrochlorothiazide 25 MG tablet Commonly known as:  HYDRODIURIL   HYDROcodone-acetaminophen 10-325 MG tablet Commonly known as:  NORCO   metoprolol succinate 50 MG 24 hr tablet Commonly known as:  TOPROL-XL     TAKE these medications   aspirin EC 81 MG tablet Take 81 mg by mouth at bedtime.   atenolol 50 MG tablet Commonly known as:  TENORMIN Take 50 mg by mouth daily.   citalopram 20 MG tablet Commonly known as:  CELEXA Take 20 mg by mouth daily.   levothyroxine 125 MCG tablet Commonly known as:  SYNTHROID, LEVOTHROID Take 1 tablet by mouth daily.   morphine 15 MG 12 hr tablet Commonly known as:  MS CONTIN Take 1 tablet (15 mg total) by mouth every 12 (twelve) hours.   omeprazole 20 MG capsule Commonly known as:  PRILOSEC Take 20 mg by mouth daily.   oxyCODONE-acetaminophen 5-325 MG tablet Commonly known as:  PERCOCET/ROXICET Take 2 tablets by mouth every 4 (four) hours as needed for moderate pain.   simvastatin 40 MG  tablet Commonly known as:  ZOCOR Take 40 mg by mouth daily.            Durable Medical Equipment  (From admission, onward)        Start     Ordered   08/13/17 1440  For home use only DME 3 n 1  Once     08/13/17 1440     Follow-up Information    [facility] Follow up.   Specialty:  Home Health Services Why:  Home 1st program-HH physical  therapy,aide Contact information: [address] [place] [address] [phone]        [facility] - Dme Follow up.   Why:  bedside commode Contact information: [place] [address] [phone]          No Known Allergies  Consultations:  Pulmonary and critical care   Procedures/Studies: Ct Abdomen Pelvis Wo Contrast  Result Date: 08/11/2017 CLINICAL DATA:  Right-sided back pain after fall radiating to the groin. 15 pound weight loss in the past week. Patient found to have a lung mass with pulmonary nodule lytic lesions. EXAM: CT ABDOMEN AND PELVIS WITHOUT CONTRAST TECHNIQUE: Multidetector CT imaging of the abdomen and pelvis was performed following the standard protocol without IV contrast. COMPARISON:  Report from May 06, 2000. FINDINGS: Lower chest: Top-normal size heart without pericardial effusion or thickening. Bilateral pleural calcifications consistent with prior asbestos exposure with trace right-sided pleural effusion and bibasilar atelectasis. Hepatobiliary: Unenhanced liver is unremarkable. Gallbladder is physiologically distended faint densities along the dependent aspect suspicious for tiny gallstones. No biliary dilatation. Pancreas: No ductal dilatation, mass or peripancreatic inflammation. Spleen: Normal size spleen without mass. Adrenals/Urinary Tract: Normal bilateral adrenal glands. Dominant cyst arising from the upper pole of the left kidney measuring 7.9 x 6.8 x 6.1 cm, anechoic in appearance and consistent with a simple cyst. Smaller interpolar 2 cm cyst as an exophytic 1.6 cm cyst are noted of the left kidney. Subtle hyperdensities within the renal collecting systems and pelves compatible with previous administration of IV contrast. No hydroureteronephrosis. Contrast noted within the urinary bladder without focal mural thickening or calculus. Stomach/Bowel: Enteric contrast is seen within the stomach. Normal  small bowel rotation without obstruction. Contrast reaches the cecum and extends to the transverse colon. No acute bowel inflammation or obstruction. Normal appendix. Vascular/Lymphatic: Moderate aortoiliac atherosclerosis without aneurysm. No lymphadenopathy. Small mesenteric and portacaval lymph nodes without pathologic enlargement. Reproductive: Penile prosthetic reservoir noted in the right hemipelvis. Small fat containing inguinal canals bilaterally. No bowel herniation. Mildly enlarged prostate measuring 5.1 x 4.1 x 4.1 cm impressing upon the base of the bladder. Unremarkable seminal vesicles. Other: No free air nor free fluid. Musculoskeletal: Subtle osteolytic lesions of the thoracolumbar spine from approximately T11 through L5 vertebral bodies anteriorly with pathologic fracture of the L2 vertebral body involving the inferior endplate extending into a central osteolytic lesion, series 6, image 62. Small osteolytic focus along the superior endplate of L2 is also noted on the same image. Lower lumbar degenerative facet arthropathy from L4 through S1. Marked degenerative disc disease L5-S1. Mild bilateral foraminal encroachment from endplate spurring and facet hypertrophy at L5-S1. IMPRESSION: 1. Subtle osteolytic abnormalities involving the T11 through L5 vertebral bodies with pathologic appearing fracture involve the inferior endplate of L2 extending into a more centrally located osteolytic lesion of the vertebral body. No retropulsion is seen. 2. Lower lumbar degenerative facet arthropathy from L4 through S1. Mild foraminal encroachment  L5-S1 bilaterally from endplate spurring and facet arthropathy. 3. Left-sided renal cysts. 4. Uncomplicated cholelithiasis. Electronically Signed   By: Ashley Royalty M.D.   On: 08/11/2017 20:23   Dg Chest 1 View  Result Date: 08/11/2017 CLINICAL DATA:  Status post RIGHT thoracentesis. EXAM: CHEST  1 VIEW COMPARISON:  CT chest August 10, 2017 and July 30, 2017 FINDINGS:  Cardiomediastinal silhouette is normal. Calcified aortic knob. Trace RIGHT pleural effusion. RIGHT upper lobe mass again noted. No pneumothorax. Mild chronic interstitial changes. Soft tissue planes and included osseous structures are unchanged. IMPRESSION: Trace residual RIGHT pleural effusion.  No pneumothorax. RIGHT upper lobe spiculated mass better characterized on prior CT. Aortic Atherosclerosis (ICD10-I70.0). Electronically Signed   By: Elon Alas M.D.   On: 08/11/2017 17:58   Dg Ribs Bilateral W/chest  Result Date: 07/29/2017 CLINICAL DATA:  Posterior ribcage injury after a fall 1 week ago. Symptoms are greatest on the left. The patient also describes shortness of breath. EXAM: BILATERAL RIBS AND CHEST - 4+ VIEW COMPARISON:  Left rib series of January 29, 2015 FINDINGS: The lungs are well-expanded. There is new patchy increased density in the right upper lobe. There is a new small right pleural effusion. There is no pneumothorax. The left lung is well-expanded and clear. The heart and pulmonary vascularity are normal. There is calcification in the wall of the aortic arch. Right rib detail images reveal no acute displaced fracture. On the left there is a mildly displaced fracture of the lateral aspect of the seventh rib. No definite other rib fractures are observed. IMPRESSION: Abnormal appearance of the right upper lobe worrisome for malignancy though pneumonia could present in a similar fashion. There is also a small right pleural effusion. If the patient has symptoms of typical pneumonia, a follow-up chest x-ray in 2-3 weeks following antibiotic therapy would be recommended. If there are no typical pneumonia symptoms, chest CT scanning now is recommended. Mildly displaced fracture of the lateral aspect of the left seventh rib. No pneumothorax. Thoracic aortic atherosclerosis. These results will be called to the ordering clinician or representative by the Radiologist Assistant, and  communication documented in the PACS or zVision Dashboard. Electronically Signed   By: David  Martinique M.D.   On: 07/29/2017 16:48   Ct Chest W Contrast  Result Date: 08/10/2017 CLINICAL DATA:  84 year old male with multiple falls over the past 3 weeks. Right upper lobe pulmonary mass seen on recent CT. Patient scheduled for PET-CT. EXAM: CT CHEST WITH CONTRAST TECHNIQUE: Multidetector CT imaging of the chest was performed during intravenous contrast administration. CONTRAST:  55mL ISOVUE-300 IOPAMIDOL (ISOVUE-300) INJECTION 61% COMPARISON:  07/30/2017 CT FINDINGS: Cardiovascular: Normal size heart without pericardial effusion. There is coronary arteriosclerosis involving the left main, lad and RCA. And aortic atherosclerosis. No aneurysm or dissection. Suboptimal opacification of the pulmonary arteries for assessment of pulmonary embolus. Mediastinum/Nodes: No thyromegaly or discrete thyroid mass. Unremarkable CT appearance of the esophagus. No axillary adenopathy. Enlarged 14 mm right paratracheal short axis lymph node, 14 mm right hilar lymph node and 11 mm subcarinal lymph node. No significant progression since recent comparison. No left hilar adenopathy. Lungs/Pleura: Redemonstration of small to moderate right-sided pleural effusion with adjacent compressive atelectasis. Spiculated masslike abnormality in the posterior segment of right upper lobe measuring 4.3 x 2.4 cm is redemonstrated and unchanged. Subpleural areas of interstitial fibrosis and mild peribronchial thickening is seen bilaterally. Upper Abdomen: Stable too small to further characterize right hepatic hypodensity measuring 5 mm. Water attenuating partially included left  upper pole renal cyst measuring 7.4 cm. Musculoskeletal: Osseous metastasis noted with slightly expansile lytic lesion in the posterior left second rib, lateral left seventh rib with pathologic fracture, right eighth rib, lytic and sclerotic L2 vertebral lesion and lytic posterior  T9 vertebral lesion. Additionally there is a lytic abnormality involving the inferior right scapula without pathologic fracture. IMPRESSION: 1. Unchanged 4.3 x 2.4 cm spiculated mass abutting the major fissure in the posterior segment of right upper lobe. Associated small to moderate right-sided pleural effusion with adjacent compressive atelectasis. 2. Mediastinal and right hilar adenopathy is stable. 3. Osseous metastasis with lytic abnormalities noted of the right scapula, right eighth rib in addition to previously noted left-sided second and seventh rib fractures as well as lytic abnormalities involving the posterior T9 and L2 vertebral bodies. No pathologic fracture on the right. Aortic Atherosclerosis (ICD10-I70.0). Electronically Signed   By: Ashley Royalty M.D.   On: 08/10/2017 22:01   Ct Chest W Contrast  Result Date: 07/30/2017 CLINICAL DATA:  Lung mass. Rib fracture after recent fall. Dyspnea. EXAM: CT CHEST WITH CONTRAST TECHNIQUE: Multidetector CT imaging of the chest was performed during intravenous contrast administration. Creatinine was obtained on site at [place] at [address] Results: Creatinine 1.3 mg/dL. CONTRAST:  4mL ISOVUE-300 IOPAMIDOL (ISOVUE-300) INJECTION 61% COMPARISON:  Chest radiograph from one day prior. FINDINGS: Cardiovascular: Normal heart size. No significant pericardial fluid/thickening. Left main, left anterior descending and right coronary atherosclerosis. Atherosclerotic nonaneurysmal thoracic aorta. Normal caliber pulmonary arteries. No central pulmonary emboli. Mediastinum/Nodes: No discrete thyroid nodules. Unremarkable esophagus. No axillary adenopathy. Enlarged 1.5 cm right paratracheal node (series 2/image 64). Enlarged 1.4 cm subcarinal node (series 2/image 79). Enlarged 1.6 cm right hilar node (series 2/image 74). No left hilar adenopathy. Lungs/Pleura: No pneumothorax. Small dependent right pleural effusion. No left pleural effusion. Mild  centrilobular emphysema with mild diffuse bronchial wall thickening. There is a spiculated 4.6 x 2.4 cm posterior right upper lobe lung mass (series 8/image 51), which abuts the superior aspect of the right major fissure and involves the visceral pleura, with possible extension into the superior segment right lower lobe (series 8/image 58). No additional lung masses. No significant pulmonary nodules. Patchy subpleural reticulation and ground-glass attenuation throughout both lungs without significant traction bronchiectasis or frank honeycombing. Upper abdomen: Subcentimeter hypodense peripheral right liver lobe lesion, too small to characterize (series 2/image 166). Partially visualized simple exophytic 7.6 cm upper left renal cyst. Musculoskeletal: There are mildly expansile aggressive appearing lytic lesions in the posterior left second rib and lateral left seventh rib, with associated pathologic lateral left seventh rib fracture. Faint lytic and sclerotic L2 vertebral lesion (series 6/image 104). Faint lytic posterior T9 vertebral lesion (series 6/image 106). Mild thoracic spondylosis. IMPRESSION: 1. Spiculated 4.6 cm posterior right upper lobe lung mass suspicious for primary bronchogenic carcinoma, possibly extending into the superior segment right lower lobe. 2. Small dependent right pleural effusion, cannot exclude malignant effusion. 3. Right hilar, subcarinal and right paratracheal lymphadenopathy suspicious for nodal metastases. 4. Lytic osseous lesions in the left second and seventh ribs and T9 and L2 vertebral bodies, suspicious for osseous metastases. Associated pathologic lateral left seventh rib fracture. 5. Left main and two-vessel coronary atherosclerosis. 6. Nonspecific patchy subpleural reticulation and ground-glass attenuation throughout both lungs, cannot exclude an underlying interstitial lung disease such as nonspecific interstitial pneumonia (NSIP) or early usual interstitial pneumonia  (UIP). Aortic Atherosclerosis (ICD10-I70.0) and Emphysema (ICD10-J43.9). Electronically Signed   By: Ilona Sorrel M.D.   On:  07/30/2017 11:39   Mr Jeri Cos GB Contrast  Result Date: 08/11/2017 CLINICAL DATA:  Lung cancer staging EXAM: MRI HEAD WITHOUT AND WITH CONTRAST TECHNIQUE: Multiplanar, multiecho pulse sequences of the brain and surrounding structures were obtained without and with intravenous contrast. CONTRAST:  51mL MULTIHANCE GADOBENATE DIMEGLUMINE 529 MG/ML IV SOLN COMPARISON:  None. FINDINGS: BRAIN: The midline structures are normal. There is no acute infarct or acute hemorrhage. No mass lesion, hydrocephalus, dural abnormality or extra-axial collection. The brain parenchymal signal is normal for the patient's age. No age-advanced or lobar predominant atrophy. No chronic microhemorrhage or superficial siderosis. VASCULAR: Major intracranial arterial and venous sinus flow voids are preserved. SKULL AND UPPER CERVICAL SPINE: The visualized skull base, calvarium, upper cervical spine and extracranial soft tissues are normal. SINUSES/ORBITS: No fluid levels or advanced mucosal thickening. No mastoid or middle ear effusion. Normal orbits. IMPRESSION: Normal aging brain.  No intracranial metastatic disease. Electronically Signed   By: Ulyses Jarred M.D.   On: 08/11/2017 20:41   Dg Hip Unilat W Or Wo Pelvis 2-3 Views Right  Result Date: 08/10/2017 CLINICAL DATA:  Right hip pain after 2 falls today. EXAM: DG HIP (WITH OR WITHOUT PELVIS) 2-3V RIGHT COMPARISON:  None. FINDINGS: The cortical margins of the bony pelvis and right hip are intact. No evidence of fracture. Pubic symphysis and sacroiliac joints are congruent. Both femoral heads are well-seated in the respective acetabula. Mild degenerative change of both hips with acetabular spurring. Penile prosthesis is incidentally noted. IMPRESSION: No fracture of the pelvis or right hip. Electronically Signed   By: Jeb Levering M.D.   On: 08/10/2017 21:02    US Thoracentesis Asp Pleural Space W/img Guide  Result Date: 08/11/2017 INDICATION: Patient with history of tobacco abuse; now with right lung mass, dyspnea, hilar/mediastinal adenopathy and lytic bony lesions. Request made for diagnostic and therapeutic right thoracentesis. EXAM: ULTRASOUND GUIDED DIAGNOSTIC AND THERAPEUTIC RIGHT THORACENTESIS MEDICATIONS: None COMPLICATIONS: None immediate. PROCEDURE: An ultrasound guided thoracentesis was thoroughly discussed with the patient and questions answered. The benefits, risks, alternatives and complications were also discussed. The patient understands and wishes to proceed with the procedure. Written consent was obtained. Ultrasound was performed to localize and mark an adequate pocket of fluid in the right chest. The area was then prepped and draped in the normal sterile fashion. 1% Lidocaine was used for local anesthesia. Under ultrasound guidance a 6 Fr Safe-T-Centesis catheter was introduced. Thoracentesis was performed. The catheter was removed and a dressing applied. FINDINGS: A total of approximately 1.1 liters of slightly hazy, yellow fluid was removed. Samples were sent to the laboratory as requested by the clinical team. IMPRESSION: Successful ultrasound guided diagnostic and therapeutic right thoracentesis yielding 1.1 liters of pleural fluid. Read by: Rowe Robert, PA-C Electronically Signed   By: Jacqulynn Cadet M.D.   On: 08/11/2017 17:19     Subjective: He relates his pain is controlled  Discharge Exam: Vitals:   08/14/17 0448 08/14/17 0928  BP: (!) 128/59 (!) 119/58  Pulse: 69 69  Resp: 18   Temp: 97.7 F (36.5 C)   SpO2: 95%    Vitals:   08/13/17 1355 08/13/17 2151 08/14/17 0448 08/14/17 0928  BP: (!) 118/53 (!) 109/52 (!) 128/59 (!) 119/58  Pulse: 69 70 69 69  Resp: 19 16 18    Temp: 97.8 F (36.6 C) 98 F (36.7 C) 97.7 F (36.5 C)   TempSrc: Oral Oral Oral   SpO2: 96% 99% 95%   Weight:  Height:        General:  Pt is alert, awake, not in acute distress Cardiovascular: RRR, S1/S2 +, no rubs, no gallops Respiratory: CTA bilaterally, no wheezing, no rhonchi Abdominal: Soft, NT, ND, bowel sounds + Extremities: no edema, no cyanosis    The results of significant diagnostics from this hospitalization (including imaging, microbiology, ancillary and laboratory) are listed below for reference.     Microbiology: No results found for this or any previous visit (from the past 240 hour(s)).   Labs: BNP (last 3 results) No results for input(s): BNP in the last 8760 hours. Basic Metabolic Panel: Recent Labs  Lab 08/10/17 1319 08/10/17 2058 08/11/17 0546  NA 138 136 139  K 4.1 4.1 4.0  CL 104 104 109  CO2 25 22 18*  GLUCOSE 125 107* 94  BUN 27* 28* 23*  CREATININE 1.67* 1.55* 1.24  CALCIUM 10.2 9.3 9.0   Liver Function Tests: Recent Labs  Lab 08/10/17 1319 08/10/17 2058  AST 18 18  ALT 11 13*  ALKPHOS 85 77  BILITOT 0.3 0.4  PROT 6.8 6.5  ALBUMIN 2.8* 3.0*   No results for input(s): LIPASE, AMYLASE in the last 168 hours. No results for input(s): AMMONIA in the last 168 hours. CBC: Recent Labs  Lab 08/10/17 1319 08/10/17 2058 08/11/17 0546  WBC 18.2* 19.9* 16.6*  NEUTROABS 15.5* 16.7*  --   HGB  --  11.2* 10.8*  HCT 37.2* 35.4* 34.1*  MCV 88.9 90.8 90.2  PLT 338 345 322   Cardiac Enzymes: No results for input(s): CKTOTAL, CKMB, CKMBINDEX, TROPONINI in the last 168 hours. BNP: Invalid input(s): POCBNP CBG: No results for input(s): GLUCAP in the last 168 hours. D-Dimer No results for input(s): DDIMER in the last 72 hours. Hgb A1c No results for input(s): HGBA1C in the last 72 hours. Lipid Profile No results for input(s): CHOL, HDL, LDLCALC, TRIG, CHOLHDL, LDLDIRECT in the last 72 hours. Thyroid function studies No results for input(s): TSH, T4TOTAL, T3FREE, THYROIDAB in the last 72 hours.  Invalid input(s): FREET3 Anemia work up No results for input(s): VITAMINB12,  FOLATE, FERRITIN, TIBC, IRON, RETICCTPCT in the last 72 hours. Urinalysis    Component Value Date/Time   COLORURINE YELLOW 08/11/2017 0302   APPEARANCEUR CLEAR 08/11/2017 0302   LABSPEC 1.036 (H) 08/11/2017 0302   PHURINE 5.0 08/11/2017 0302   GLUCOSEU NEGATIVE 08/11/2017 0302   HGBUR MODERATE (A) 08/11/2017 0302   BILIRUBINUR NEGATIVE 08/11/2017 0302   KETONESUR 5 (A) 08/11/2017 0302   PROTEINUR NEGATIVE 08/11/2017 0302   NITRITE NEGATIVE 08/11/2017 0302   LEUKOCYTESUR NEGATIVE 08/11/2017 0302   Sepsis Labs Invalid input(s): PROCALCITONIN,  WBC,  LACTICIDVEN Microbiology No results found for this or any previous visit (from the past 240 hour(s)).   Time coordinating discharge: Over 30 minutes  SIGNED:   Charlynne Cousins, MD  [facility] 08/14/2017, 10:47 AM Pager   If 7PM-7AM, please contact night-coverage [email or web address] Password [email or web address]

## 2017-08-14 NOTE — Progress Notes (Signed)
EBUS arranged for 4/15 am 0700 at [facility].   Patient will need to arrive to admitting at 0600 at [facility] for check in/registration.  NPO after MN on 4/15.     Noe Gens, NP-C [facility] Pgr: [phone] or if no answer [phone] 08/14/2017, 8:29 AM

## 2017-08-14 NOTE — Progress Notes (Signed)
Physical Therapy Treatment Patient Details Name: Peter Velasquez MRN: [id number] DOB: March 23, 1934 Today's Date: 08/14/2017    History of Present Illness 84 y.o. male with PMH of HTN, COPD, depression  Comes in with back pain and fall.  He reports constant pain on the right side of his back and buttock radiating to the groin area.  He has no relief despite narcotics.  He also has a 15 pound weight loss in the last week with cold sweats and shortness of breath, he was found to have an L4 fracture and a lung mass with a pulmonary nodule and lytic lesions    PT Comments    Pt with overall improved mobility today, incr amb distance although does report LE fatigue after walking 200'; no LOB during amb with RW support although requires cues for safety and RW techniques; continue to recommend HHPT  Follow Up Recommendations  Home health PT     Equipment Recommendations  3in1 (PT)    Recommendations for Other Services       Precautions / Restrictions Precautions Precautions: Fall Precaution Comments: fell 3 days in 3 days PTA Restrictions Weight Bearing Restrictions: No    Mobility  Bed Mobility Overal bed mobility: Needs Assistance Bed Mobility: Rolling;Sidelying to Sit;Sit to Sidelying Rolling: Supervision Sidelying to sit: Min guard     Sit to sidelying: Min guard General bed mobility comments: VCs for technique for log roll, min/guard to elevate trunk  Transfers Overall transfer level: Needs assistance Equipment used: Rolling walker (2 wheeled) Transfers: Sit to/from Stand Sit to Stand: Min guard         General transfer comment: VCs hand placement  Ambulation/Gait Ambulation/Gait assistance: Min guard Ambulation Distance (Feet): 200 Feet Assistive device: Rolling walker (2 wheeled) Gait Pattern/deviations: Step-through pattern;Trunk flexed;Decreased stride length     General Gait Details: VCs to lift head, pt able to partially correct flexed trunk/neck posture, pt  denied pain with walking, no loss of balance, VCs for positioning in RW   Stairs             Wheelchair Mobility    Modified Rankin (Stroke Patients Only)       Balance     Sitting balance-Leahy Scale: Good     Standing balance support: Bilateral upper extremity supported;During functional activity Standing balance-Leahy Scale: (Fair static, reliant on UEs for dynamic)                              Cognition Arousal/Alertness: Awake/alert Behavior During Therapy: WFL for tasks assessed/performed Overall Cognitive Status: Within Functional Limits for tasks assessed                                        Exercises      General Comments        Pertinent Vitals/Pain      Home Living                      Prior Function            PT Goals (current goals can now be found in the care plan section) Acute Rehab PT Goals Patient Stated Goal: to walk without pain PT Goal Formulation: With patient Time For Goal Achievement: 08/27/17 Potential to Achieve Goals: Good Progress towards PT goals: Progressing toward goals    Frequency  Min 3X/week      PT Plan Current plan remains appropriate    Co-evaluation              AM-PAC PT "6 Clicks" Daily Activity  Outcome Measure  Difficulty turning over in bed (including adjusting bedclothes, sheets and blankets)?: A Little Difficulty moving from lying on back to sitting on the side of the bed? : A Little Difficulty sitting down on and standing up from a chair with arms (e.g., wheelchair, bedside commode, etc,.)?: A Lot Help needed moving to and from a bed to chair (including a wheelchair)?: A Little Help needed walking in hospital room?: A Little Help needed climbing 3-5 steps with a railing? : A Little 6 Click Score: 17    End of Session Equipment Utilized During Treatment: Gait belt Activity Tolerance: Patient tolerated treatment well Patient left: in  bed;with call bell/phone within reach;with bed alarm set Nurse Communication: Mobility status PT Visit Diagnosis: History of falling (Z91.81);Repeated falls (R29.6);Difficulty in walking, not elsewhere classified (R26.2)     Time: 3846-6599 PT Time Calculation (min) (ACUTE ONLY): 18 min  Charges:  $Gait Training: 8-22 mins                    G CodesKenyon Ana, PT Pager: [phone] 08/14/2017    Kenyon Ana 08/14/2017, 12:01 PM

## 2017-08-14 NOTE — Progress Notes (Signed)
Updated daughter Karna Christmas) and wife at bedside regarding pending procedure for Monday.  They understand that the patient needs to be at the hospital at 0600 on Monday at admitting.  Patients daughter is adamant that he have the PET scan on Monday after his EBUS in order to avoid a second trip.  We reviewed that there are parameters in place regarding to insurance and timing of PET scan (ie: he can not have it inpatient or until 3 days after discharge from inpatient).  She states that all of his work up has to be done before she leaves town and he is to see Dr. Earlie Server.  I personally called Nuclear Medicine and scheduled the PET scan for Wednesday 4/17 at 3:00 PM.  Pt to arrive at 2:30 PM.  He is not to eat or drink 6 hours before and no insulin.  Will order PET once patient discharged from hospital and call family for update.    Noe Gens, NP-C [facility] Pgr: [phone] or if no answer [phone] 08/14/2017, 2:53 PM

## 2017-08-15 DIAGNOSIS — C3411 Malignant neoplasm of upper lobe, right bronchus or lung: Secondary | ICD-10-CM | POA: Diagnosis not present

## 2017-08-15 DIAGNOSIS — J910 Malignant pleural effusion: Secondary | ICD-10-CM | POA: Diagnosis not present

## 2017-08-16 DIAGNOSIS — C3411 Malignant neoplasm of upper lobe, right bronchus or lung: Secondary | ICD-10-CM | POA: Diagnosis not present

## 2017-08-16 DIAGNOSIS — J910 Malignant pleural effusion: Secondary | ICD-10-CM | POA: Diagnosis not present

## 2017-08-17 ENCOUNTER — Other Ambulatory Visit: Payer: Self-pay | Admitting: Pulmonary Disease

## 2017-08-17 ENCOUNTER — Encounter (HOSPITAL_COMMUNITY): Admission: RE | Payer: Self-pay | Source: Ambulatory Visit

## 2017-08-17 ENCOUNTER — Telehealth: Payer: Self-pay | Admitting: Medical Oncology

## 2017-08-17 ENCOUNTER — Ambulatory Visit (HOSPITAL_COMMUNITY): Admission: RE | Admit: 2017-08-17 | Payer: Medicare Other | Source: Ambulatory Visit | Admitting: Pulmonary Disease

## 2017-08-17 ENCOUNTER — Ambulatory Visit (HOSPITAL_COMMUNITY): Payer: Medicare Other | Attending: Internal Medicine

## 2017-08-17 ENCOUNTER — Other Ambulatory Visit: Payer: Self-pay | Admitting: *Deleted

## 2017-08-17 ENCOUNTER — Encounter (HOSPITAL_COMMUNITY): Payer: Self-pay | Admitting: Anesthesiology

## 2017-08-17 ENCOUNTER — Encounter: Payer: Self-pay | Admitting: *Deleted

## 2017-08-17 DIAGNOSIS — J910 Malignant pleural effusion: Secondary | ICD-10-CM | POA: Diagnosis not present

## 2017-08-17 DIAGNOSIS — C3490 Malignant neoplasm of unspecified part of unspecified bronchus or lung: Secondary | ICD-10-CM

## 2017-08-17 DIAGNOSIS — C3411 Malignant neoplasm of upper lobe, right bronchus or lung: Secondary | ICD-10-CM | POA: Diagnosis not present

## 2017-08-17 SURGERY — ENDOBRONCHIAL ULTRASOUND (EBUS)
Anesthesia: General | Laterality: Bilateral

## 2017-08-17 MED ORDER — FENTANYL CITRATE (PF) 100 MCG/2ML IJ SOLN
INTRAMUSCULAR | Status: AC
  Filled 2017-08-17: qty 2

## 2017-08-17 MED ORDER — PROPOFOL 10 MG/ML IV BOLUS
INTRAVENOUS | Status: AC
  Filled 2017-08-17: qty 20

## 2017-08-17 NOTE — Progress Notes (Signed)
Oncology Nurse Navigator Documentation  Oncology Nurse Navigator Flowsheets 08/17/2017  Navigator Location [facility]  Navigator Encounter Type Other/per Dr. [name] he would like patient's recent cytology to be sent for foundation one and PDL 1.  I requested pathology to check to see if there is enough tissue to be sent.   Treatment Phase Pre-Tx/Tx Discussion  Barriers/Navigation Needs Coordination of Care  Interventions Coordination of Care  Coordination of Care Other  Acuity Level 2  Acuity Level 2 Other  Time Spent with Patient 30

## 2017-08-17 NOTE — Telephone Encounter (Addendum)
Returned call to dtr.  Pain is not controlled. Per Dr Jana Hakim ( on call at 0100) he increased MS contin to every 8 hours.  Next one due at 1530. He has not taken his percocet in between MS contin. Per [name] I told dtr to give percocet as ordered and he may also take Motrin 400 mg every 8 hours prn. She may  Need to take him to ED. Dtr asking if PET can be done before pt is admitted. I told yes . PET scan r/s to tomorrow. Per Radiology pt may take pain med with small sip of water. Dtr notified. This information called to [place].

## 2017-08-17 NOTE — Progress Notes (Signed)
PET CT ordered.  Appt 4/17 at [place] at [facility].  Left message on patients home answering machine.   Reviewed no food, drink or insulin 6 hours before the procedure.    Noe Gens, NP-C  Pulmonary & Critical Care Pgr: [phone] or if no answer [phone] 08/17/2017, 7:53 AM

## 2017-08-17 NOTE — Patient Outreach (Signed)
Mr. Studer enrolled in the [place] program on 08/14/17.  Notification was sent on 08/14/17 Friday to [place] [employer] staff to make aware.   Writer unable to reach family on 08/14/17 to discuss potential [facility] [employer] involvement if pharmacy or transportation needs are identified.   Attempted to call again today and spoke with daughter, [name], to make aware that [place] [employer] could potentially assist if transportation or pharmacy needs are identified while on the [place] program. [name] express appreciation.    Marthenia Rolling, MSN-Ed, RN,BSN [facility] Liaison [phone]

## 2017-08-18 ENCOUNTER — Emergency Department (HOSPITAL_COMMUNITY): Payer: Medicare Other

## 2017-08-18 ENCOUNTER — Inpatient Hospital Stay (HOSPITAL_COMMUNITY)
Admission: EM | Admit: 2017-08-18 | Discharge: 2017-08-21 | DRG: 180 | Disposition: A | Discharge status: Home / Self Care | Payer: Medicare Other | Attending: Family Medicine | Admitting: Family Medicine

## 2017-08-18 ENCOUNTER — Other Ambulatory Visit: Payer: Self-pay

## 2017-08-18 ENCOUNTER — Ambulatory Visit (HOSPITAL_COMMUNITY)
Admission: RE | Admit: 2017-08-18 | Discharge: 2017-08-18 | Disposition: A | Discharge status: Home / Self Care | Payer: Medicare Other | Source: Ambulatory Visit | Attending: Pulmonary Disease | Admitting: Pulmonary Disease

## 2017-08-18 DIAGNOSIS — C771 Secondary and unspecified malignant neoplasm of intrathoracic lymph nodes: Secondary | ICD-10-CM | POA: Insufficient documentation

## 2017-08-18 DIAGNOSIS — F332 Major depressive disorder, recurrent severe without psychotic features: Secondary | ICD-10-CM | POA: Diagnosis present

## 2017-08-18 DIAGNOSIS — J984 Other disorders of lung: Secondary | ICD-10-CM | POA: Diagnosis not present

## 2017-08-18 DIAGNOSIS — R0902 Hypoxemia: Secondary | ICD-10-CM | POA: Diagnosis not present

## 2017-08-18 DIAGNOSIS — C3411 Malignant neoplasm of upper lobe, right bronchus or lung: Principal | ICD-10-CM | POA: Diagnosis present

## 2017-08-18 DIAGNOSIS — J9601 Acute respiratory failure with hypoxia: Secondary | ICD-10-CM | POA: Diagnosis not present

## 2017-08-18 DIAGNOSIS — Z515 Encounter for palliative care: Secondary | ICD-10-CM | POA: Diagnosis not present

## 2017-08-18 DIAGNOSIS — N183 Chronic kidney disease, stage 3 unspecified: Secondary | ICD-10-CM | POA: Diagnosis present

## 2017-08-18 DIAGNOSIS — F419 Anxiety disorder, unspecified: Secondary | ICD-10-CM | POA: Diagnosis present

## 2017-08-18 DIAGNOSIS — Z9889 Other specified postprocedural states: Secondary | ICD-10-CM

## 2017-08-18 DIAGNOSIS — Z66 Do not resuscitate: Secondary | ICD-10-CM | POA: Diagnosis present

## 2017-08-18 DIAGNOSIS — Z87891 Personal history of nicotine dependence: Secondary | ICD-10-CM

## 2017-08-18 DIAGNOSIS — D75839 Thrombocytosis, unspecified: Secondary | ICD-10-CM | POA: Diagnosis present

## 2017-08-18 DIAGNOSIS — K5900 Constipation, unspecified: Secondary | ICD-10-CM

## 2017-08-18 DIAGNOSIS — I129 Hypertensive chronic kidney disease with stage 1 through stage 4 chronic kidney disease, or unspecified chronic kidney disease: Secondary | ICD-10-CM | POA: Diagnosis present

## 2017-08-18 DIAGNOSIS — E039 Hypothyroidism, unspecified: Secondary | ICD-10-CM | POA: Diagnosis present

## 2017-08-18 DIAGNOSIS — C801 Malignant (primary) neoplasm, unspecified: Secondary | ICD-10-CM | POA: Diagnosis not present

## 2017-08-18 DIAGNOSIS — C7951 Secondary malignant neoplasm of bone: Secondary | ICD-10-CM | POA: Diagnosis present

## 2017-08-18 DIAGNOSIS — R402441 Other coma, without documented Glasgow coma scale score, or with partial score reported, in the field [EMT or ambulance]: Secondary | ICD-10-CM | POA: Diagnosis not present

## 2017-08-18 DIAGNOSIS — E43 Unspecified severe protein-calorie malnutrition: Secondary | ICD-10-CM | POA: Diagnosis not present

## 2017-08-18 DIAGNOSIS — Z6823 Body mass index (BMI) 23.0-23.9, adult: Secondary | ICD-10-CM

## 2017-08-18 DIAGNOSIS — C3490 Malignant neoplasm of unspecified part of unspecified bronchus or lung: Secondary | ICD-10-CM

## 2017-08-18 DIAGNOSIS — Z8249 Family history of ischemic heart disease and other diseases of the circulatory system: Secondary | ICD-10-CM

## 2017-08-18 DIAGNOSIS — K219 Gastro-esophageal reflux disease without esophagitis: Secondary | ICD-10-CM | POA: Diagnosis present

## 2017-08-18 DIAGNOSIS — T402X5A Adverse effect of other opioids, initial encounter: Secondary | ICD-10-CM | POA: Diagnosis present

## 2017-08-18 DIAGNOSIS — D649 Anemia, unspecified: Secondary | ICD-10-CM | POA: Diagnosis present

## 2017-08-18 DIAGNOSIS — Z79891 Long term (current) use of opiate analgesic: Secondary | ICD-10-CM

## 2017-08-18 DIAGNOSIS — J90 Pleural effusion, not elsewhere classified: Secondary | ICD-10-CM | POA: Diagnosis not present

## 2017-08-18 DIAGNOSIS — R918 Other nonspecific abnormal finding of lung field: Secondary | ICD-10-CM | POA: Diagnosis not present

## 2017-08-18 DIAGNOSIS — R0602 Shortness of breath: Secondary | ICD-10-CM

## 2017-08-18 DIAGNOSIS — R9401 Abnormal electroencephalogram [EEG]: Secondary | ICD-10-CM | POA: Diagnosis present

## 2017-08-18 DIAGNOSIS — E875 Hyperkalemia: Secondary | ICD-10-CM | POA: Diagnosis present

## 2017-08-18 DIAGNOSIS — D473 Essential (hemorrhagic) thrombocythemia: Secondary | ICD-10-CM | POA: Diagnosis present

## 2017-08-18 DIAGNOSIS — F329 Major depressive disorder, single episode, unspecified: Secondary | ICD-10-CM | POA: Diagnosis present

## 2017-08-18 DIAGNOSIS — Z823 Family history of stroke: Secondary | ICD-10-CM

## 2017-08-18 DIAGNOSIS — G9340 Encephalopathy, unspecified: Secondary | ICD-10-CM | POA: Diagnosis present

## 2017-08-18 DIAGNOSIS — J910 Malignant pleural effusion: Secondary | ICD-10-CM | POA: Diagnosis present

## 2017-08-18 DIAGNOSIS — C7972 Secondary malignant neoplasm of left adrenal gland: Secondary | ICD-10-CM

## 2017-08-18 DIAGNOSIS — D72829 Elevated white blood cell count, unspecified: Secondary | ICD-10-CM | POA: Diagnosis present

## 2017-08-18 DIAGNOSIS — G92 Toxic encephalopathy: Secondary | ICD-10-CM | POA: Diagnosis present

## 2017-08-18 DIAGNOSIS — R64 Cachexia: Secondary | ICD-10-CM | POA: Diagnosis present

## 2017-08-18 DIAGNOSIS — R0603 Acute respiratory distress: Secondary | ICD-10-CM

## 2017-08-18 DIAGNOSIS — Z7982 Long term (current) use of aspirin: Secondary | ICD-10-CM

## 2017-08-18 LAB — I-STAT CHEM 8, ED
BUN: 35 mg/dL — AB (ref 6–20)
CHLORIDE: 96 mmol/L — AB (ref 101–111)
Calcium, Ion: 1.36 mmol/L (ref 1.15–1.40)
Creatinine, Ser: 1.3 mg/dL — ABNORMAL HIGH (ref 0.61–1.24)
Glucose, Bld: 108 mg/dL — ABNORMAL HIGH (ref 65–99)
HEMATOCRIT: 39 % (ref 39.0–52.0)
Hemoglobin: 13.3 g/dL (ref 13.0–17.0)
POTASSIUM: 5.6 mmol/L — AB (ref 3.5–5.1)
SODIUM: 132 mmol/L — AB (ref 135–145)
TCO2: 31 mmol/L (ref 22–32)

## 2017-08-18 LAB — GLUCOSE, CAPILLARY: Glucose-Capillary: 103 mg/dL — ABNORMAL HIGH (ref 65–99)

## 2017-08-18 LAB — I-STAT TROPONIN, ED: Troponin i, poc: 0 ng/mL (ref 0.00–0.08)

## 2017-08-18 MED ORDER — FLUDEOXYGLUCOSE F - 18 (FDG) INJECTION
7.9000 | Freq: Once | INTRAVENOUS | Status: AC | PRN
  Administered 2017-08-18: 7.9 via INTRAVENOUS

## 2017-08-18 NOTE — ED Triage Notes (Signed)
Pt brought in by EMS from home  The past couple of weeks he was diagnosed with a tumor in his lung and has had fluid removed from his right lung  Pt has had increased pain and decreased urine output  Pt has not been acting like himself  Pupils pinpoint  Pt recently had changes in his pain medication   Pt has history of alcohol abuse

## 2017-08-18 NOTE — Progress Notes (Signed)
Venous blood gas analysis completed. Downtime form used. Panic result given to [employer] PA-C

## 2017-08-18 NOTE — ED Provider Notes (Signed)
[facility] DEPT Provider Note   CSN: [id number] Arrival date & time: 08/18/17  2212    History   Chief Complaint Chief Complaint  Patient presents with  . Shortness of Breath  . Altered Mental Status    HPI Peter Velasquez is a 84 y.o. male.  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  He was transported from home by EMS.  Story is unclear at this time as no family is present.  EMS reports that patient has not been acting like himself recently.  He did have his daily morphine dose increased 2 days ago from every 8 hours to every 6 hours.  Patient does report chest pain which he states has been chronic over the past few months.  This is worse with deep breathing.  No reported fevers recently.  Level 5 caveat secondary to altered mental status  The history is provided by the patient. No language interpreter was used.  Shortness of Breath   Altered Mental Status      Past Medical History:  Diagnosis Date  . BENIGN PROSTATIC HYPERTROPHY 01/13/2007   Qualifier: Diagnosis of  By: Jenny Reichmann MD, Hunt Oris   . COPD 01/09/2007   Qualifier: Diagnosis of  By: Elveria Royals   . DEGENERATIVE JOINT DISEASE 01/09/2007   Qualifier: Diagnosis of  By: Elveria Royals   . DEPRESSION 01/09/2007   Qualifier: Diagnosis of  By: Elveria Royals   . DIVERTICULOSIS, COLON 01/09/2007   Qualifier: Diagnosis of  By: Elveria Royals   . ERECTILE DYSFUNCTION 01/09/2007   Qualifier: Diagnosis of  By: Elveria Royals   . GERD 01/09/2007   Qualifier: Diagnosis of  By: Elveria Royals   . HYPERLIPIDEMIA 01/09/2007   Qualifier: Diagnosis of  By: Elveria Royals   . HYPOTHYROIDISM 01/09/2007   Qualifier: Diagnosis of  By: Elveria Royals   . PALPITATIONS, HX  OF 01/09/2007   Qualifier: Diagnosis of  By: Elveria Royals   . TINNITUS 01/09/2007   Qualifier: Diagnosis of  By: Elveria Royals     Patient Active Problem List   Diagnosis Date Noted  . Protein-calorie malnutrition, severe 08/12/2017  . Malignant pleural effusion 08/11/2017  . AKI (acute kidney injury) ([place]) 08/11/2017  . Normocytic normochromic anemia 08/11/2017  . Metastasis ([place]) 08/11/2017  . Suicide attempt by drug ingestion ([place])   . MDD (major depressive disorder), recurrent severe, without psychosis ([place]) 04/29/2017  . Adjustment disorder with mixed disturbance of emotions and conduct 04/24/2017  . Suicide attempt ([place]) 04/23/2017  . Overdose of acetaminophen 04/23/2017  . Overdose 04/23/2017  . BENIGN PROSTATIC HYPERTROPHY 01/13/2007  . Hypothyroidism 01/09/2007  . HYPERLIPIDEMIA 01/09/2007  . ERECTILE DYSFUNCTION 01/09/2007  . DEPRESSION 01/09/2007  . TINNITUS 01/09/2007  . COPD 01/09/2007  . GERD 01/09/2007  . DIVERTICULOSIS, COLON 01/09/2007  . Osteoarthritis 01/09/2007  . PALPITATIONS, HX OF 01/09/2007    No past surgical history on file.      Home Medications    Prior to Admission medications   Medication Sig Start Date End Date Taking? Authorizing Provider  aspirin EC 81 MG tablet Take 81 mg by mouth at bedtime.    Yes [provider]  atenolol (TENORMIN) 50 MG tablet Take 50 mg by mouth daily.   Yes [provider]  citalopram (CELEXA) 20 MG tablet Take 20 mg by mouth daily.   Yes [provider]  levothyroxine (SYNTHROID, LEVOTHROID) 125 MCG tablet Take 1 tablet by mouth daily. 07/05/17  Yes [provider]  morphine (MS CONTIN) 15 MG 12 hr tablet Take 1 tablet (15 mg total) by mouth every 12 (twelve) hours. 08/14/17  Yes Charlynne Cousins, MD  omeprazole (PRILOSEC) 20 MG capsule Take 20 mg by mouth daily.   Yes [provider]  oxyCODONE-acetaminophen (PERCOCET/ROXICET) 5-325 MG tablet Take 2  tablets by mouth every 4 (four) hours as needed for moderate pain. 08/14/17  Yes Charlynne Cousins, MD  simvastatin (ZOCOR) 40 MG tablet Take 40 mg by mouth daily.   Yes [provider]    Family History Family History  Problem Relation Age of Onset  . Stroke Father   . Hypertension Father   . Other Sister     Social History Social History   Tobacco Use  . Smoking status: Former Smoker    Last attempt to quit: 01/23/1998    Years since quitting: 19.5  . Smokeless tobacco: Never Used  Substance Use Topics  . Alcohol use: Yes    Comment: daily use  . Drug use: No     Allergies   Morphine and related   Review of Systems Review of Systems  Unable to perform ROS: Mental status change  Respiratory: Positive for shortness of breath.      Physical Exam Updated Vital Signs BP (!) 119/47   Pulse 81   Temp 98.2 F (36.8 C) (Oral)   Resp (!) 22   SpO2 97%   Physical Exam  Constitutional: He is oriented to person, place, and time. He appears well-developed and well-nourished. No distress.  Cachectic, chronically ill-appearing  HENT:  Head: Normocephalic and atraumatic.  Eyes: Conjunctivae and EOM are normal. No scleral icterus.  Neck: Normal range of motion.  Cardiovascular: Normal rate, regular rhythm and intact distal pulses.  Pulmonary/Chest: Effort normal. No stridor. No respiratory distress. He has no wheezes.  Oxygen saturations 99% on 2 L via nasal cannula.  No history of chronic oxygen requirement.  No tachypnea or dyspnea.  Lung sounds are grossly clear bilaterally.  Abdominal: Soft. He exhibits no distension. There is no guarding.  Soft abdomen without palpable masses, distention, guarding  Musculoskeletal: Normal range of motion.  Neurological: He is alert and oriented to person, place, and time. He exhibits normal muscle tone. Coordination normal.  Skin: Skin is warm and dry. No rash noted. He is not diaphoretic. No erythema. No pallor.    Psychiatric: He has a normal mood and affect. His behavior is normal.  Nursing note and vitals reviewed.    ED Treatments / Results  Labs (all labs ordered are listed, but only abnormal results are displayed) Labs Reviewed  CBC WITH DIFFERENTIAL/PLATELET - Abnormal; Notable for the following components:      Result Value   WBC 36.4 (*)    RBC 3.95 (*)    Hemoglobin 11.3 (*)    HCT 35.7 (*)    Platelets 645 (*)    Neutro Abs 32.0 (*)    Monocytes Absolute 2.5 (*)    All other components within normal limits  COMPREHENSIVE METABOLIC PANEL - Abnormal; Notable for the following components:   Sodium 134 (*)    Potassium 5.7 (*)    Chloride 95 (*)    Glucose, Bld 105 (*)    BUN 25 (*)  Creatinine, Ser 1.45 (*)    Calcium 11.0 (*)    Albumin 2.7 (*)    Alkaline Phosphatase 148 (*)    Total Bilirubin 2.1 (*)    GFR calc non Af Amer 44 (*)    GFR calc Af Amer 51 (*)    All other components within normal limits  RAPID URINE DRUG SCREEN, HOSP PERFORMED - Abnormal; Notable for the following components:   Opiates POSITIVE (*)    All other components within normal limits  URINALYSIS, ROUTINE W REFLEX MICROSCOPIC - Abnormal; Notable for the following components:   Ketones, ur 5 (*)    All other components within normal limits  AMMONIA - Abnormal; Notable for the following components:   Ammonia <9 (*)    All other components within normal limits  I-STAT CHEM 8, ED - Abnormal; Notable for the following components:   Sodium 132 (*)    Potassium 5.6 (*)    Chloride 96 (*)    BUN 35 (*)    Creatinine, Ser 1.30 (*)    Glucose, Bld 108 (*)    All other components within normal limits  ETHANOL  BLOOD GAS, VENOUS  POTASSIUM  I-STAT TROPONIN, ED    EKG EKG Interpretation  Date/Time:  Tuesday August 18 2017 23:02:15 EDT Ventricular Rate:  77 PR Interval:    QRS Duration: 102 QT Interval:  378 QTC Calculation: 428 R Axis:   18 Text Interpretation:  Sinus rhythm Probable left  atrial enlargement No significant change was found Confirmed by Molpus, John [id number]) on 08/19/2017 4:22:51 AM   Radiology Nm Pet Image Initial (pi) Skull Base To Thigh  Result Date: 08/18/2017 CLINICAL DATA:  Initial treatment strategy for right upper lobe mass. EXAM: NUCLEAR MEDICINE PET SKULL BASE TO THIGH TECHNIQUE: 7.9 mCi F-18 FDG was injected intravenously. Full-ring PET imaging was performed from the skull base to thigh after the radiotracer. CT data was obtained and used for attenuation correction and anatomic localization. Fasting blood glucose: 103 mg/dl COMPARISON:  CT abdomen/pelvis dated 08/11/2017. CT chest dated 08/10/2017. FINDINGS: Mediastinal blood pool activity: SUV max 2.2 NECK: No hypermetabolic cervical lymphadenopathy. Incidental CT findings: none CHEST: 4.3 cm posterior right upper lobe mass, max SUV 17.5, corresponding to suspected primary bronchogenic neoplasm. Additional pleural-based nodularity at the right lung apex, max SUV 6.2 (PET image 56). Thoracic nodal metastases, including: --1.2 cm short axis right paratracheal node (series 4/image 35), max SUV 20.9 --1.5 cm short axis right hilar node (series 4/image 39), max SUV 21.7 Moderate right pleural effusion. Associated right lower lobe compressive atelectasis. Trace left pleural effusion. Incidental CT findings: Atherosclerotic calcifications of the aortic arch. Coronary atherosclerosis of the LAD. ABDOMEN/PELVIS: At least 4 hepatic metastases, including: --Lesion in segment 4A (PET image 102), max SUV 11.9 --Lesion in segment 6 (PET image 115), max SUV 11.7 --Lesion in segment 3 (PET image 119), max SUV 5.9 12 mm left adrenal metastasis (series 4/image 130), max SUV 8.4. No hypermetabolic lymphadenopathy in the abdomen/pelvis. Incidental CT findings: 7.2 cm left upper pole renal cyst. Atherosclerotic calcifications the abdominal aorta and branch vessels. Prostatomegaly. Penile prosthesis. SKELETON: Widespread osseous metastases  throughout the visualized axial and appendicular skeleton, including following representative lesions: --Lower cervical vertebral body, max SUV 42.4 --Left scapula, max SUV 19.1 --Lower thoracic vertebral body, max SUV 48.7 --Mid lumbar vertebral body, max SUV 26.8 --Left iliac bone, max SUV 36.5 --Right proximal femur, max SUV 29.8 Incidental CT findings: none IMPRESSION: 4.3 cm right upper lobe mass,  corresponding to suspected primary bronchogenic neoplasm. Associated mediastinal and right hilar nodal metastases. Moderate right pleural effusion.  Trace left pleural effusion. Hepatic and left adrenal metastases. Widespread osseous metastases throughout the visualized axial and appendicular skeleton, including representative lesions above. Electronically Signed   By: Julian Hy M.D.   On: 08/18/2017 13:51   Dg Chest Port 1 View  Result Date: 08/18/2017 CLINICAL DATA:  Initial evaluation for acute shortness of breath. EXAM: PORTABLE CHEST 1 VIEW COMPARISON:  Comparison made with prior PET-CT from earlier the same day. FINDINGS: Mild cardiomegaly, stable. Mediastinal silhouette within normal limits. Aortic atherosclerosis. Lungs hypoinflated. Veiling opacity overlying the right lung consistent with pleural effusion. Underlying changes related to COPD. Focal approximate 4 cm opacity within the right suprahilar region consistent with patient's known right upper lobe mass. Superimposed bibasilar atelectatic changes. Trace left pleural effusion. No pneumothorax. No other definite focal air space disease. Overall, appearance is similar to previous. Osseous structures are unchanged. IMPRESSION: 1. Moderate right with trace left pleural effusion with associated bibasilar atelectatic changes. 2. Approximate 4 cm right suprahilar opacity, consistent with patient's known right upper lobe mass. 3. Underlying COPD. 4. Mild cardiomegaly with aortic atherosclerosis. Electronically Signed   By: Jeannine Boga M.D.    On: 08/18/2017 22:55   Dg Abd 2 Views  Result Date: 08/19/2017 CLINICAL DATA:  Chronic constipation. EXAM: ABDOMEN - 2 VIEW COMPARISON:  PET/CT performed 08/18/2017 FINDINGS: A small right pleural effusion is again noted. No free intra-abdominal air is seen on the provided decubitus view. Residual contrast is seen within the colon. The colon is partially filled with stool, without significant radiographic evidence for constipation. Mild degenerative change is noted at the lower lumbar spine. IMPRESSION: 1. Small to moderate amount of stool noted in the colon. No radiographic evidence for constipation. No free intra-abdominal air seen. 2. Small right pleural effusion noted. Electronically Signed   By: Garald Balding M.D.   On: 08/19/2017 00:41    Procedures Procedures (including critical care time)  Medications Ordered in ED Medications  sodium chloride 0.9 % bolus 1,000 mL (0 mLs Intravenous Stopped 08/19/17 0223)     Initial Impression / Assessment and Plan / ED Course  I have reviewed the triage vital signs and the nursing notes.  Pertinent labs & imaging results that were available during my care of the patient were reviewed by me and considered in my medical decision making (see chart for details).     12:00 AM No family has presented to the emergency department.  I did reach out and call the patient's wife and daughter.  I spoke with them and they expressed concern over persistent labored breathing.  The patient had some fluid drawn off his right lung a few days ago and they are concerned this may have returned.  They also state that the patient has been more incoherent.  They do attribute this to increase in his daily morphine.  Daughter would also appreciate the patient's liver and kidney function being checked.  He has not had a bowel movement in 2 weeks, but has not had any vomiting.  Daughter did state, however, that he had some nausea at home and "spit up" a little.  3:00  AM Patient with leukocytosis of 36.4.  He does not meet SIRS or sepsis criteria as he has had no increased heart rate, hypotension, increased respiratory rate, fever.  Also with anemia which is stable, likely secondary to chronic disease.  Repeat potassium pending.  There  is evidence of mild AK I, but patient has been hydrated in the emergency department.  There is some nonspecific changes to alk phos and total bilirubin.  Patient has been hemodynamically stable since arrival.  He has no history of chronic oxygen requirement and has been on 2 L via nasal cannula, satting well.  Despite leukocytosis and slight changes to labs, will attempt to pursue outpatient management and follow-up.  Will monitor patient off oxygen to see if he decompensates.  4:18 AM Patient taken off oxygen with sats dropping to 88%.  Placed back on 2 L nasal cannula with improvement oxygen saturations.  Given desaturation at rest with subjective worsening of pleural effusion on imaging, will plan for admission.  Patient would likely benefit from Palliative care consultation during this admission, if not already pursued.  4:46 AM Case discussed with Dr. Myna Hidalgo who will admit.    Final Clinical Impressions(s) / ED Diagnoses   Final diagnoses:  Constipation  Shortness of breath  Pleural effusion, right  Hypoxia    ED Discharge Orders    None       Antonietta Breach, PA-C 08/19/17 0446    Shanon Rosser, MD 08/19/17 [phone]

## 2017-08-18 NOTE — ED Notes (Signed)
Bed: LG92 Expected date:  Expected time:  Means of arrival:  Comments: Hold for Res A

## 2017-08-19 ENCOUNTER — Observation Stay (HOSPITAL_COMMUNITY): Payer: Medicare Other

## 2017-08-19 ENCOUNTER — Encounter (HOSPITAL_COMMUNITY): Payer: Self-pay | Admitting: Family Medicine

## 2017-08-19 ENCOUNTER — Other Ambulatory Visit: Payer: Self-pay

## 2017-08-19 ENCOUNTER — Encounter (HOSPITAL_COMMUNITY): Payer: Medicare Other

## 2017-08-19 ENCOUNTER — Emergency Department (HOSPITAL_COMMUNITY): Payer: Medicare Other

## 2017-08-19 DIAGNOSIS — C7951 Secondary malignant neoplasm of bone: Secondary | ICD-10-CM

## 2017-08-19 DIAGNOSIS — R918 Other nonspecific abnormal finding of lung field: Secondary | ICD-10-CM | POA: Diagnosis not present

## 2017-08-19 DIAGNOSIS — R4020 Unspecified coma: Secondary | ICD-10-CM | POA: Diagnosis not present

## 2017-08-19 DIAGNOSIS — N183 Chronic kidney disease, stage 3 unspecified: Secondary | ICD-10-CM | POA: Diagnosis present

## 2017-08-19 DIAGNOSIS — E875 Hyperkalemia: Secondary | ICD-10-CM

## 2017-08-19 DIAGNOSIS — N182 Chronic kidney disease, stage 2 (mild): Secondary | ICD-10-CM

## 2017-08-19 DIAGNOSIS — G9340 Encephalopathy, unspecified: Secondary | ICD-10-CM | POA: Diagnosis not present

## 2017-08-19 DIAGNOSIS — K5900 Constipation, unspecified: Secondary | ICD-10-CM | POA: Diagnosis not present

## 2017-08-19 DIAGNOSIS — D473 Essential (hemorrhagic) thrombocythemia: Secondary | ICD-10-CM | POA: Diagnosis present

## 2017-08-19 DIAGNOSIS — F332 Major depressive disorder, recurrent severe without psychotic features: Secondary | ICD-10-CM | POA: Diagnosis not present

## 2017-08-19 DIAGNOSIS — C801 Malignant (primary) neoplasm, unspecified: Secondary | ICD-10-CM | POA: Diagnosis not present

## 2017-08-19 DIAGNOSIS — J90 Pleural effusion, not elsewhere classified: Secondary | ICD-10-CM

## 2017-08-19 DIAGNOSIS — J9601 Acute respiratory failure with hypoxia: Secondary | ICD-10-CM | POA: Diagnosis present

## 2017-08-19 DIAGNOSIS — D75839 Thrombocytosis, unspecified: Secondary | ICD-10-CM | POA: Diagnosis present

## 2017-08-19 DIAGNOSIS — J910 Malignant pleural effusion: Secondary | ICD-10-CM | POA: Diagnosis not present

## 2017-08-19 DIAGNOSIS — R0603 Acute respiratory distress: Secondary | ICD-10-CM

## 2017-08-19 LAB — URINALYSIS, ROUTINE W REFLEX MICROSCOPIC
Bilirubin Urine: NEGATIVE
Glucose, UA: NEGATIVE mg/dL
Hgb urine dipstick: NEGATIVE
Ketones, ur: 5 mg/dL — AB
Leukocytes, UA: NEGATIVE
NITRITE: NEGATIVE
PH: 5 (ref 5.0–8.0)
Protein, ur: NEGATIVE mg/dL
SPECIFIC GRAVITY, URINE: 1.024 (ref 1.005–1.030)

## 2017-08-19 LAB — CBC WITH DIFFERENTIAL/PLATELET
BASOS PCT: 0 %
Basophils Absolute: 0 10*3/uL (ref 0.0–0.1)
EOS ABS: 0.4 10*3/uL (ref 0.0–0.7)
EOS PCT: 1 %
HCT: 35.7 % — ABNORMAL LOW (ref 39.0–52.0)
Hemoglobin: 11.3 g/dL — ABNORMAL LOW (ref 13.0–17.0)
LYMPHS ABS: 1.5 10*3/uL (ref 0.7–4.0)
Lymphocytes Relative: 4 %
MCH: 28.6 pg (ref 26.0–34.0)
MCHC: 31.7 g/dL (ref 30.0–36.0)
MCV: 90.4 fL (ref 78.0–100.0)
MONO ABS: 2.5 10*3/uL — AB (ref 0.1–1.0)
Monocytes Relative: 7 %
NEUTROS PCT: 88 %
Neutro Abs: 32 10*3/uL — ABNORMAL HIGH (ref 1.7–7.7)
PLATELETS: 645 10*3/uL — AB (ref 150–400)
RBC: 3.95 MIL/uL — AB (ref 4.22–5.81)
RDW: 14.2 % (ref 11.5–15.5)
WBC: 36.4 10*3/uL — AB (ref 4.0–10.5)

## 2017-08-19 LAB — COMPREHENSIVE METABOLIC PANEL
ALT: 23 U/L (ref 17–63)
ANION GAP: 13 (ref 5–15)
AST: 34 U/L (ref 15–41)
Albumin: 2.7 g/dL — ABNORMAL LOW (ref 3.5–5.0)
Alkaline Phosphatase: 148 U/L — ABNORMAL HIGH (ref 38–126)
BUN: 25 mg/dL — ABNORMAL HIGH (ref 6–20)
CHLORIDE: 95 mmol/L — AB (ref 101–111)
CO2: 26 mmol/L (ref 22–32)
Calcium: 11 mg/dL — ABNORMAL HIGH (ref 8.9–10.3)
Creatinine, Ser: 1.45 mg/dL — ABNORMAL HIGH (ref 0.61–1.24)
GFR, EST AFRICAN AMERICAN: 51 mL/min — AB (ref >60)
GFR, EST NON AFRICAN AMERICAN: 44 mL/min — AB (ref >60)
Glucose, Bld: 105 mg/dL — ABNORMAL HIGH (ref 65–99)
POTASSIUM: 5.7 mmol/L — AB (ref 3.5–5.1)
SODIUM: 134 mmol/L — AB (ref 135–145)
Total Bilirubin: 2.1 mg/dL — ABNORMAL HIGH (ref 0.3–1.2)
Total Protein: 6.6 g/dL (ref 6.5–8.1)

## 2017-08-19 LAB — TSH: TSH: 5.161 u[IU]/mL — ABNORMAL HIGH (ref 0.350–4.500)

## 2017-08-19 LAB — BILIRUBIN, FRACTIONATED(TOT/DIR/INDIR)
BILIRUBIN DIRECT: 0.2 mg/dL (ref 0.1–0.5)
BILIRUBIN TOTAL: 0.5 mg/dL (ref 0.3–1.2)
Indirect Bilirubin: 0.3 mg/dL (ref 0.3–0.9)

## 2017-08-19 LAB — BODY FLUID CELL COUNT WITH DIFFERENTIAL
EOS FL: 3 %
Lymphs, Fluid: 27 %
Monocyte-Macrophage-Serous Fluid: 36 % — ABNORMAL LOW (ref 50–90)
NEUTROPHIL FLUID: 34 % — AB (ref 0–25)
Total Nucleated Cell Count, Fluid: 1301 cu mm — ABNORMAL HIGH (ref 0–1000)

## 2017-08-19 LAB — POTASSIUM
POTASSIUM: 4.9 mmol/L (ref 3.5–5.1)
POTASSIUM: 4.5 mmol/L (ref 3.5–5.1)

## 2017-08-19 LAB — ETHANOL

## 2017-08-19 LAB — RAPID URINE DRUG SCREEN, HOSP PERFORMED
Amphetamines: NOT DETECTED
BENZODIAZEPINES: NOT DETECTED
Barbiturates: NOT DETECTED
Cocaine: NOT DETECTED
OPIATES: POSITIVE — AB
Tetrahydrocannabinol: NOT DETECTED

## 2017-08-19 LAB — AMMONIA

## 2017-08-19 LAB — VITAMIN B12: Vitamin B-12: 1483 pg/mL — ABNORMAL HIGH (ref 180–914)

## 2017-08-19 MED ORDER — SIMVASTATIN 40 MG PO TABS
40.0000 mg | ORAL_TABLET | Freq: Every day | ORAL | Status: DC
  Administered 2017-08-19: 40 mg via ORAL
  Filled 2017-08-19: qty 1

## 2017-08-19 MED ORDER — OXYCODONE-ACETAMINOPHEN 5-325 MG PO TABS
2.0000 | ORAL_TABLET | ORAL | Status: DC | PRN
  Administered 2017-08-19: 2 via ORAL
  Filled 2017-08-19 (×2): qty 2

## 2017-08-19 MED ORDER — PANTOPRAZOLE SODIUM 40 MG PO TBEC
40.0000 mg | DELAYED_RELEASE_TABLET | Freq: Every day | ORAL | Status: DC
  Administered 2017-08-19 – 2017-08-20 (×2): 40 mg via ORAL
  Filled 2017-08-19 (×2): qty 1

## 2017-08-19 MED ORDER — CITALOPRAM HYDROBROMIDE 10 MG PO TABS
20.0000 mg | ORAL_TABLET | Freq: Every day | ORAL | Status: DC
  Filled 2017-08-19: qty 2

## 2017-08-19 MED ORDER — ENOXAPARIN SODIUM 40 MG/0.4ML ~~LOC~~ SOLN
40.0000 mg | SUBCUTANEOUS | Status: DC
  Administered 2017-08-20: 40 mg via SUBCUTANEOUS
  Filled 2017-08-19: qty 0.4

## 2017-08-19 MED ORDER — LEVOTHYROXINE SODIUM 125 MCG PO TABS
125.0000 ug | ORAL_TABLET | Freq: Every day | ORAL | Status: DC
  Administered 2017-08-19 – 2017-08-20 (×2): 125 ug via ORAL
  Filled 2017-08-19 (×2): qty 1

## 2017-08-19 MED ORDER — CITALOPRAM HYDROBROMIDE 20 MG PO TABS
20.0000 mg | ORAL_TABLET | Freq: Every day | ORAL | Status: DC
  Administered 2017-08-19 – 2017-08-20 (×2): 20 mg via ORAL
  Filled 2017-08-19 (×2): qty 1

## 2017-08-19 MED ORDER — SODIUM CHLORIDE 0.9 % IV BOLUS
1000.0000 mL | Freq: Once | INTRAVENOUS | Status: AC
  Administered 2017-08-19: 1000 mL via INTRAVENOUS

## 2017-08-19 MED ORDER — LORAZEPAM 2 MG/ML IJ SOLN
0.5000 mg | INTRAMUSCULAR | Status: DC | PRN
  Administered 2017-08-19 – 2017-08-20 (×4): 0.5 mg via INTRAVENOUS
  Filled 2017-08-19 (×4): qty 1

## 2017-08-19 MED ORDER — SENNOSIDES-DOCUSATE SODIUM 8.6-50 MG PO TABS: 1.0000 | ORAL_TABLET | Freq: Every evening | ORAL | Status: DC | PRN

## 2017-08-19 MED ORDER — ONDANSETRON HCL 4 MG PO TABS: 4.0000 mg | ORAL_TABLET | Freq: Four times a day (QID) | ORAL | Status: DC | PRN

## 2017-08-19 MED ORDER — ASPIRIN EC 81 MG PO TBEC
81.0000 mg | DELAYED_RELEASE_TABLET | Freq: Every day | ORAL | Status: DC
  Filled 2017-08-19: qty 1

## 2017-08-19 MED ORDER — ATENOLOL 25 MG PO TABS
50.0000 mg | ORAL_TABLET | Freq: Every day | ORAL | Status: DC
Start: 2017-08-19 — End: 2017-08-21
  Administered 2017-08-19 – 2017-08-20 (×2): 50 mg via ORAL
  Filled 2017-08-19 (×2): qty 2

## 2017-08-19 MED ORDER — LIDOCAINE HCL 1 % IJ SOLN
INTRAMUSCULAR | Status: AC
  Filled 2017-08-19: qty 10

## 2017-08-19 MED ORDER — ACETAMINOPHEN 325 MG PO TABS: 650.0000 mg | ORAL_TABLET | Freq: Four times a day (QID) | ORAL | Status: DC | PRN

## 2017-08-19 MED ORDER — SODIUM CHLORIDE 0.9% FLUSH
3.0000 mL | Freq: Two times a day (BID) | INTRAVENOUS | Status: DC
  Administered 2017-08-19: 3 mL via INTRAVENOUS

## 2017-08-19 MED ORDER — SODIUM CHLORIDE 0.9 % IV BOLUS
500.0000 mL | Freq: Once | INTRAVENOUS | Status: AC
  Administered 2017-08-20: 500 mL via INTRAVENOUS

## 2017-08-19 MED ORDER — SODIUM CHLORIDE 0.9 % IV SOLN
INTRAVENOUS | Status: AC
  Administered 2017-08-19: 09:00:00 via INTRAVENOUS

## 2017-08-19 MED ORDER — ACETAMINOPHEN 650 MG RE SUPP: 650.0000 mg | Freq: Four times a day (QID) | RECTAL | Status: DC | PRN

## 2017-08-19 MED ORDER — ONDANSETRON HCL 4 MG/2ML IJ SOLN: 4.0000 mg | Freq: Four times a day (QID) | INTRAMUSCULAR | Status: DC | PRN

## 2017-08-19 MED ORDER — BISACODYL 5 MG PO TBEC
5.0000 mg | DELAYED_RELEASE_TABLET | Freq: Every day | ORAL | Status: DC | PRN
  Administered 2017-08-20: 5 mg via ORAL
  Filled 2017-08-19: qty 1

## 2017-08-19 NOTE — Progress Notes (Signed)
Paged MD as patient' daughter requested 2 times and gave MD a number to call them.

## 2017-08-19 NOTE — Procedures (Signed)
Ultrasound-guided diagnostic and therapeutic right thoracentesis performed yielding 1 liter of slightly hazy, yellow fluid. No immediate complications. Follow-up chest x-ray pending.The fluid was sent to the lab for preordered studies.

## 2017-08-19 NOTE — ED Notes (Signed)
Pt O2 dropped to 89% without O2. 2L [place] placed back on pt and O2 rose to 93%

## 2017-08-19 NOTE — Progress Notes (Signed)
PROGRESS NOTE    Peter Velasquez  NAT:[id number] DOB: Sep 22, 1933 DOA: 08/18/2017 PCP: [name], MD   Brief Narrative: Peter Velasquez is a 84 y.o. male with a medical history significant for hypothyroidism, depression, and recent diagnosis of right lung mass with diffuse osseous metastases and right pleural fluid cytology diagnostic of non-small cell carcinoma. He presented secondary to respiratory distress and confusion, found to have recurrent pleural effusion. S/p thoracentesis on 4/17. Also with leukocytosis with no source.   Assessment & Plan:   Principal Problem:   Malignant pleural effusion Active Problems:   GERD   MDD (major depressive disorder), recurrent severe, without psychosis (HCC)   Normocytic normochromic anemia   Bone metastases (HCC)   CKD (chronic kidney disease), stage II   Hyperkalemia   Mass of right lung   Acute respiratory failure with hypoxia (HCC)   Acute encephalopathy   Hyperbilirubinemia   Thrombocytosis (HCC)   Respiratory distress Acute respiratory failure with hypoxia Thought secondary to pleural effusion. S/p thoracentesis today with 1 liter output. -wean to room air as able  Acute encephalopathy Possibly toxic secondary to opiates. Patient has recent increase in dosage. Ammonia within normal limits. TSH slightly elevated. -CT head -will obtain blood/urine culture in setting of leukocytosis  Malignant pleural effusion Non-small cell carcinoma Patient sees Dr. [name] as an outpatient.  Depression Anxiety -Continue Celexa  Hyperkalemia Resolved overnight.  CKD stage III Stable.  Thrombocytosis Likely reactive -repeat CBC  Leukocytosis Afebrile. ?infection. No source -Blood/urine cultures  Hyperbilirubinemia New. Has a history of cholelithiasis. -Follow-up fractionated bilirubin  Hypothyroidism TSH slightly elevated -Continue Synthroid  Essential hypertension On atenolol as an outpatient.. Mildly  uncontrolled -Atenolol   DVT prophylaxis: Lovenox Code Status:   Code Status: DNR Family Communication: Called wife on multiple phone numbers without answer Disposition Plan: Discharge when baseline mental status   Consultants:   None  Procedures:   Thoracentesis (4/17)  Antimicrobials:  None    Subjective: Patient is confused. Unable to obtain good history. Wants to know where his wife is.  Objective: Vitals:   08/19/17 0906 08/19/17 0934 08/19/17 0944 08/19/17 1221  BP: 135/64 (!) 157/76 (!) 142/69   Pulse: 72     Resp: 16     Temp: 98.4 F (36.9 C)     TempSrc: Oral     SpO2:      Weight: 69.4 kg (153 lb)   67.2 kg (148 lb 2.4 oz)  Height: 5' 7.01" (1.702 m)       Intake/Output Summary (Last 24 hours) at 08/19/2017 1417 Last data filed at 08/19/2017 0813 Gross per 24 hour  Intake 1000 ml  Output 150 ml  Net 850 ml   Filed Weights   08/19/17 0906 08/19/17 1221  Weight: 69.4 kg (153 lb) 67.2 kg (148 lb 2.4 oz)    Examination:  General exam: Appears calm and comfortable Respiratory system: Clear to auscultation. Respiratory effort normal. Cardiovascular system: S1 & S2 heard, RRR. No murmurs, rubs, gallops or clicks. Gastrointestinal system: Abdomen is nondistended, soft and nontender. No organomegaly or masses felt. Normal bowel sounds heard. Central nervous system: Alert and oriented to person and time. No focal neurological deficits. Extremities: No edema. No calf tenderness Skin: No cyanosis. No rashes Psychiatry: Judgement and insight appear impaired. Agitated.    Data Reviewed: I have personally reviewed following labs and imaging studies  CBC: Recent Labs  Lab 08/18/17 2241 08/18/17 2312  WBC 36.4*  --   NEUTROABS 32.0*  --  HGB 11.3* 13.3  HCT 35.7* 39.0  MCV 90.4  --   PLT 645*  --    Basic Metabolic Panel: Recent Labs  Lab 08/18/17 2241 08/18/17 2312 08/19/17 0434  NA 134* 132*  --   K 5.7* 5.6* 4.9  CL 95* 96*  --   CO2  26  --   --   GLUCOSE 105* 108*  --   BUN 25* 35*  --   CREATININE 1.45* 1.30*  --   CALCIUM 11.0*  --   --    GFR: Estimated Creatinine Clearance: 41.7 mL/min (A) (by C-G formula based on SCr of 1.3 mg/dL (H)). Liver Function Tests: Recent Labs  Lab 08/18/17 2241  AST 34  ALT 23  ALKPHOS 148*  BILITOT 2.1*  PROT 6.6  ALBUMIN 2.7*   No results for input(s): LIPASE, AMYLASE in the last 168 hours. Recent Labs  Lab 08/19/17 0108  AMMONIA <9*   Coagulation Profile: No results for input(s): INR, PROTIME in the last 168 hours. Cardiac Enzymes: No results for input(s): CKTOTAL, CKMB, CKMBINDEX, TROPONINI in the last 168 hours. BNP (last 3 results) No results for input(s): PROBNP in the last 8760 hours. HbA1C: No results for input(s): HGBA1C in the last 72 hours. CBG: Recent Labs  Lab 08/18/17 0749  GLUCAP 103*   Lipid Profile: No results for input(s): CHOL, HDL, LDLCALC, TRIG, CHOLHDL, LDLDIRECT in the last 72 hours. Thyroid Function Tests: No results for input(s): TSH, T4TOTAL, FREET4, T3FREE, THYROIDAB in the last 72 hours. Anemia Panel: No results for input(s): VITAMINB12, FOLATE, FERRITIN, TIBC, IRON, RETICCTPCT in the last 72 hours. Sepsis Labs: No results for input(s): PROCALCITON, LATICACIDVEN in the last 168 hours.  Recent Results (from the past 240 hour(s))  Body fluid culture     Status: None (Preliminary result)   Collection Time: 08/19/17  9:37 AM  Result Value Ref Range Status   Specimen Description   Final    PLEURAL RIGHT Performed at [facility] [address], [place], [place] [address]    Special Requests   Final    NONE Performed at [facility], [place] [address] [place], [place] [address]    Gram Stain   Final    RARE WBC PRESENT, PREDOMINANTLY MONONUCLEAR NO ORGANISMS SEEN Performed at [facility], [place] [address] [place], [place] [address]    Culture PENDING  Incomplete   Report Status  PENDING  Incomplete         Radiology Studies: Dg Chest 1 View  Result Date: 08/19/2017 CLINICAL DATA:  Post right thoracentesis EXAM: CHEST  1 VIEW COMPARISON:  08/18/2017 FINDINGS: Decreasing right pleural effusion following thoracentesis. No pneumothorax. Improving aeration in the lung bases with decreasing bibasilar atelectasis. Right upper lobe mass again noted. Heart is upper limits normal in size. IMPRESSION: Decreasing right effusion following thoracentesis. No visible pneumothorax. Electronically Signed   By: Rolm Baptise M.D.   On: 08/19/2017 10:25   Nm Pet Image Initial (pi) Skull Base To Thigh  Result Date: 08/18/2017 CLINICAL DATA:  Initial treatment strategy for right upper lobe mass. EXAM: NUCLEAR MEDICINE PET SKULL BASE TO THIGH TECHNIQUE: 7.9 mCi F-18 FDG was injected intravenously. Full-ring PET imaging was performed from the skull base to thigh after the radiotracer. CT data was obtained and used for attenuation correction and anatomic localization. Fasting blood glucose: 103 mg/dl COMPARISON:  CT abdomen/pelvis dated 08/11/2017. CT chest dated 08/10/2017. FINDINGS: Mediastinal blood pool activity: SUV max  2.2 NECK: No hypermetabolic cervical lymphadenopathy. Incidental CT findings: none CHEST: 4.3 cm posterior right upper lobe mass, max SUV 17.5, corresponding to suspected primary bronchogenic neoplasm. Additional pleural-based nodularity at the right lung apex, max SUV 6.2 (PET image 56). Thoracic nodal metastases, including: --1.2 cm short axis right paratracheal node (series 4/image 35), max SUV 20.9 --1.5 cm short axis right hilar node (series 4/image 39), max SUV 21.7 Moderate right pleural effusion. Associated right lower lobe compressive atelectasis. Trace left pleural effusion. Incidental CT findings: Atherosclerotic calcifications of the aortic arch. Coronary atherosclerosis of the LAD. ABDOMEN/PELVIS: At least 4 hepatic metastases, including: --Lesion in segment 4A (PET  image 102), max SUV 11.9 --Lesion in segment 6 (PET image 115), max SUV 11.7 --Lesion in segment 3 (PET image 119), max SUV 5.9 12 mm left adrenal metastasis (series 4/image 130), max SUV 8.4. No hypermetabolic lymphadenopathy in the abdomen/pelvis. Incidental CT findings: 7.2 cm left upper pole renal cyst. Atherosclerotic calcifications the abdominal aorta and branch vessels. Prostatomegaly. Penile prosthesis. SKELETON: Widespread osseous metastases throughout the visualized axial and appendicular skeleton, including following representative lesions: --Lower cervical vertebral body, max SUV 42.4 --Left scapula, max SUV 19.1 --Lower thoracic vertebral body, max SUV 48.7 --Mid lumbar vertebral body, max SUV 26.8 --Left iliac bone, max SUV 36.5 --Right proximal femur, max SUV 29.8 Incidental CT findings: none IMPRESSION: 4.3 cm right upper lobe mass, corresponding to suspected primary bronchogenic neoplasm. Associated mediastinal and right hilar nodal metastases. Moderate right pleural effusion.  Trace left pleural effusion. Hepatic and left adrenal metastases. Widespread osseous metastases throughout the visualized axial and appendicular skeleton, including representative lesions above. Electronically Signed   By: Julian Hy M.D.   On: 08/18/2017 13:51   Dg Chest Port 1 View  Result Date: 08/18/2017 CLINICAL DATA:  Initial evaluation for acute shortness of breath. EXAM: PORTABLE CHEST 1 VIEW COMPARISON:  Comparison made with prior PET-CT from earlier the same day. FINDINGS: Mild cardiomegaly, stable. Mediastinal silhouette within normal limits. Aortic atherosclerosis. Lungs hypoinflated. Veiling opacity overlying the right lung consistent with pleural effusion. Underlying changes related to COPD. Focal approximate 4 cm opacity within the right suprahilar region consistent with patient's known right upper lobe mass. Superimposed bibasilar atelectatic changes. Trace left pleural effusion. No pneumothorax. No  other definite focal air space disease. Overall, appearance is similar to previous. Osseous structures are unchanged. IMPRESSION: 1. Moderate right with trace left pleural effusion with associated bibasilar atelectatic changes. 2. Approximate 4 cm right suprahilar opacity, consistent with patient's known right upper lobe mass. 3. Underlying COPD. 4. Mild cardiomegaly with aortic atherosclerosis. Electronically Signed   By: Jeannine Boga M.D.   On: 08/18/2017 22:55   Dg Abd 2 Views  Result Date: 08/19/2017 CLINICAL DATA:  Chronic constipation. EXAM: ABDOMEN - 2 VIEW COMPARISON:  PET/CT performed 08/18/2017 FINDINGS: A small right pleural effusion is again noted. No free intra-abdominal air is seen on the provided decubitus view. Residual contrast is seen within the colon. The colon is partially filled with stool, without significant radiographic evidence for constipation. Mild degenerative change is noted at the lower lumbar spine. IMPRESSION: 1. Small to moderate amount of stool noted in the colon. No radiographic evidence for constipation. No free intra-abdominal air seen. 2. Small right pleural effusion noted. Electronically Signed   By: Garald Balding M.D.   On: 08/19/2017 00:41   US Thoracentesis Asp Pleural Space W/img Guide  Result Date: 08/19/2017 INDICATION: Patient with history of right lung mass with recurrent malignant right  pleural effusion, dyspnea; request received for diagnostic and therapeutic right thoracentesis. EXAM: ULTRASOUND GUIDED DIAGNOSTIC AND THERAPEUTIC RIGHT THORACENTESIS MEDICATIONS: None COMPLICATIONS: None immediate. PROCEDURE: An ultrasound guided thoracentesis was thoroughly discussed with the patient and questions answered. The benefits, risks, alternatives and complications were also discussed. The patient understands and wishes to proceed with the procedure. Written consent was obtained. Ultrasound was performed to localize and mark an adequate pocket of fluid in  the right chest. The area was then prepped and draped in the normal sterile fashion. 1% Lidocaine was used for local anesthesia. Under ultrasound guidance a 6 Fr Safe-T-Centesis catheter was introduced. Thoracentesis was performed. The catheter was removed and a dressing applied. FINDINGS: A total of approximately 1 liter of slightly hazy, yellow fluid was removed. Samples were sent to the laboratory as requested by the clinical team. IMPRESSION: Successful ultrasound guided diagnostic and therapeutic right thoracentesis yielding 1 liter of pleural fluid. Follow-up chest x-ray revealed no pneumothorax. Read by: Rowe Robert, PA-C Electronically Signed   By: Lucrezia Europe M.D.   On: 08/19/2017 10:34        Scheduled Meds: . aspirin EC  81 mg Oral QHS  . citalopram  20 mg Oral Daily  . enoxaparin (LOVENOX) injection  40 mg Subcutaneous Q24H  . levothyroxine  125 mcg Oral QAC breakfast  . lidocaine      . pantoprazole  40 mg Oral Daily  . simvastatin  40 mg Oral q1800  . sodium chloride flush  3 mL Intravenous Q12H   Continuous Infusions: . sodium chloride 100 mL/hr at 08/19/17 0840  . sodium chloride       LOS: 0 days     Cordelia Poche, MD [facility] 08/19/2017, 2:17 PM Pager: [phone]  If 7PM-7AM, please contact night-coverage [email or web address] Password [email or web address] 08/19/2017, 2:17 PM

## 2017-08-19 NOTE — ED Notes (Signed)
Checked for urine. None present in w/ condom cath. Will check back later.

## 2017-08-19 NOTE — ED Notes (Signed)
Placed condom cath per nurse approval.

## 2017-08-19 NOTE — Progress Notes (Signed)
Initial Nutrition Assessment  DOCUMENTATION CODES:   Severe malnutrition in context of acute illness/injury  INTERVENTION:  - Diet advancement as medically feasible. - Will order ONS with diet advancement.  - Will place order for weight to be obtained today.   NUTRITION DIAGNOSIS:   Severe Malnutrition related to acute illness, catabolic illness, cancer and cancer related treatments as evidenced by moderate fat depletion, moderate muscle depletion, energy intake < or equal to 50% for > or equal to 5 days.  GOAL:   Patient will meet greater than or equal to 90% of their needs  MONITOR:   Diet advancement, Weight trends, Labs  REASON FOR ASSESSMENT:   Malnutrition Screening Tool  ASSESSMENT:   84 y.o. male with medical history significant for hypothyroidism, depression, and recent diagnosis of right lung mass with diffuse osseous metastases and right pleural fluid cytology diagnostic of non-small cell carcinoma. Pt presented to the ED for evaluation of respiratory distress and intermittent confusion. Patient's family has noted that he has been short of breath with labored breathing, increased work of breathing with minimal exertion. They also report that the patient has been confused intermittently over the past several days, and they attribute this to a recent increase in his long-acting morphine. There was no fall or trauma reported. Patient does not use supplemental oxygen normally.  BMI indicates normal weight. Pt has been NPO since admission. Pt was seen by this RD on 4/10 at which time pt's daughter and wife were at bedside and questions and concerns surrounding nutrition were able to be addressed. No family/visitors present at the time of visit today. Review of chart indicates that pt is a/o to self and place. Pt seems somewhat confused/is slow to respond at times during RD visit.   He was very pleasant but at that time preferred to talk with MD later today to find out what the  medical plan is and what his diagnoses are. Pt reports pain to abdomen and diffusely. He states "my appetite is poor and I have not eaten in 8-10 days. When pt was seen by this RD last week, he had consumed a fruit cup for lunch and was not interested in having anything else during that meal. It seems unlikely that pt was meeting nutrition needs over the past week.   NFPE outlined below and consistent with findings last week. It does not appear that new weight was obtained this admission as weight today exactly the same as weight recorded on 4/8. Pt has R thoracentesis this AM with 1 L removed.   Medications reviewed; 125 mcg oral Synthroid/day, 40 mg oral Protonix/day.   Labs reviewed; Na: 132 mmol/L, Cl: 96 mmol/L, BUN: 35 mg/dL, creatinine: 1.3 mg/dL, ammonia: <9 umol/L.   IVF: NS @ 100 mL/hr.      NUTRITION - FOCUSED PHYSICAL EXAM:    Most Recent Value  Orbital Region  Mild depletion  Upper Arm Region  Moderate depletion  Thoracic and Lumbar Region  Mild depletion  Buccal Region  Mild depletion  Temple Region  Mild depletion  Clavicle Bone Region  Mild depletion  Clavicle and Acromion Bone Region  Moderate depletion  Scapular Bone Region  Unable to assess  Dorsal Hand  No depletion  Patellar Region  Mild depletion  Anterior Thigh Region  Moderate depletion  Posterior Calf Region  Moderate depletion  Edema (RD Assessment)  None  Hair  Reviewed  Eyes  Reviewed  Mouth  Reviewed  Skin  Reviewed  Nails  Reviewed  Diet Order:  Diet NPO time specified Except for: Sips with Meds, Ice Chips  EDUCATION NEEDS:   No education needs have been identified at this time  Skin:  Skin Assessment: Reviewed RN Assessment  Last BM:  PTA/unknown  Height:   Ht Readings from Last 1 Encounters:  08/19/17 5' 7.01" (1.702 m)    Weight:   Wt Readings from Last 1 Encounters:  08/19/17 153 lb (69.4 kg)    Ideal Body Weight:  67.27 kg  BMI:  Body mass index is 23.96  kg/m.  Estimated Nutritional Needs:   Kcal:  2085-2290 (30-33 kcal/kg)  Protein:  97-110 grams (1.4-1.6 grams/kg)  Fluid:  >/= 1.8 L/day      Jarome Matin, MS, RD, LDN, [facility] Inpatient Clinical Dietitian Pager # [phone] After hours/weekend pager # [phone]

## 2017-08-19 NOTE — H&P (Signed)
History and Physical    Peter Velasquez PPI:[id number] DOB: 1933-07-20 DOA: 08/18/2017  PCP: [name], MD   Patient coming from: Home  Chief Complaint: Respiratory distress, confusion   HPI: Peter Velasquez is a 84 y.o. male with medical history significant for hypothyroidism, depression, and recent diagnosis of right lung mass with diffuse osseous metastases and right pleural fluid cytology diagnostic of non-small cell carcinoma, now presenting to the emergency department for evaluation of respiratory distress and intermittent confusion.  Patient's family has noted that he has been short of breath with labored breathing.  They note increased work of breathing with minimal exertion.  They also report that the patient has been confused intermittently over the past several days, and they attribute this to a recent increase in his long-acting morphine.  There was no fall or trauma reported.  Patient does not use supplemental oxygen normally.  ED Course: Upon arrival to the ED, patient is found to be afebrile, saturating 88% on room air while at rest, and vitals otherwise stable.  EKG features a sinus rhythm and chest x-ray is notable for moderate right and trace left pleural effusion, right upper lobe mass, and underlying COPD changes.  Chemistry panel features a sodium of 134, potassium 5.7, and creatinine of 1.45, up from an apparent baseline of roughly 1.2.  CBC is notable for leukocytosis to 36,400, stable normocytic anemia with hemoglobin of 11.3, and a new thrombocytosis with platelets 645,000.  Troponin is undetectable and urinalysis is unremarkable.  Patient was given a liter of normal saline in the ED.  He remains hemodynamically stable and will be observed on the telemetry unit for ongoing evaluation and management of acute hypoxic respiratory failure suspected secondary to recurrent right pleural effusion, likely malignant.  Review of Systems:  All other systems reviewed and apart from  HPI, are negative.  Past Medical History:  Diagnosis Date  . BENIGN PROSTATIC HYPERTROPHY 01/13/2007   Qualifier: Diagnosis of  By: Jenny Reichmann MD, Hunt Oris   . COPD 01/09/2007   Qualifier: Diagnosis of  By: Elveria Royals   . DEGENERATIVE JOINT DISEASE 01/09/2007   Qualifier: Diagnosis of  By: Elveria Royals   . DEPRESSION 01/09/2007   Qualifier: Diagnosis of  By: Elveria Royals   . DIVERTICULOSIS, COLON 01/09/2007   Qualifier: Diagnosis of  By: Elveria Royals   . ERECTILE DYSFUNCTION 01/09/2007   Qualifier: Diagnosis of  By: Elveria Royals   . GERD 01/09/2007   Qualifier: Diagnosis of  By: Elveria Royals   . HYPERLIPIDEMIA 01/09/2007   Qualifier: Diagnosis of  By: Elveria Royals   . HYPOTHYROIDISM 01/09/2007   Qualifier: Diagnosis of  By: Elveria Royals   . PALPITATIONS, HX OF 01/09/2007   Qualifier: Diagnosis of  By: Elveria Royals   . TINNITUS 01/09/2007   Qualifier: Diagnosis of  By: Elveria Royals     History reviewed. No pertinent surgical history.   reports that he quit smoking about 19 years ago. He has never used smokeless tobacco. He reports that he drinks alcohol. He reports that he does not use drugs.  Allergies  Allergen Reactions  . Morphine And Related Other (See Comments)    Hallucinations, spaced out, acting strangely, labored breathing    Family History  Problem Relation Age of Onset  . Stroke Father   . Hypertension Father   . Other Sister      Prior to Admission medications   Medication Sig  Start Date End Date Taking? Authorizing Provider  aspirin EC 81 MG tablet Take 81 mg by mouth at bedtime.    Yes [provider]  atenolol (TENORMIN) 50 MG tablet Take 50 mg by mouth daily.   Yes [provider]  citalopram (CELEXA) 20 MG tablet Take 20 mg by mouth daily.   Yes [provider]  levothyroxine (SYNTHROID, LEVOTHROID) 125 MCG tablet Take 1 tablet by mouth  daily. 07/05/17  Yes [provider]  morphine (MS CONTIN) 15 MG 12 hr tablet Take 1 tablet (15 mg total) by mouth every 12 (twelve) hours. 08/14/17  Yes Charlynne Cousins, MD  omeprazole (PRILOSEC) 20 MG capsule Take 20 mg by mouth daily.   Yes [provider]  oxyCODONE-acetaminophen (PERCOCET/ROXICET) 5-325 MG tablet Take 2 tablets by mouth every 4 (four) hours as needed for moderate pain. 08/14/17  Yes Charlynne Cousins, MD  simvastatin (ZOCOR) 40 MG tablet Take 40 mg by mouth daily.   Yes [provider]    Physical Exam: Vitals:   08/19/17 0110 08/19/17 0229 08/19/17 0400 08/19/17 0415  BP: 111/63 125/69 (!) 119/47   Pulse: 81 84 84 81  Resp: (!) 22 (!) 25 18 (!) 22  Temp:      TempSrc:      SpO2: 95% 96% (!) 88% 97%      Constitutional: NAD, calm  Eyes: PERTLA, lids and conjunctivae normal ENMT: Mucous membranes are moist. Posterior pharynx clear of any exudate or lesions.   Neck: normal, supple, no masses, no thyromegaly Respiratory: Breath sounds diminished, particularly at right base. Normal respiratory effort.    Cardiovascular: S1 & S2 heard, regular rate and rhythm. No extremity edema. No significant JVD. Abdomen: No distension, no tenderness, soft. Bowel sounds normal.  Musculoskeletal: no clubbing / cyanosis. No joint deformity upper and lower extremities.   Skin: no significant rashes, lesions, ulcers. Warm, dry, well-perfused. Neurologic: No facial asymmetry. Sensation intact. Moving all extremities. Gross hearing deficit.  Psychiatric: Alert and oriented x 3. Pleasant and cooperative.     Labs on Admission: I have personally reviewed following labs and imaging studies  CBC: Recent Labs  Lab 08/18/17 2241 08/18/17 2312  WBC 36.4*  --   NEUTROABS 32.0*  --   HGB 11.3* 13.3  HCT 35.7* 39.0  MCV 90.4  --   PLT 645*  --    Basic Metabolic Panel: Recent Labs  Lab 08/18/17 2241 08/18/17 2312 08/19/17 0434  NA 134* 132*  --    K 5.7* 5.6* 4.9  CL 95* 96*  --   CO2 26  --   --   GLUCOSE 105* 108*  --   BUN 25* 35*  --   CREATININE 1.45* 1.30*  --   CALCIUM 11.0*  --   --    GFR: Estimated Creatinine Clearance: 41.7 mL/min (A) (by C-G formula based on SCr of 1.3 mg/dL (H)). Liver Function Tests: Recent Labs  Lab 08/18/17 2241  AST 34  ALT 23  ALKPHOS 148*  BILITOT 2.1*  PROT 6.6  ALBUMIN 2.7*   No results for input(s): LIPASE, AMYLASE in the last 168 hours. Recent Labs  Lab 08/19/17 0108  AMMONIA <9*   Coagulation Profile: No results for input(s): INR, PROTIME in the last 168 hours. Cardiac Enzymes: No results for input(s): CKTOTAL, CKMB, CKMBINDEX, TROPONINI in the last 168 hours. BNP (last 3 results) No results for input(s): PROBNP in the last 8760 hours. HbA1C: No results for  input(s): HGBA1C in the last 72 hours. CBG: Recent Labs  Lab 08/18/17 0749  GLUCAP 103*   Lipid Profile: No results for input(s): CHOL, HDL, LDLCALC, TRIG, CHOLHDL, LDLDIRECT in the last 72 hours. Thyroid Function Tests: No results for input(s): TSH, T4TOTAL, FREET4, T3FREE, THYROIDAB in the last 72 hours. Anemia Panel: No results for input(s): VITAMINB12, FOLATE, FERRITIN, TIBC, IRON, RETICCTPCT in the last 72 hours. Urine analysis:    Component Value Date/Time   COLORURINE YELLOW 08/19/2017 0320   APPEARANCEUR CLEAR 08/19/2017 0320   LABSPEC 1.024 08/19/2017 0320   PHURINE 5.0 08/19/2017 0320   GLUCOSEU NEGATIVE 08/19/2017 0320   HGBUR NEGATIVE 08/19/2017 0320   BILIRUBINUR NEGATIVE 08/19/2017 0320   KETONESUR 5 (A) 08/19/2017 0320   PROTEINUR NEGATIVE 08/19/2017 0320   NITRITE NEGATIVE 08/19/2017 0320   LEUKOCYTESUR NEGATIVE 08/19/2017 0320   Sepsis Labs: @LABRCNTIP (procalcitonin:4,lacticidven:4) )No results found for this or any previous visit (from the past 240 hour(s)).   Radiological Exams on Admission: Nm Pet Image Initial (pi) Skull Base To Thigh  Result Date: 08/18/2017 CLINICAL DATA:   Initial treatment strategy for right upper lobe mass. EXAM: NUCLEAR MEDICINE PET SKULL BASE TO THIGH TECHNIQUE: 7.9 mCi F-18 FDG was injected intravenously. Full-ring PET imaging was performed from the skull base to thigh after the radiotracer. CT data was obtained and used for attenuation correction and anatomic localization. Fasting blood glucose: 103 mg/dl COMPARISON:  CT abdomen/pelvis dated 08/11/2017. CT chest dated 08/10/2017. FINDINGS: Mediastinal blood pool activity: SUV max 2.2 NECK: No hypermetabolic cervical lymphadenopathy. Incidental CT findings: none CHEST: 4.3 cm posterior right upper lobe mass, max SUV 17.5, corresponding to suspected primary bronchogenic neoplasm. Additional pleural-based nodularity at the right lung apex, max SUV 6.2 (PET image 56). Thoracic nodal metastases, including: --1.2 cm short axis right paratracheal node (series 4/image 35), max SUV 20.9 --1.5 cm short axis right hilar node (series 4/image 39), max SUV 21.7 Moderate right pleural effusion. Associated right lower lobe compressive atelectasis. Trace left pleural effusion. Incidental CT findings: Atherosclerotic calcifications of the aortic arch. Coronary atherosclerosis of the LAD. ABDOMEN/PELVIS: At least 4 hepatic metastases, including: --Lesion in segment 4A (PET image 102), max SUV 11.9 --Lesion in segment 6 (PET image 115), max SUV 11.7 --Lesion in segment 3 (PET image 119), max SUV 5.9 12 mm left adrenal metastasis (series 4/image 130), max SUV 8.4. No hypermetabolic lymphadenopathy in the abdomen/pelvis. Incidental CT findings: 7.2 cm left upper pole renal cyst. Atherosclerotic calcifications the abdominal aorta and branch vessels. Prostatomegaly. Penile prosthesis. SKELETON: Widespread osseous metastases throughout the visualized axial and appendicular skeleton, including following representative lesions: --Lower cervical vertebral body, max SUV 42.4 --Left scapula, max SUV 19.1 --Lower thoracic vertebral body, max  SUV 48.7 --Mid lumbar vertebral body, max SUV 26.8 --Left iliac bone, max SUV 36.5 --Right proximal femur, max SUV 29.8 Incidental CT findings: none IMPRESSION: 4.3 cm right upper lobe mass, corresponding to suspected primary bronchogenic neoplasm. Associated mediastinal and right hilar nodal metastases. Moderate right pleural effusion.  Trace left pleural effusion. Hepatic and left adrenal metastases. Widespread osseous metastases throughout the visualized axial and appendicular skeleton, including representative lesions above. Electronically Signed   By: Julian Hy M.D.   On: 08/18/2017 13:51   Dg Chest Port 1 View  Result Date: 08/18/2017 CLINICAL DATA:  Initial evaluation for acute shortness of breath. EXAM: PORTABLE CHEST 1 VIEW COMPARISON:  Comparison made with prior PET-CT from earlier the same day. FINDINGS: Mild cardiomegaly, stable. Mediastinal silhouette within normal limits.  Aortic atherosclerosis. Lungs hypoinflated. Veiling opacity overlying the right lung consistent with pleural effusion. Underlying changes related to COPD. Focal approximate 4 cm opacity within the right suprahilar region consistent with patient's known right upper lobe mass. Superimposed bibasilar atelectatic changes. Trace left pleural effusion. No pneumothorax. No other definite focal air space disease. Overall, appearance is similar to previous. Osseous structures are unchanged. IMPRESSION: 1. Moderate right with trace left pleural effusion with associated bibasilar atelectatic changes. 2. Approximate 4 cm right suprahilar opacity, consistent with patient's known right upper lobe mass. 3. Underlying COPD. 4. Mild cardiomegaly with aortic atherosclerosis. Electronically Signed   By: Jeannine Boga M.D.   On: 08/18/2017 22:55   Dg Abd 2 Views  Result Date: 08/19/2017 CLINICAL DATA:  Chronic constipation. EXAM: ABDOMEN - 2 VIEW COMPARISON:  PET/CT performed 08/18/2017 FINDINGS: A small right pleural effusion is  again noted. No free intra-abdominal air is seen on the provided decubitus view. Residual contrast is seen within the colon. The colon is partially filled with stool, without significant radiographic evidence for constipation. Mild degenerative change is noted at the lower lumbar spine. IMPRESSION: 1. Small to moderate amount of stool noted in the colon. No radiographic evidence for constipation. No free intra-abdominal air seen. 2. Small right pleural effusion noted. Electronically Signed   By: Garald Balding M.D.   On: 08/19/2017 00:41    EKG: Independently reviewed. Sinus rhythm.   Assessment/Plan   1. Respiratory distress with hypoxia; recurrent right pleural effusion  - Presents has been developing respiratory distress at home with minimal exertion and is noted to be hypoxic on arrival  - No fever or significant cough reported  - Imaging notable for moderate right pleural effusion; this is recurrent and likely malignant  - He is stable at rest on 2 Lpm supplemental O2  - No tachycardia, leg swelling or tenderness, or chest pain to suggest PE  - Suspect this is secondary to the recurrent effusion  - Continue supplemental O2 prn, plan for therapeutic thoracentesis   2. Acute encephalopathy   - Family has noted intermittent confusion over past several days after his long-acting morphine was increased  - No focal neurologic deficits identified  - MRI brain one week ago without mets  - Ammonia level undetectably low in ED  - Hold morphine for now as he denies pain on admission, check TSH, B12, folate, and RPR, continue supportive care   3. Suspected lung cancer with metastases - Recently found to have right lung mass and is following with oncology  - PET-CT performed 4/16 with the RUL mass (suspected primary bronchogenic neoplasm) and widespread osseous mets  - Cytology from right thoracentesis (08/11/17) reveals non-small cell carcinoma  - Patient's oncologist added to care team    4.  Depression; anxiety  - Stable, continue Celexa    5. Hyperkalemia  - Serum potassium is 5.7 on admission without EKG changes  - Resolved with IVF in ED  - Continue IVF hydration and cardiac monitoring    6. CKD stage II  - SCr is 1.45 on admission, up from apparent baseline of ~1.2  - He was treated with 1 liter NS in ED and is continued on IVF hydration  - Renally-dose medications, avoid nephrotoxins    7. Thrombocytosis  - Platelets 645k on admission  - Likely reactive   8. Hyperbilirubinemia  - Total bilirubin 2.1 on admission, previously wnl  - CT abd/pelvis one week earlier with uncomplicated cholelithiasis and no liver lesion noted  -  Fractionate, repeat LFT's in am    DVT prophylaxis: Lovenox  Code Status: DNR  Family Communication: Discussed with patient Consults called: None Admission status: Observation    Vianne Bulls, MD [facility] Pager [phone]  If 7PM-7AM, please contact night-coverage [email or web address] Password [email or web address]  08/19/2017, 5:05 AM

## 2017-08-19 NOTE — ED Notes (Signed)
2 L [place] removed to see how pt tolerates RA

## 2017-08-19 NOTE — Care Management Note (Signed)
Case Management Note  Patient Details  Name: Peter Velasquez MRN: [id number] Date of Birth: [birth year]  Subjective/Objective:   84 yo admitted with Malignant Pleural Effusion                 Action/Plan: Pt was recently discharged and [facility]st program was initiated on 4/12.  Will need new HHPT/RN orders at discharge to resume home health services.  Expected Discharge Date:                  Expected Discharge Plan:  [place]  In-House Referral:     Discharge planning Services  CM Consult  Post Acute Care Choice:    Choice offered to:  Patient  DME Arranged:    DME Agency:     HH Arranged:  RN, PT [employer] Agency:  [facility]  Status of Service:  In process, will continue to follow  If discussed at Long Length of Stay Meetings, dates discussed:    Additional CommentsLynnell Catalan, RN 08/19/2017, 1:24 PM  [phone]

## 2017-08-20 ENCOUNTER — Telehealth: Payer: Self-pay | Admitting: Medical Oncology

## 2017-08-20 ENCOUNTER — Inpatient Hospital Stay (HOSPITAL_COMMUNITY)
Admit: 2017-08-20 | Discharge: 2017-08-20 | Disposition: A | Discharge status: Home / Self Care | Payer: Medicare Other | Attending: Family Medicine | Admitting: Family Medicine

## 2017-08-20 ENCOUNTER — Telehealth: Payer: Self-pay | Admitting: *Deleted

## 2017-08-20 ENCOUNTER — Telehealth: Payer: Self-pay | Admitting: Internal Medicine

## 2017-08-20 DIAGNOSIS — F419 Anxiety disorder, unspecified: Secondary | ICD-10-CM | POA: Diagnosis present

## 2017-08-20 DIAGNOSIS — G9340 Encephalopathy, unspecified: Secondary | ICD-10-CM | POA: Diagnosis not present

## 2017-08-20 DIAGNOSIS — Z6823 Body mass index (BMI) 23.0-23.9, adult: Secondary | ICD-10-CM | POA: Diagnosis not present

## 2017-08-20 DIAGNOSIS — C7970 Secondary malignant neoplasm of unspecified adrenal gland: Secondary | ICD-10-CM | POA: Diagnosis not present

## 2017-08-20 DIAGNOSIS — J90 Pleural effusion, not elsewhere classified: Secondary | ICD-10-CM | POA: Diagnosis not present

## 2017-08-20 DIAGNOSIS — Z66 Do not resuscitate: Secondary | ICD-10-CM | POA: Diagnosis present

## 2017-08-20 DIAGNOSIS — D649 Anemia, unspecified: Secondary | ICD-10-CM | POA: Diagnosis not present

## 2017-08-20 DIAGNOSIS — F329 Major depressive disorder, single episode, unspecified: Secondary | ICD-10-CM | POA: Diagnosis present

## 2017-08-20 DIAGNOSIS — E039 Hypothyroidism, unspecified: Secondary | ICD-10-CM | POA: Diagnosis present

## 2017-08-20 DIAGNOSIS — K219 Gastro-esophageal reflux disease without esophagitis: Secondary | ICD-10-CM | POA: Diagnosis present

## 2017-08-20 DIAGNOSIS — I129 Hypertensive chronic kidney disease with stage 1 through stage 4 chronic kidney disease, or unspecified chronic kidney disease: Secondary | ICD-10-CM | POA: Diagnosis present

## 2017-08-20 DIAGNOSIS — N183 Chronic kidney disease, stage 3 (moderate): Secondary | ICD-10-CM | POA: Diagnosis present

## 2017-08-20 DIAGNOSIS — Z7982 Long term (current) use of aspirin: Secondary | ICD-10-CM | POA: Diagnosis not present

## 2017-08-20 DIAGNOSIS — I10 Essential (primary) hypertension: Secondary | ICD-10-CM | POA: Diagnosis not present

## 2017-08-20 DIAGNOSIS — R4182 Altered mental status, unspecified: Secondary | ICD-10-CM | POA: Diagnosis not present

## 2017-08-20 DIAGNOSIS — Z515 Encounter for palliative care: Secondary | ICD-10-CM | POA: Diagnosis not present

## 2017-08-20 DIAGNOSIS — C3431 Malignant neoplasm of lower lobe, right bronchus or lung: Secondary | ICD-10-CM | POA: Diagnosis not present

## 2017-08-20 DIAGNOSIS — D72829 Elevated white blood cell count, unspecified: Secondary | ICD-10-CM

## 2017-08-20 DIAGNOSIS — F339 Major depressive disorder, recurrent, unspecified: Secondary | ICD-10-CM | POA: Diagnosis not present

## 2017-08-20 DIAGNOSIS — C7951 Secondary malignant neoplasm of bone: Secondary | ICD-10-CM | POA: Diagnosis not present

## 2017-08-20 DIAGNOSIS — J910 Malignant pleural effusion: Secondary | ICD-10-CM | POA: Diagnosis not present

## 2017-08-20 DIAGNOSIS — Z87891 Personal history of nicotine dependence: Secondary | ICD-10-CM | POA: Diagnosis not present

## 2017-08-20 DIAGNOSIS — E785 Hyperlipidemia, unspecified: Secondary | ICD-10-CM | POA: Diagnosis not present

## 2017-08-20 DIAGNOSIS — C3411 Malignant neoplasm of upper lobe, right bronchus or lung: Secondary | ICD-10-CM | POA: Diagnosis present

## 2017-08-20 DIAGNOSIS — C3490 Malignant neoplasm of unspecified part of unspecified bronchus or lung: Secondary | ICD-10-CM | POA: Diagnosis not present

## 2017-08-20 DIAGNOSIS — R0602 Shortness of breath: Secondary | ICD-10-CM | POA: Diagnosis not present

## 2017-08-20 DIAGNOSIS — R64 Cachexia: Secondary | ICD-10-CM | POA: Diagnosis present

## 2017-08-20 DIAGNOSIS — E875 Hyperkalemia: Secondary | ICD-10-CM | POA: Diagnosis not present

## 2017-08-20 DIAGNOSIS — C787 Secondary malignant neoplasm of liver and intrahepatic bile duct: Secondary | ICD-10-CM | POA: Diagnosis not present

## 2017-08-20 DIAGNOSIS — J9601 Acute respiratory failure with hypoxia: Secondary | ICD-10-CM | POA: Diagnosis not present

## 2017-08-20 DIAGNOSIS — J984 Other disorders of lung: Secondary | ICD-10-CM | POA: Diagnosis not present

## 2017-08-20 DIAGNOSIS — C782 Secondary malignant neoplasm of pleura: Secondary | ICD-10-CM | POA: Diagnosis not present

## 2017-08-20 DIAGNOSIS — R0603 Acute respiratory distress: Secondary | ICD-10-CM | POA: Diagnosis not present

## 2017-08-20 DIAGNOSIS — R9401 Abnormal electroencephalogram [EEG]: Secondary | ICD-10-CM | POA: Diagnosis present

## 2017-08-20 DIAGNOSIS — C771 Secondary and unspecified malignant neoplasm of intrathoracic lymph nodes: Secondary | ICD-10-CM | POA: Diagnosis not present

## 2017-08-20 DIAGNOSIS — G92 Toxic encephalopathy: Secondary | ICD-10-CM | POA: Diagnosis present

## 2017-08-20 DIAGNOSIS — J449 Chronic obstructive pulmonary disease, unspecified: Secondary | ICD-10-CM | POA: Diagnosis not present

## 2017-08-20 DIAGNOSIS — E43 Unspecified severe protein-calorie malnutrition: Secondary | ICD-10-CM | POA: Diagnosis present

## 2017-08-20 DIAGNOSIS — T402X5A Adverse effect of other opioids, initial encounter: Secondary | ICD-10-CM | POA: Diagnosis present

## 2017-08-20 DIAGNOSIS — N182 Chronic kidney disease, stage 2 (mild): Secondary | ICD-10-CM | POA: Diagnosis not present

## 2017-08-20 DIAGNOSIS — F332 Major depressive disorder, recurrent severe without psychotic features: Secondary | ICD-10-CM | POA: Diagnosis present

## 2017-08-20 LAB — BLOOD GAS, ARTERIAL
Acid-Base Excess: 2.1 mmol/L — ABNORMAL HIGH (ref 0.0–2.0)
BICARBONATE: 25.7 mmol/L (ref 20.0–28.0)
Drawn by: 257881
O2 Content: 2 L/min
O2 Saturation: 93.3 %
PATIENT TEMPERATURE: 98.6
PH ART: 7.442 (ref 7.350–7.450)
PO2 ART: 66.2 mmHg — AB (ref 83.0–108.0)
pCO2 arterial: 38.3 mmHg (ref 32.0–48.0)

## 2017-08-20 LAB — CBC WITH DIFFERENTIAL/PLATELET
BASOS PCT: 0 %
Basophils Absolute: 0 10*3/uL (ref 0.0–0.1)
EOS PCT: 1 %
Eosinophils Absolute: 0.3 10*3/uL (ref 0.0–0.7)
HEMATOCRIT: 35.4 % — AB (ref 39.0–52.0)
HEMOGLOBIN: 11.3 g/dL — AB (ref 13.0–17.0)
LYMPHS ABS: 1.8 10*3/uL (ref 0.7–4.0)
LYMPHS PCT: 6 %
MCH: 28.7 pg (ref 26.0–34.0)
MCHC: 31.9 g/dL (ref 30.0–36.0)
MCV: 89.8 fL (ref 78.0–100.0)
MONOS PCT: 6 %
Monocytes Absolute: 1.8 10*3/uL — ABNORMAL HIGH (ref 0.1–1.0)
NEUTROS ABS: 26.8 10*3/uL — AB (ref 1.7–7.7)
Neutrophils Relative %: 87 %
Platelets: 417 10*3/uL — ABNORMAL HIGH (ref 150–400)
RBC: 3.94 MIL/uL — ABNORMAL LOW (ref 4.22–5.81)
RDW: 13.9 % (ref 11.5–15.5)
WBC: 30.7 10*3/uL — ABNORMAL HIGH (ref 4.0–10.5)

## 2017-08-20 LAB — URINE CULTURE: CULTURE: NO GROWTH

## 2017-08-20 LAB — COMPREHENSIVE METABOLIC PANEL
ALBUMIN: 2.6 g/dL — AB (ref 3.5–5.0)
ALT: 21 U/L (ref 17–63)
AST: 23 U/L (ref 15–41)
Alkaline Phosphatase: 129 U/L — ABNORMAL HIGH (ref 38–126)
Anion gap: 14 (ref 5–15)
BUN: 20 mg/dL (ref 6–20)
CHLORIDE: 101 mmol/L (ref 101–111)
CO2: 24 mmol/L (ref 22–32)
Calcium: 11.2 mg/dL — ABNORMAL HIGH (ref 8.9–10.3)
Creatinine, Ser: 0.96 mg/dL (ref 0.61–1.24)
GFR calc Af Amer: >60 mL/min (ref >60)
GFR calc non Af Amer: >60 mL/min (ref >60)
GLUCOSE: 105 mg/dL — AB (ref 65–99)
POTASSIUM: 4.2 mmol/L (ref 3.5–5.1)
SODIUM: 139 mmol/L (ref 135–145)
Total Bilirubin: 1 mg/dL (ref 0.3–1.2)
Total Protein: 6.7 g/dL (ref 6.5–8.1)

## 2017-08-20 LAB — MRSA PCR SCREENING: MRSA by PCR: NEGATIVE

## 2017-08-20 LAB — FOLATE RBC
Folate, Hemolysate: 386.3 ng/mL
Folate, RBC: 1091 ng/mL (ref >498)
Hematocrit: 35.4 % — ABNORMAL LOW (ref 37.5–51.0)

## 2017-08-20 LAB — RPR: RPR Ser Ql: NONREACTIVE

## 2017-08-20 LAB — HIV ANTIBODY (ROUTINE TESTING W REFLEX): HIV Screen 4th Generation wRfx: NONREACTIVE

## 2017-08-20 LAB — T4, FREE: Free T4: 1.27 ng/dL — ABNORMAL HIGH (ref 0.61–1.12)

## 2017-08-20 LAB — GLUCOSE, CAPILLARY: Glucose-Capillary: 99 mg/dL (ref 65–99)

## 2017-08-20 MED ORDER — KETOROLAC TROMETHAMINE 30 MG/ML IJ SOLN
15.0000 mg | Freq: Four times a day (QID) | INTRAMUSCULAR | Status: DC | PRN
  Administered 2017-08-20 – 2017-08-21 (×2): 15 mg via INTRAVENOUS
  Filled 2017-08-20 (×2): qty 1

## 2017-08-20 MED ORDER — KETOROLAC TROMETHAMINE 30 MG/ML IJ SOLN: 30.0000 mg | Freq: Once | INTRAMUSCULAR | Status: DC

## 2017-08-20 MED ORDER — VANCOMYCIN HCL IN DEXTROSE 1-5 GM/200ML-% IV SOLN: 1000.0000 mg | INTRAVENOUS | Status: DC

## 2017-08-20 MED ORDER — DEXTROSE-NACL 5-0.45 % IV SOLN
INTRAVENOUS | Status: DC
  Administered 2017-08-20 – 2017-08-21 (×2): via INTRAVENOUS

## 2017-08-20 MED ORDER — HYDRALAZINE HCL 20 MG/ML IJ SOLN
5.0000 mg | Freq: Three times a day (TID) | INTRAMUSCULAR | Status: DC | PRN
  Administered 2017-08-20: 5 mg via INTRAVENOUS
  Filled 2017-08-20: qty 1

## 2017-08-20 MED ORDER — FENTANYL CITRATE (PF) 100 MCG/2ML IJ SOLN
25.0000 ug | INTRAMUSCULAR | Status: DC | PRN
  Administered 2017-08-20 – 2017-08-21 (×2): 25 ug via INTRAVENOUS
  Filled 2017-08-20 (×2): qty 2

## 2017-08-20 MED ORDER — KETOROLAC TROMETHAMINE 30 MG/ML IJ SOLN
15.0000 mg | Freq: Once | INTRAMUSCULAR | Status: AC
  Administered 2017-08-20: 15 mg via INTRAVENOUS
  Filled 2017-08-20: qty 1

## 2017-08-20 MED ORDER — HYDROCODONE-ACETAMINOPHEN 5-325 MG PO TABS
1.0000 | ORAL_TABLET | Freq: Four times a day (QID) | ORAL | Status: DC | PRN
Start: 2017-08-20 — End: 2017-08-21

## 2017-08-20 MED ORDER — SODIUM CHLORIDE 0.9 % IV SOLN: INTRAVENOUS | Status: DC

## 2017-08-20 MED ORDER — PIPERACILLIN-TAZOBACTAM 3.375 G IVPB
3.3750 g | Freq: Three times a day (TID) | INTRAVENOUS | Status: DC
  Administered 2017-08-20: 3.375 g via INTRAVENOUS
  Filled 2017-08-20 (×2): qty 50

## 2017-08-20 MED ORDER — VANCOMYCIN HCL 10 G IV SOLR
1250.0000 mg | Freq: Once | INTRAVENOUS | Status: AC
  Administered 2017-08-20: 1250 mg via INTRAVENOUS
  Filled 2017-08-20: qty 1250

## 2017-08-20 MED ORDER — HYDROCODONE-ACETAMINOPHEN 5-325 MG PO TABS: 1.0000 | ORAL_TABLET | Freq: Four times a day (QID) | ORAL | Status: DC | PRN

## 2017-08-20 NOTE — Telephone Encounter (Signed)
Peter Velasquez is upset the appt was cancelled for tomorrow.The appt was to go over PET scan results. They do not know diagnosis. Peter Velasquez is asking for diagnosis and if he has extensive cancer the pt wants to die at home with hospice. The whole family is here.  I told Peter Velasquez he will see pt tomorrow am . She needs a time when [name] will be there so she can coordinate mother and her being there.

## 2017-08-20 NOTE — Telephone Encounter (Signed)
Called wife Romie Minus to advise that while patient is inpatient in hospital he will need to have his outpatient appointments rescheduled to after he is discharged and available to attend the appointment.  Patient has to be present at appointments in order for visit to happen.  Unable to see patients family only at the appointment.    LM for wife to call back to reschedule.

## 2017-08-20 NOTE — Progress Notes (Signed)
Pharmacy Antibiotic Note  Peter Velasquez is a 84 y.o. male admitted on 08/18/2017 with resp distress, confusion. Recent dx RL mass, bone mets, probable NSCLC. Found recurrent pleural effusion.  Marland Kitchen  Pharmacy has been consulted for Vancomycin & Zosyn dosing.  Plan: Zosyn 3.375g IV q8h (4 hour infusion).  Vancomycin 1250mg  x1, then 1000mg  q24 (for est AUC 452, using SCr 0.96)  Height: 5' 7.01" (170.2 cm) Weight: 146 lb 9.7 oz (66.5 kg) IBW/kg (Calculated) : 66.12  Temp (24hrs), Avg:98 F (36.7 C), Min:97.5 F (36.4 C), Max:98.5 F (36.9 C)  Recent Labs  Lab 08/18/17 2241 08/18/17 2312 08/20/17 0544  WBC 36.4*  --  30.7*  CREATININE 1.45* 1.30* 0.96    Estimated Creatinine Clearance: 56.4 mL/min (by C-G formula based on SCr of 0.96 mg/dL).    Allergies  Allergen Reactions  . Morphine And Related Other (See Comments)    Hallucinations, spaced out, acting strangely, labored breathing   Antimicrobials this admission: 4/18 Zosyn >>  4/18 Vancomycin >>   Dose adjustments this admission:  Microbiology results: 4/17 BCx: sent 4/17 UCx: sent  4/17 Pleural fluid: sent  4/18 MRSA PCR: sent  Thank you for allowing pharmacy to be a part of this patient's care.  Minda Ditto 08/20/2017 11:20 AM

## 2017-08-20 NOTE — Telephone Encounter (Signed)
Pt in hospital but dtr and wife will be at appt.

## 2017-08-20 NOTE — Progress Notes (Signed)
Had discussion with family over the telephone. After meeting with patient's oncologist, decision was made for full comfort care. Patient with unknown prognosis from my standpoint, however, if continues to be unable to take by mouth, would think he would be appropriate for residential hospice. Will place consult to case manager. Will plan for discharge 4/19 if able/bed available.  Cordelia Poche, MD [facility] 08/20/2017, 6:27 PM Pager: [phone]

## 2017-08-20 NOTE — Telephone Encounter (Signed)
I told [name] that Peter Velasquez will be there this afternoon and they are leaving now for the hospital.

## 2017-08-20 NOTE — Progress Notes (Signed)
EEG completed, results pending. 

## 2017-08-20 NOTE — Telephone Encounter (Signed)
R/s appt per 4/18 sch message - left message for patient with appt date and time.

## 2017-08-20 NOTE — Progress Notes (Signed)
PROGRESS NOTE    Peter Velasquez  MCN:[id number] DOB: [birth year] DOA: 08/18/2017 PCP: [name], MD   Brief Narrative: Peter Velasquez is a 84 y.o. male with a medical history significant for hypothyroidism, depression, and recent diagnosis of right lung mass with diffuse osseous metastases and right pleural fluid cytology diagnostic of non-small cell carcinoma. He presented secondary to respiratory distress and confusion, found to have recurrent pleural effusion. S/p thoracentesis on 4/17. Also with leukocytosis with no source.   Assessment & Plan:   Principal Problem:   Malignant pleural effusion Active Problems:   GERD   MDD (major depressive disorder), recurrent severe, without psychosis (HCC)   Normocytic normochromic anemia   Bone metastases (HCC)   CKD (chronic kidney disease), stage II   Hyperkalemia   Mass of right lung   Acute respiratory failure with hypoxia (HCC)   Acute encephalopathy   Hyperbilirubinemia   Thrombocytosis (HCC)   Respiratory distress   Respiratory distress Acute respiratory failure with hypoxia Thought secondary to pleural effusion. S/p thoracentesis with 1 liter output. -wean to room air as able  Acute encephalopathy Possibly toxic secondary to opiates. Patient has recent increase in dosage. Ammonia within normal limits. TSH slightly elevated. CT head unremarkable for acute process. Slightly worse today in setting of delirium overnight in addition to receiving Ativan. -Cultures pending -EEG, ABG  Malignant pleural effusion Non-small cell carcinoma Patient sees Dr. [name] as an outpatient.  Depression Anxiety -Continue Celexa  Hyperkalemia Resolved.  CKD stage III Stable.  Thrombocytosis Likely reactive -repeat CBC  Leukocytosis Afebrile. Likely infectious source -Blood/urine cultures -Empiric vancomycin/zosyn. Possible lung vs urinary source. -MRSA pcr  Hyperbilirubinemia New. Has a history of cholelithiasis.  Resolved.  Hypothyroidism TSH slightly elevated -Continue Synthroid  Essential hypertension On atenolol as an outpatient.. Mildly uncontrolled -Atenolol   DVT prophylaxis: Lovenox Code Status:   Code Status: DNR Family Communication: Wife and daughter on the telephone Disposition Plan: Discharge when baseline mental status   Consultants:   Interventional radiology  Procedures:   Thoracentesis (4/17)  Antimicrobials:  Vancomycin (4/18>>  Zosyn (4/18>>   Subjective: Patient confused today. Unable to obtain history.  Objective: Vitals:   08/19/17 1221 08/19/17 1508 08/20/17 0458 08/20/17 0500  BP:  (!) 135/100 (!) 155/73   Pulse:  77 78   Resp:   20   Temp:  (!) 97.5 F (36.4 C) 98.5 F (36.9 C)   TempSrc:  Oral Oral   SpO2:  96% 94%   Weight: 67.2 kg (148 lb 2.4 oz)   66.5 kg (146 lb 9.7 oz)  Height:        Intake/Output Summary (Last 24 hours) at 08/20/2017 1040 Last data filed at 08/20/2017 0730 Gross per 24 hour  Intake 1133.33 ml  Output 1000 ml  Net 133.33 ml   Filed Weights   08/19/17 0906 08/19/17 1221 08/20/17 0500  Weight: 69.4 kg (153 lb) 67.2 kg (148 lb 2.4 oz) 66.5 kg (146 lb 9.7 oz)    Examination:  General exam: Appears calm and comfortable Respiratory system: diminished bilaterally without wheezing or rales on anterior auscultation. No increased work of breathing. Cardiovascular system: Regular rate and rhythm. Normal S1 and S2. No heart murmurs present. No extra heart sounds Gastrointestinal system: Soft, non-tender, non-distended, no guarding, no rebound, no masses felt Central nervous system: Somnolent but arouses easily. Not oriented. Responds to commands. Extremities: No edema. No calf tenderness Skin: No cyanosis. No rashes Psychiatry: Judgement and insight appear impaired.  Data Reviewed: I have personally reviewed following labs and imaging studies  CBC: Recent Labs  Lab 08/18/17 2241 08/18/17 2312 08/20/17 0544    WBC 36.4*  --  30.7*  NEUTROABS 32.0*  --  26.8*  HGB 11.3* 13.3 11.3*  HCT 35.7* 39.0 35.4*  MCV 90.4  --  89.8  PLT 645*  --  101*   Basic Metabolic Panel: Recent Labs  Lab 08/18/17 2241 08/18/17 2312 08/19/17 0434 08/19/17 1442 08/20/17 0544  NA 134* 132*  --   --  139  K 5.7* 5.6* 4.9 4.5 4.2  CL 95* 96*  --   --  101  CO2 26  --   --   --  24  GLUCOSE 105* 108*  --   --  105*  BUN 25* 35*  --   --  20  CREATININE 1.45* 1.30*  --   --  0.96  CALCIUM 11.0*  --   --   --  11.2*   GFR: Estimated Creatinine Clearance: 56.4 mL/min (by C-G formula based on SCr of 0.96 mg/dL). Liver Function Tests: Recent Labs  Lab 08/18/17 2241 08/19/17 1442 08/20/17 0544  AST 34  --  23  ALT 23  --  21  ALKPHOS 148*  --  129*  BILITOT 2.1* 0.5 1.0  PROT 6.6  --  6.7  ALBUMIN 2.7*  --  2.6*   No results for input(s): LIPASE, AMYLASE in the last 168 hours. Recent Labs  Lab 08/19/17 0108  AMMONIA <9*   Coagulation Profile: No results for input(s): INR, PROTIME in the last 168 hours. Cardiac Enzymes: No results for input(s): CKTOTAL, CKMB, CKMBINDEX, TROPONINI in the last 168 hours. BNP (last 3 results) No results for input(s): PROBNP in the last 8760 hours. HbA1C: No results for input(s): HGBA1C in the last 72 hours. CBG: Recent Labs  Lab 08/18/17 0749 08/20/17 0756  GLUCAP 103* 99   Lipid Profile: No results for input(s): CHOL, HDL, LDLCALC, TRIG, CHOLHDL, LDLDIRECT in the last 72 hours. Thyroid Function Tests: Recent Labs    08/19/17 1442 08/20/17 0544  TSH 5.161*  --   FREET4  --  1.27*   Anemia Panel: Recent Labs    08/19/17 1442  VITAMINB12 1,483*   Sepsis Labs: No results for input(s): PROCALCITON, LATICACIDVEN in the last 168 hours.  Recent Results (from the past 240 hour(s))  Body fluid culture     Status: None (Preliminary result)   Collection Time: 08/19/17  9:37 AM  Result Value Ref Range Status   Specimen Description   Final    PLEURAL  RIGHT Performed at [facility] [address], [place], [place] [address]    Special Requests   Final    NONE Performed at [facility], [place] [address], [place], [place] [address]    Gram Stain   Final    RARE WBC PRESENT, PREDOMINANTLY MONONUCLEAR NO ORGANISMS SEEN    Culture   Final    NO GROWTH 1 DAY Performed at [facility], [place] [address], [place], [place] [address]    Report Status PENDING  Incomplete         Radiology Studies: Dg Chest 1 View  Result Date: 08/19/2017 CLINICAL DATA:  Post right thoracentesis EXAM: CHEST  1 VIEW COMPARISON:  08/18/2017 FINDINGS: Decreasing right pleural effusion following thoracentesis. No pneumothorax. Improving aeration in the lung bases with decreasing bibasilar atelectasis. Right upper lobe mass again noted. Heart is  upper limits normal in size. IMPRESSION: Decreasing right effusion following thoracentesis. No visible pneumothorax. Electronically Signed   By: Rolm Baptise M.D.   On: 08/19/2017 10:25   Ct Head Wo Contrast  Result Date: 08/19/2017 CLINICAL DATA:  84 y/o M; unexplained altered level of consciousness. EXAM: CT HEAD WITHOUT CONTRAST TECHNIQUE: Contiguous axial images were obtained from the base of the skull through the vertex without intravenous contrast. COMPARISON:  None. FINDINGS: Brain: No evidence of acute infarction, hemorrhage, hydrocephalus, extra-axial collection or mass lesion/mass effect. Moderate diffuse brain parenchymal volume loss and mild chronic microvascular ischemic changes. Vascular: Calcific atherosclerosis of carotid siphons. No hyperdense vessel identified Skull: Normal. Negative for fracture or focal lesion. Sinuses/Orbits: No acute finding. Other: None. IMPRESSION: 1. No acute intracranial abnormality identified. 2. Mild chronic microvascular ischemic changes and moderate parenchymal volume loss of the brain. Electronically Signed   By: Kristine Garbe  M.D.   On: 08/19/2017 16:47   Dg Chest Port 1 View  Result Date: 08/18/2017 CLINICAL DATA:  Initial evaluation for acute shortness of breath. EXAM: PORTABLE CHEST 1 VIEW COMPARISON:  Comparison made with prior PET-CT from earlier the same day. FINDINGS: Mild cardiomegaly, stable. Mediastinal silhouette within normal limits. Aortic atherosclerosis. Lungs hypoinflated. Veiling opacity overlying the right lung consistent with pleural effusion. Underlying changes related to COPD. Focal approximate 4 cm opacity within the right suprahilar region consistent with patient's known right upper lobe mass. Superimposed bibasilar atelectatic changes. Trace left pleural effusion. No pneumothorax. No other definite focal air space disease. Overall, appearance is similar to previous. Osseous structures are unchanged. IMPRESSION: 1. Moderate right with trace left pleural effusion with associated bibasilar atelectatic changes. 2. Approximate 4 cm right suprahilar opacity, consistent with patient's known right upper lobe mass. 3. Underlying COPD. 4. Mild cardiomegaly with aortic atherosclerosis. Electronically Signed   By: Jeannine Boga M.D.   On: 08/18/2017 22:55   Dg Abd 2 Views  Result Date: 08/19/2017 CLINICAL DATA:  Chronic constipation. EXAM: ABDOMEN - 2 VIEW COMPARISON:  PET/CT performed 08/18/2017 FINDINGS: A small right pleural effusion is again noted. No free intra-abdominal air is seen on the provided decubitus view. Residual contrast is seen within the colon. The colon is partially filled with stool, without significant radiographic evidence for constipation. Mild degenerative change is noted at the lower lumbar spine. IMPRESSION: 1. Small to moderate amount of stool noted in the colon. No radiographic evidence for constipation. No free intra-abdominal air seen. 2. Small right pleural effusion noted. Electronically Signed   By: Garald Balding M.D.   On: 08/19/2017 00:41   US Thoracentesis Asp Pleural Space  W/img Guide  Result Date: 08/19/2017 INDICATION: Patient with history of right lung mass with recurrent malignant right pleural effusion, dyspnea; request received for diagnostic and therapeutic right thoracentesis. EXAM: ULTRASOUND GUIDED DIAGNOSTIC AND THERAPEUTIC RIGHT THORACENTESIS MEDICATIONS: None COMPLICATIONS: None immediate. PROCEDURE: An ultrasound guided thoracentesis was thoroughly discussed with the patient and questions answered. The benefits, risks, alternatives and complications were also discussed. The patient understands and wishes to proceed with the procedure. Written consent was obtained. Ultrasound was performed to localize and mark an adequate pocket of fluid in the right chest. The area was then prepped and draped in the normal sterile fashion. 1% Lidocaine was used for local anesthesia. Under ultrasound guidance a 6 Fr Safe-T-Centesis catheter was introduced. Thoracentesis was performed. The catheter was removed and a dressing applied. FINDINGS: A total of approximately 1 liter of slightly hazy, yellow fluid was removed. Samples  were sent to the laboratory as requested by the clinical team. IMPRESSION: Successful ultrasound guided diagnostic and therapeutic right thoracentesis yielding 1 liter of pleural fluid. Follow-up chest x-ray revealed no pneumothorax. Read by: Rowe Robert, PA-C Electronically Signed   By: Lucrezia Europe M.D.   On: 08/19/2017 10:34        Scheduled Meds: . aspirin EC  81 mg Oral QHS  . atenolol  50 mg Oral Daily  . citalopram  20 mg Oral Daily  . enoxaparin (LOVENOX) injection  40 mg Subcutaneous Q24H  . levothyroxine  125 mcg Oral QAC breakfast  . pantoprazole  40 mg Oral Daily  . simvastatin  40 mg Oral q1800  . sodium chloride flush  3 mL Intravenous Q12H   Continuous Infusions:    LOS: 0 days     Cordelia Poche, MD [facility] 08/20/2017, 10:40 AM Pager: [phone]  If 7PM-7AM, please contact  night-coverage [email or web address] Password [email or web address] 08/20/2017, 10:40 AM

## 2017-08-20 NOTE — Progress Notes (Signed)
Subjective: The patient is seen and examined today.  Several family members were at the bedside including his wife, 2 daughters as well as grandchildren.  The patient is still confused.  There was no fever or chills.  He has no nausea, vomiting, diarrhea or constipation.  He continues to have a lot of pain especially in the back.  He had several studies performed recently including a PET scan as well as MRI of the brain in addition to ultrasound-guided right thoracentesis.  The cytology from the pleural fluid was consistent with adenocarcinoma.  PD-L1 extubation was performed and it was 99%.  Objective: Vital signs in last 24 hours: Temp:  [98.5 F (36.9 C)] 98.5 F (36.9 C) (04/18 0458) Pulse Rate:  [67-78] 67 (04/18 1442) Resp:  [20-22] 22 (04/18 1442) BP: (155-178)/(69-76) 178/76 (04/18 1613) SpO2:  [94 %] 94 % (04/18 0458) Weight:  [146 lb 9.7 oz (66.5 kg)] 146 lb 9.7 oz (66.5 kg) (04/18 0500)  Intake/Output from previous day: 04/17 0701 - 04/18 0700 In: 633.3 [I.V.:633.3] Out: 1150 [Urine:1150] Intake/Output this shift: Total I/O In: 794.2 [I.V.:244.2; IV Piggyback:550] Out: -   General appearance: cachectic, distracted, fatigued and mild distress Resp: diminished breath sounds RLL, dullness to percussion RLL and wheezes bilaterally Cardio: regular rate and rhythm, S1, S2 normal, no murmur, click, rub or gallop GI: soft, non-tender; bowel sounds normal; no masses,  no organomegaly Extremities: extremities normal, atraumatic, no cyanosis or edema uncooperative.  Lab Results:  Recent Labs    08/18/17 2241 08/18/17 2312 08/19/17 1442 08/20/17 0544  WBC 36.4*  --   --  30.7*  HGB 11.3* 13.3  --  11.3*  HCT 35.7* 39.0 35.4* 35.4*  PLT 645*  --   --  417*   BMET Recent Labs    08/18/17 2241 08/18/17 2312  08/19/17 1442 08/20/17 0544  NA 134* 132*  --   --  139  K 5.7* 5.6*   < > 4.5 4.2  CL 95* 96*  --   --  101  CO2 26  --   --   --  24  GLUCOSE 105* 108*  --   --   105*  BUN 25* 35*  --   --  20  CREATININE 1.45* 1.30*  --   --  0.96  CALCIUM 11.0*  --   --   --  11.2*   < > = values in this interval not displayed.    Studies/Results: Dg Chest 1 View  Result Date: 08/19/2017 CLINICAL DATA:  Post right thoracentesis EXAM: CHEST  1 VIEW COMPARISON:  08/18/2017 FINDINGS: Decreasing right pleural effusion following thoracentesis. No pneumothorax. Improving aeration in the lung bases with decreasing bibasilar atelectasis. Right upper lobe mass again noted. Heart is upper limits normal in size. IMPRESSION: Decreasing right effusion following thoracentesis. No visible pneumothorax. Electronically Signed   By: Rolm Baptise M.D.   On: 08/19/2017 10:25   Ct Head Wo Contrast  Result Date: 08/19/2017 CLINICAL DATA:  84 y/o M; unexplained altered level of consciousness. EXAM: CT HEAD WITHOUT CONTRAST TECHNIQUE: Contiguous axial images were obtained from the base of the skull through the vertex without intravenous contrast. COMPARISON:  None. FINDINGS: Brain: No evidence of acute infarction, hemorrhage, hydrocephalus, extra-axial collection or mass lesion/mass effect. Moderate diffuse brain parenchymal volume loss and mild chronic microvascular ischemic changes. Vascular: Calcific atherosclerosis of carotid siphons. No hyperdense vessel identified Skull: Normal. Negative for fracture or focal lesion. Sinuses/Orbits: No acute finding. Other: None.  IMPRESSION: 1. No acute intracranial abnormality identified. 2. Mild chronic microvascular ischemic changes and moderate parenchymal volume loss of the brain. Electronically Signed   By: Kristine Garbe M.D.   On: 08/19/2017 16:47   Dg Chest Port 1 View  Result Date: 08/18/2017 CLINICAL DATA:  Initial evaluation for acute shortness of breath. EXAM: PORTABLE CHEST 1 VIEW COMPARISON:  Comparison made with prior PET-CT from earlier the same day. FINDINGS: Mild cardiomegaly, stable. Mediastinal silhouette within normal  limits. Aortic atherosclerosis. Lungs hypoinflated. Veiling opacity overlying the right lung consistent with pleural effusion. Underlying changes related to COPD. Focal approximate 4 cm opacity within the right suprahilar region consistent with patient's known right upper lobe mass. Superimposed bibasilar atelectatic changes. Trace left pleural effusion. No pneumothorax. No other definite focal air space disease. Overall, appearance is similar to previous. Osseous structures are unchanged. IMPRESSION: 1. Moderate right with trace left pleural effusion with associated bibasilar atelectatic changes. 2. Approximate 4 cm right suprahilar opacity, consistent with patient's known right upper lobe mass. 3. Underlying COPD. 4. Mild cardiomegaly with aortic atherosclerosis. Electronically Signed   By: Jeannine Boga M.D.   On: 08/18/2017 22:55   Dg Abd 2 Views  Result Date: 08/19/2017 CLINICAL DATA:  Chronic constipation. EXAM: ABDOMEN - 2 VIEW COMPARISON:  PET/CT performed 08/18/2017 FINDINGS: A small right pleural effusion is again noted. No free intra-abdominal air is seen on the provided decubitus view. Residual contrast is seen within the colon. The colon is partially filled with stool, without significant radiographic evidence for constipation. Mild degenerative change is noted at the lower lumbar spine. IMPRESSION: 1. Small to moderate amount of stool noted in the colon. No radiographic evidence for constipation. No free intra-abdominal air seen. 2. Small right pleural effusion noted. Electronically Signed   By: Garald Balding M.D.   On: 08/19/2017 00:41   US Thoracentesis Asp Pleural Space W/img Guide  Result Date: 08/19/2017 INDICATION: Patient with history of right lung mass with recurrent malignant right pleural effusion, dyspnea; request received for diagnostic and therapeutic right thoracentesis. EXAM: ULTRASOUND GUIDED DIAGNOSTIC AND THERAPEUTIC RIGHT THORACENTESIS MEDICATIONS: None COMPLICATIONS:  None immediate. PROCEDURE: An ultrasound guided thoracentesis was thoroughly discussed with the patient and questions answered. The benefits, risks, alternatives and complications were also discussed. The patient understands and wishes to proceed with the procedure. Written consent was obtained. Ultrasound was performed to localize and mark an adequate pocket of fluid in the right chest. The area was then prepped and draped in the normal sterile fashion. 1% Lidocaine was used for local anesthesia. Under ultrasound guidance a 6 Fr Safe-T-Centesis catheter was introduced. Thoracentesis was performed. The catheter was removed and a dressing applied. FINDINGS: A total of approximately 1 liter of slightly hazy, yellow fluid was removed. Samples were sent to the laboratory as requested by the clinical team. IMPRESSION: Successful ultrasound guided diagnostic and therapeutic right thoracentesis yielding 1 liter of pleural fluid. Follow-up chest x-ray revealed no pneumothorax. Read by: Rowe Robert, PA-C Electronically Signed   By: Lucrezia Europe M.D.   On: 08/19/2017 10:34    Medications: I have reviewed the patient's current medications.  CODE STATUS: No CODE BLUE  Assessment/Plan: This is a very pleasant 84 years old white male recently diagnosed with stage IV non-small cell lung cancer, adenocarcinoma with extensive disease involving the right lower lobe, mediastinal lymphadenopathy, pleural space, right pleural effusion, bone, liver, adrenal gland diagnosed in April 2019. Unfortunately the patient has very poor performance status. I had a lengthy discussion  with the family today about his current condition and treatment options. His PDL 1 expression is 99% and he could have been a good candidate for treatment with immunotherapy pertubation is in terminal condition. I reviewed the imaging studies and showed the images to the family.  I also gives him copy of his pathology report and imaging studies. I strongly  recommended for the family to consider palliative care and hospice at this point. The patient's wishes is to die at home. The family would like to take the patient home with hospice service.  They may also consider a hospice facility if the unable to take care of him at home. For pain management, continue with the current medication. The family would like him to be discharged home as soon as possible with the hospice service.  I will try to reach out with the hospitalist so he can arrange for the discharge to be done by tomorrow. The family knows to call my office if he needs any assistance or if you have any other questions. Thank you for taking good care of Mr. Shiraishi.  I will continue to follow observation with you on assist in his management on as-needed basis.  LOS: 0 days    Eilleen Kempf 08/20/2017

## 2017-08-20 NOTE — Procedures (Signed)
Date of recording 08/20/2017  Referring physician Dr. Teryl Lucy  Reason for the study 84 year old male with altered mental status  Technical Digital EEG recording using 10-20 electrode international system  Description of the recording posterior dominant rhythm is between 6-7 Hz symmetric and reactive. Non-REM stage II sleep was not obtained  Epileptiform features were not seen during this recording.  Impression  Impression The EEG is abnormal and findings are consistent with mild generalized cerebral dysfunction.  Epileptiform features were not seen during this recording

## 2017-08-21 ENCOUNTER — Encounter (HOSPITAL_COMMUNITY): Payer: Self-pay | Admitting: Internal Medicine

## 2017-08-21 ENCOUNTER — Ambulatory Visit: Payer: Self-pay | Admitting: Oncology

## 2017-08-21 DIAGNOSIS — D649 Anemia, unspecified: Secondary | ICD-10-CM

## 2017-08-21 LAB — GLUCOSE, CAPILLARY: Glucose-Capillary: 115 mg/dL — ABNORMAL HIGH (ref 65–99)

## 2017-08-21 LAB — PATHOLOGIST SMEAR REVIEW

## 2017-08-21 MED ORDER — MORPHINE SULFATE (CONCENTRATE) 10 MG/0.5ML PO SOLN: 5.0000 mg | ORAL | Status: DC | PRN

## 2017-08-21 MED ORDER — LORAZEPAM 1 MG PO TABS: 1.0000 mg | ORAL_TABLET | ORAL | Status: DC | PRN

## 2017-08-21 MED ORDER — LORAZEPAM 2 MG/ML IJ SOLN: 1.0000 mg | INTRAMUSCULAR | Status: DC | PRN

## 2017-08-21 MED ORDER — LORAZEPAM 2 MG/ML PO CONC
1.0000 mg | ORAL | Status: DC | PRN
  Administered 2017-08-21: 1 mg via SUBLINGUAL
  Filled 2017-08-21 (×2): qty 0.5

## 2017-08-21 MED ORDER — OXYCODONE HCL 20 MG/ML PO CONC
5.0000 mg | ORAL | Status: DC | PRN
  Filled 2017-08-21: qty 1

## 2017-08-21 MED ORDER — OXYCODONE HCL 20 MG/ML PO CONC
5.0000 mg | ORAL | Status: DC | PRN
  Administered 2017-08-21: 5 mg via ORAL

## 2017-08-21 NOTE — Discharge Instructions (Signed)
Hospice Introduction Hospice is a service that is designed to provide people who are terminally ill and their families with medical, spiritual, and psychological support. Its aim is to improve your quality of life by keeping you as alert and comfortable as possible. Who will be my providers when I begin hospice care? Hospice teams often include:  A nurse.  A doctor. The hospice doctor will be available for your care, but you can bring your regular doctor or nurse practitioner.  Social workers.  Religious leaders (such as a Clinical biochemist).  Trained volunteers.  What roles will providers play in my care? Hospice is performed by a team of health care professionals and volunteers who:  Help keep you comfortable: ? Hospice can be provided in your home or in a homelike setting. ? The hospice staff works with your family and friends to help meet your needs. ? You will enjoy the support of loved ones by receiving much of your basic care from family and friends.  Provide pain relief and manage your symptoms. The staff supply all necessary medicines and equipment.  Provide companionship when you are alone.  Allow you and your family to rest. They may do light housekeeping, prepare meals, and run errands.  Provide counseling. They will make sure your emotional, spiritual, and social needs and those of your family are being met.  Provide spiritual care: ? Spiritual care will be individualized to meet your needs and your family's needs. ? Spiritual care may involve:  Helping you look at what death means to you.  Helping you say goodbye to your family and friends.  Performing a specific religious ceremony or ritual.  When should hospice care begin? Most people who use hospice are believed to have fewer than 6 months to live.  Your family and health care providers can help you decide when hospice services should begin.  If your condition improves, you may discontinue the program.  What  should I consider before selecting a program? Most hospice programs are run by nonprofit, independent organizations. Some are affiliated with hospitals, nursing homes, or home health care agencies. Hospice programs can take place in the home or at a hospice center, hospital, or skilled nursing facility. When choosing a hospice program, ask the following questions:  What services are available to me?  What services will be offered to my loved ones?  How involved will my loved ones be?  How involved will my health care provider be?  Who makes up the hospice care team? How are they trained or screened?  How will my pain and symptoms be managed?  If my circumstances change, can the services be provided in a different setting, such as my home or in the hospital?  Is the program reviewed and licensed by the state or certified in some other way?  Where can I learn more about hospice? You can learn about existing hospice programs in your area from your health care providers. You can also read more about hospice online. The websites of the following organizations contain helpful information:  [facility] and Palliative Care Organization [facility]).  The Hospice Association of America ([place]).  The [place].  The American Cancer Society (ACS).  [employer].  This information is not intended to replace advice given to you by your health care provider. Make sure you discuss any questions you have with your health care provider. Document Released: 08/08/2003 Document Revised: 12/06/2015 Document Reviewed: 03/01/2013 Elsevier Interactive Patient Education  2017 [employer].

## 2017-08-21 NOTE — Progress Notes (Signed)
Report given to [place] at [facility]. No questions or concerns at this time. Patient is being transferred via [place].

## 2017-08-21 NOTE — Discharge Summary (Addendum)
Physician Discharge Summary  Peter Velasquez YQM:[id number] DOB: 1934/01/28 DOA: 08/18/2017  PCP: Peter Low, MD  Admit date: 08/18/2017 Discharge date: 08/21/2017  Admitted From: Home Disposition: Hospice  Recommendations for Outpatient Follow-up:  1. Hospice care  Discharge Condition: Hospice CODE STATUS: DNR Diet recommendation: Comfort feeds   Brief/Interim Summary:  Admission HPI written by Vianne Bulls, MD   Chief Complaint: Respiratory distress, confusion   HPI: Peter Velasquez is a 84 y.o. male with medical history significant for hypothyroidism, depression, and recent diagnosis of right lung mass with diffuse osseous metastases and right pleural fluid cytology diagnostic of non-small cell carcinoma, now presenting to the emergency department for evaluation of respiratory distress and intermittent confusion.  Patient's family has noted that he has been short of breath with labored breathing.  They note increased work of breathing with minimal exertion.  They also report that the patient has been confused intermittently over the past several days, and they attribute this to a recent increase in his long-acting morphine.  There was no fall or trauma reported.  Patient does not use supplemental oxygen normally.  ED Course: Upon arrival to the ED, patient is found to be afebrile, saturating 88% on room air while at rest, and vitals otherwise stable.  EKG features a sinus rhythm and chest x-ray is notable for moderate right and trace left pleural effusion, right upper lobe mass, and underlying COPD changes.  Chemistry panel features a sodium of 134, potassium 5.7, and creatinine of 1.45, up from an apparent baseline of roughly 1.2.  CBC is notable for leukocytosis to 36,400, stable normocytic anemia with hemoglobin of 11.3, and a new thrombocytosis with platelets 645,000.  Troponin is undetectable and urinalysis is unremarkable.  Patient was given a liter of normal saline in the  ED.  He remains hemodynamically stable and will be observed on the telemetry unit for ongoing evaluation and management of acute hypoxic respiratory failure suspected secondary to recurrent right pleural effusion, likely malignant.    Hospital course:  Respiratory distress Acute respiratory failure with hypoxia Thought secondary to pleural effusion. S/p thoracentesis with 1 liter output. Weaned down slightly, but still requiring oxygen. Patient made comfort care.  Acute encephalopathy Possibly toxic secondary to opiates; patient has recent increase in dosage as an outpatient.. Ammonia within normal limits. TSH slightly elevated. CT head unremarkable for acute process. Worsened while inpatient. EEG unremarkable for seizure. ABG remarkable for hypoxia. Blood/urine cultures no growth to date. Patient started on empiric vancomycin and cefepime which was discontinued upon transition to comfort care.  Malignant pleural effusion Non-small cell carcinoma Patient sees Dr. Julien Velasquez as an outpatient. Discussion between oncologist and family and decision to make comfort care. With patient's significantly decreased oral intake, seems hospice appropriate as would place prognosis at less than 2 weeks.  Depression Anxiety Continued Celexa while inpatient.  Hyperkalemia Resolved.  CKD stage III Stable.  Thrombocytosis Likely reactive. Improved.  Leukocytosis Afebrile. Likely infectious source. Cultures obtained. Empiric antibiotics started but discontinued secondary to transition to comfort measures.  Hyperbilirubinemia New. Has a history of cholelithiasis. Resolved.  Hypothyroidism TSH slightly elevated. Continued home synthroid dose.   Essential hypertension On atenolol as an outpatient.. Mildly uncontrolled. Treated with atenolol while inpatient.  Severe malnutrition  Discharge Diagnoses:  Principal Problem:   Malignant pleural effusion Active Problems:   GERD   MDD (major  depressive disorder), recurrent severe, without psychosis ([place])   Normocytic normochromic anemia   Bone metastases ([place])  CKD (chronic kidney disease), stage III ([facility])   Hyperkalemia   Mass of right lung   Acute respiratory failure with hypoxia ([facility])   Acute encephalopathy   Hyperbilirubinemia   Thrombocytosis ([facility])   Respiratory distress    Discharge Instructions   Allergies as of 08/21/2017      Reactions   Morphine And Related Other (See Comments)   Hallucinations, spaced out, acting strangely, labored breathing      Medication List    STOP taking these medications   aspirin EC 81 MG tablet   atenolol 50 MG tablet Commonly known as:  TENORMIN   citalopram 20 MG tablet Commonly known as:  CELEXA   morphine 15 MG 12 hr tablet Commonly known as:  MS CONTIN   omeprazole 20 MG capsule Commonly known as:  PRILOSEC   oxyCODONE-acetaminophen 5-325 MG tablet Commonly known as:  PERCOCET/ROXICET   simvastatin 40 MG tablet Commonly known as:  ZOCOR     TAKE these medications   levothyroxine 125 MCG tablet Commonly known as:  SYNTHROID, LEVOTHROID Take 1 tablet by mouth daily.       Allergies  Allergen Reactions  . Morphine And Related Other (See Comments)    Hallucinations, spaced out, acting strangely, labored breathing    Consultations:  None   Procedures/Studies: Ct Abdomen Pelvis Wo Contrast  Result Date: 08/11/2017 CLINICAL DATA:  Right-sided back pain after fall radiating to the groin. 15 pound weight loss in the past week. Patient found to have a lung mass with pulmonary nodule lytic lesions. EXAM: CT ABDOMEN AND PELVIS WITHOUT CONTRAST TECHNIQUE: Multidetector CT imaging of the abdomen and pelvis was performed following the standard protocol without IV contrast. COMPARISON:  Report from May 06, 2000. FINDINGS: Lower chest: Top-normal size heart without pericardial effusion or thickening. Bilateral pleural calcifications consistent with prior  asbestos exposure with trace right-sided pleural effusion and bibasilar atelectasis. Hepatobiliary: Unenhanced liver is unremarkable. Gallbladder is physiologically distended faint densities along the dependent aspect suspicious for tiny gallstones. No biliary dilatation. Pancreas: No ductal dilatation, mass or peripancreatic inflammation. Spleen: Normal size spleen without mass. Adrenals/Urinary Tract: Normal bilateral adrenal glands. Dominant cyst arising from the upper pole of the left kidney measuring 7.9 x 6.8 x 6.1 cm, anechoic in appearance and consistent with a simple cyst. Smaller interpolar 2 cm cyst as an exophytic 1.6 cm cyst are noted of the left kidney. Subtle hyperdensities within the renal collecting systems and pelves compatible with previous administration of IV contrast. No hydroureteronephrosis. Contrast noted within the urinary bladder without focal mural thickening or calculus. Stomach/Bowel: Enteric contrast is seen within the stomach. Normal small bowel rotation without obstruction. Contrast reaches the cecum and extends to the transverse colon. No acute bowel inflammation or obstruction. Normal appendix. Vascular/Lymphatic: Moderate aortoiliac atherosclerosis without aneurysm. No lymphadenopathy. Small mesenteric and portacaval lymph nodes without pathologic enlargement. Reproductive: Penile prosthetic reservoir noted in the right hemipelvis. Small fat containing inguinal canals bilaterally. No bowel herniation. Mildly enlarged prostate measuring 5.1 x 4.1 x 4.1 cm impressing upon the base of the bladder. Unremarkable seminal vesicles. Other: No free air nor free fluid. Musculoskeletal: Subtle osteolytic lesions of the thoracolumbar spine from approximately T11 through L5 vertebral bodies anteriorly with pathologic fracture of the L2 vertebral body involving the inferior endplate extending into a central osteolytic lesion, series 6, image 62. Small osteolytic focus along the superior  endplate of L2 is also noted on the same image. Lower lumbar degenerative facet arthropathy from L4 through S1.  Marked degenerative disc disease L5-S1. Mild bilateral foraminal encroachment from endplate spurring and facet hypertrophy at L5-S1. IMPRESSION: 1. Subtle osteolytic abnormalities involving the T11 through L5 vertebral bodies with pathologic appearing fracture involve the inferior endplate of L2 extending into a more centrally located osteolytic lesion of the vertebral body. No retropulsion is seen. 2. Lower lumbar degenerative facet arthropathy from L4 through S1. Mild foraminal encroachment L5-S1 bilaterally from endplate spurring and facet arthropathy. 3. Left-sided renal cysts. 4. Uncomplicated cholelithiasis. Electronically Signed   By: Ashley Royalty M.D.   On: 08/11/2017 20:23   Dg Chest 1 View  Result Date: 08/19/2017 CLINICAL DATA:  Post right thoracentesis EXAM: CHEST  1 VIEW COMPARISON:  08/18/2017 FINDINGS: Decreasing right pleural effusion following thoracentesis. No pneumothorax. Improving aeration in the lung bases with decreasing bibasilar atelectasis. Right upper lobe mass again noted. Heart is upper limits normal in size. IMPRESSION: Decreasing right effusion following thoracentesis. No visible pneumothorax. Electronically Signed   By: Rolm Baptise M.D.   On: 08/19/2017 10:25   Dg Chest 1 View  Result Date: 08/11/2017 CLINICAL DATA:  Status post RIGHT thoracentesis. EXAM: CHEST  1 VIEW COMPARISON:  CT chest August 10, 2017 and July 30, 2017 FINDINGS: Cardiomediastinal silhouette is normal. Calcified aortic knob. Trace RIGHT pleural effusion. RIGHT upper lobe mass again noted. No pneumothorax. Mild chronic interstitial changes. Soft tissue planes and included osseous structures are unchanged. IMPRESSION: Trace residual RIGHT pleural effusion.  No pneumothorax. RIGHT upper lobe spiculated mass better characterized on prior CT. Aortic Atherosclerosis (ICD10-I70.0). Electronically Signed    By: Elon Alas M.D.   On: 08/11/2017 17:58   Dg Ribs Bilateral W/chest  Result Date: 07/29/2017 CLINICAL DATA:  Posterior ribcage injury after a fall 1 week ago. Symptoms are greatest on the left. The patient also describes shortness of breath. EXAM: BILATERAL RIBS AND CHEST - 4+ VIEW COMPARISON:  Left rib series of January 29, 2015 FINDINGS: The lungs are well-expanded. There is new patchy increased density in the right upper lobe. There is a new small right pleural effusion. There is no pneumothorax. The left lung is well-expanded and clear. The heart and pulmonary vascularity are normal. There is calcification in the wall of the aortic arch. Right rib detail images reveal no acute displaced fracture. On the left there is a mildly displaced fracture of the lateral aspect of the seventh rib. No definite other rib fractures are observed. IMPRESSION: Abnormal appearance of the right upper lobe worrisome for malignancy though pneumonia could present in a similar fashion. There is also a small right pleural effusion. If the patient has symptoms of typical pneumonia, a follow-up chest x-ray in 2-3 weeks following antibiotic therapy would be recommended. If there are no typical pneumonia symptoms, chest CT scanning now is recommended. Mildly displaced fracture of the lateral aspect of the left seventh rib. No pneumothorax. Thoracic aortic atherosclerosis. These results will be called to the ordering clinician or representative by the Radiologist Assistant, and communication documented in the PACS or zVision Dashboard. Electronically Signed   By: David  Martinique M.D.   On: 07/29/2017 16:48   Ct Head Wo Contrast  Result Date: 08/19/2017 CLINICAL DATA:  84 y/o M; unexplained altered level of consciousness. EXAM: CT HEAD WITHOUT CONTRAST TECHNIQUE: Contiguous axial images were obtained from the base of the skull through the vertex without intravenous contrast. COMPARISON:  None. FINDINGS: Brain: No evidence of  acute infarction, hemorrhage, hydrocephalus, extra-axial collection or mass lesion/mass effect. Moderate diffuse brain parenchymal volume  loss and mild chronic microvascular ischemic changes. Vascular: Calcific atherosclerosis of carotid siphons. No hyperdense vessel identified Skull: Normal. Negative for fracture or focal lesion. Sinuses/Orbits: No acute finding. Other: None. IMPRESSION: 1. No acute intracranial abnormality identified. 2. Mild chronic microvascular ischemic changes and moderate parenchymal volume loss of the brain. Electronically Signed   By: Kristine Garbe M.D.   On: 08/19/2017 16:47   Ct Chest W Contrast  Result Date: 08/10/2017 CLINICAL DATA:  84 year old male with multiple falls over the past 3 weeks. Right upper lobe pulmonary mass seen on recent CT. Patient scheduled for PET-CT. EXAM: CT CHEST WITH CONTRAST TECHNIQUE: Multidetector CT imaging of the chest was performed during intravenous contrast administration. CONTRAST:  58mL ISOVUE-300 IOPAMIDOL (ISOVUE-300) INJECTION 61% COMPARISON:  07/30/2017 CT FINDINGS: Cardiovascular: Normal size heart without pericardial effusion. There is coronary arteriosclerosis involving the left main, lad and RCA. And aortic atherosclerosis. No aneurysm or dissection. Suboptimal opacification of the pulmonary arteries for assessment of pulmonary embolus. Mediastinum/Nodes: No thyromegaly or discrete thyroid mass. Unremarkable CT appearance of the esophagus. No axillary adenopathy. Enlarged 14 mm right paratracheal short axis lymph node, 14 mm right hilar lymph node and 11 mm subcarinal lymph node. No significant progression since recent comparison. No left hilar adenopathy. Lungs/Pleura: Redemonstration of small to moderate right-sided pleural effusion with adjacent compressive atelectasis. Spiculated masslike abnormality in the posterior segment of right upper lobe measuring 4.3 x 2.4 cm is redemonstrated and unchanged. Subpleural areas of  interstitial fibrosis and mild peribronchial thickening is seen bilaterally. Upper Abdomen: Stable too small to further characterize right hepatic hypodensity measuring 5 mm. Water attenuating partially included left upper pole renal cyst measuring 7.4 cm. Musculoskeletal: Osseous metastasis noted with slightly expansile lytic lesion in the posterior left second rib, lateral left seventh rib with pathologic fracture, right eighth rib, lytic and sclerotic L2 vertebral lesion and lytic posterior T9 vertebral lesion. Additionally there is a lytic abnormality involving the inferior right scapula without pathologic fracture. IMPRESSION: 1. Unchanged 4.3 x 2.4 cm spiculated mass abutting the major fissure in the posterior segment of right upper lobe. Associated small to moderate right-sided pleural effusion with adjacent compressive atelectasis. 2. Mediastinal and right hilar adenopathy is stable. 3. Osseous metastasis with lytic abnormalities noted of the right scapula, right eighth rib in addition to previously noted left-sided second and seventh rib fractures as well as lytic abnormalities involving the posterior T9 and L2 vertebral bodies. No pathologic fracture on the right. Aortic Atherosclerosis (ICD10-I70.0). Electronically Signed   By: Ashley Royalty M.D.   On: 08/10/2017 22:01   Ct Chest W Contrast  Result Date: 07/30/2017 CLINICAL DATA:  Lung mass. Rib fracture after recent fall. Dyspnea. EXAM: CT CHEST WITH CONTRAST TECHNIQUE: Multidetector CT imaging of the chest was performed during intravenous contrast administration. Creatinine was obtained on site at [facility] at [address] Results: Creatinine 1.3 mg/dL. CONTRAST:  60mL ISOVUE-300 IOPAMIDOL (ISOVUE-300) INJECTION 61% COMPARISON:  Chest radiograph from one day prior. FINDINGS: Cardiovascular: Normal heart size. No significant pericardial fluid/thickening. Left main, left anterior descending and right coronary atherosclerosis.  Atherosclerotic nonaneurysmal thoracic aorta. Normal caliber pulmonary arteries. No central pulmonary emboli. Mediastinum/Nodes: No discrete thyroid nodules. Unremarkable esophagus. No axillary adenopathy. Enlarged 1.5 cm right paratracheal node (series 2/image 64). Enlarged 1.4 cm subcarinal node (series 2/image 79). Enlarged 1.6 cm right hilar node (series 2/image 74). No left hilar adenopathy. Lungs/Pleura: No pneumothorax. Small dependent right pleural effusion. No left pleural effusion. Mild centrilobular emphysema with mild  diffuse bronchial wall thickening. There is a spiculated 4.6 x 2.4 cm posterior right upper lobe lung mass (series 8/image 51), which abuts the superior aspect of the right major fissure and involves the visceral pleura, with possible extension into the superior segment right lower lobe (series 8/image 58). No additional lung masses. No significant pulmonary nodules. Patchy subpleural reticulation and ground-glass attenuation throughout both lungs without significant traction bronchiectasis or frank honeycombing. Upper abdomen: Subcentimeter hypodense peripheral right liver lobe lesion, too small to characterize (series 2/image 166). Partially visualized simple exophytic 7.6 cm upper left renal cyst. Musculoskeletal: There are mildly expansile aggressive appearing lytic lesions in the posterior left second rib and lateral left seventh rib, with associated pathologic lateral left seventh rib fracture. Faint lytic and sclerotic L2 vertebral lesion (series 6/image 104). Faint lytic posterior T9 vertebral lesion (series 6/image 106). Mild thoracic spondylosis. IMPRESSION: 1. Spiculated 4.6 cm posterior right upper lobe lung mass suspicious for primary bronchogenic carcinoma, possibly extending into the superior segment right lower lobe. 2. Small dependent right pleural effusion, cannot exclude malignant effusion. 3. Right hilar, subcarinal and right paratracheal lymphadenopathy suspicious for  nodal metastases. 4. Lytic osseous lesions in the left second and seventh ribs and T9 and L2 vertebral bodies, suspicious for osseous metastases. Associated pathologic lateral left seventh rib fracture. 5. Left main and two-vessel coronary atherosclerosis. 6. Nonspecific patchy subpleural reticulation and ground-glass attenuation throughout both lungs, cannot exclude an underlying interstitial lung disease such as nonspecific interstitial pneumonia (NSIP) or early usual interstitial pneumonia (UIP). Aortic Atherosclerosis (ICD10-I70.0) and Emphysema (ICD10-J43.9). Electronically Signed   By: Ilona Sorrel M.D.   On: 07/30/2017 11:39   Mr Jeri Cos ZO Contrast  Result Date: 08/11/2017 CLINICAL DATA:  Lung cancer staging EXAM: MRI HEAD WITHOUT AND WITH CONTRAST TECHNIQUE: Multiplanar, multiecho pulse sequences of the brain and surrounding structures were obtained without and with intravenous contrast. CONTRAST:  20mL MULTIHANCE GADOBENATE DIMEGLUMINE 529 MG/ML IV SOLN COMPARISON:  None. FINDINGS: BRAIN: The midline structures are normal. There is no acute infarct or acute hemorrhage. No mass lesion, hydrocephalus, dural abnormality or extra-axial collection. The brain parenchymal signal is normal for the patient's age. No age-advanced or lobar predominant atrophy. No chronic microhemorrhage or superficial siderosis. VASCULAR: Major intracranial arterial and venous sinus flow voids are preserved. SKULL AND UPPER CERVICAL SPINE: The visualized skull base, calvarium, upper cervical spine and extracranial soft tissues are normal. SINUSES/ORBITS: No fluid levels or advanced mucosal thickening. No mastoid or middle ear effusion. Normal orbits. IMPRESSION: Normal aging brain.  No intracranial metastatic disease. Electronically Signed   By: Ulyses Jarred M.D.   On: 08/11/2017 20:41   Nm Pet Image Initial (pi) Skull Base To Thigh  Result Date: 08/18/2017 CLINICAL DATA:  Initial treatment strategy for right upper lobe mass.  EXAM: NUCLEAR MEDICINE PET SKULL BASE TO THIGH TECHNIQUE: 7.9 mCi F-18 FDG was injected intravenously. Full-ring PET imaging was performed from the skull base to thigh after the radiotracer. CT data was obtained and used for attenuation correction and anatomic localization. Fasting blood glucose: 103 mg/dl COMPARISON:  CT abdomen/pelvis dated 08/11/2017. CT chest dated 08/10/2017. FINDINGS: Mediastinal blood pool activity: SUV max 2.2 NECK: No hypermetabolic cervical lymphadenopathy. Incidental CT findings: none CHEST: 4.3 cm posterior right upper lobe mass, max SUV 17.5, corresponding to suspected primary bronchogenic neoplasm. Additional pleural-based nodularity at the right lung apex, max SUV 6.2 (PET image 56). Thoracic nodal metastases, including: --1.2 cm short axis right paratracheal node (series 4/image 35), max SUV  20.9 --1.5 cm short axis right hilar node (series 4/image 39), max SUV 21.7 Moderate right pleural effusion. Associated right lower lobe compressive atelectasis. Trace left pleural effusion. Incidental CT findings: Atherosclerotic calcifications of the aortic arch. Coronary atherosclerosis of the LAD. ABDOMEN/PELVIS: At least 4 hepatic metastases, including: --Lesion in segment 4A (PET image 102), max SUV 11.9 --Lesion in segment 6 (PET image 115), max SUV 11.7 --Lesion in segment 3 (PET image 119), max SUV 5.9 12 mm left adrenal metastasis (series 4/image 130), max SUV 8.4. No hypermetabolic lymphadenopathy in the abdomen/pelvis. Incidental CT findings: 7.2 cm left upper pole renal cyst. Atherosclerotic calcifications the abdominal aorta and branch vessels. Prostatomegaly. Penile prosthesis. SKELETON: Widespread osseous metastases throughout the visualized axial and appendicular skeleton, including following representative lesions: --Lower cervical vertebral body, max SUV 42.4 --Left scapula, max SUV 19.1 --Lower thoracic vertebral body, max SUV 48.7 --Mid lumbar vertebral body, max SUV 26.8  --Left iliac bone, max SUV 36.5 --Right proximal femur, max SUV 29.8 Incidental CT findings: none IMPRESSION: 4.3 cm right upper lobe mass, corresponding to suspected primary bronchogenic neoplasm. Associated mediastinal and right hilar nodal metastases. Moderate right pleural effusion.  Trace left pleural effusion. Hepatic and left adrenal metastases. Widespread osseous metastases throughout the visualized axial and appendicular skeleton, including representative lesions above. Electronically Signed   By: Julian Hy M.D.   On: 08/18/2017 13:51   Dg Chest Port 1 View  Result Date: 08/18/2017 CLINICAL DATA:  Initial evaluation for acute shortness of breath. EXAM: PORTABLE CHEST 1 VIEW COMPARISON:  Comparison made with prior PET-CT from earlier the same day. FINDINGS: Mild cardiomegaly, stable. Mediastinal silhouette within normal limits. Aortic atherosclerosis. Lungs hypoinflated. Veiling opacity overlying the right lung consistent with pleural effusion. Underlying changes related to COPD. Focal approximate 4 cm opacity within the right suprahilar region consistent with patient's known right upper lobe mass. Superimposed bibasilar atelectatic changes. Trace left pleural effusion. No pneumothorax. No other definite focal air space disease. Overall, appearance is similar to previous. Osseous structures are unchanged. IMPRESSION: 1. Moderate right with trace left pleural effusion with associated bibasilar atelectatic changes. 2. Approximate 4 cm right suprahilar opacity, consistent with patient's known right upper lobe mass. 3. Underlying COPD. 4. Mild cardiomegaly with aortic atherosclerosis. Electronically Signed   By: Jeannine Boga M.D.   On: 08/18/2017 22:55   Dg Abd 2 Views  Result Date: 08/19/2017 CLINICAL DATA:  Chronic constipation. EXAM: ABDOMEN - 2 VIEW COMPARISON:  PET/CT performed 08/18/2017 FINDINGS: A small right pleural effusion is again noted. No free intra-abdominal air is seen on  the provided decubitus view. Residual contrast is seen within the colon. The colon is partially filled with stool, without significant radiographic evidence for constipation. Mild degenerative change is noted at the lower lumbar spine. IMPRESSION: 1. Small to moderate amount of stool noted in the colon. No radiographic evidence for constipation. No free intra-abdominal air seen. 2. Small right pleural effusion noted. Electronically Signed   By: Garald Balding M.D.   On: 08/19/2017 00:41   Dg Hip Unilat W Or Wo Pelvis 2-3 Views Right  Result Date: 08/10/2017 CLINICAL DATA:  Right hip pain after 2 falls today. EXAM: DG HIP (WITH OR WITHOUT PELVIS) 2-3V RIGHT COMPARISON:  None. FINDINGS: The cortical margins of the bony pelvis and right hip are intact. No evidence of fracture. Pubic symphysis and sacroiliac joints are congruent. Both femoral heads are well-seated in the respective acetabula. Mild degenerative change of both hips with acetabular spurring. Penile prosthesis  is incidentally noted. IMPRESSION: No fracture of the pelvis or right hip. Electronically Signed   By: Jeb Levering M.D.   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 EXAM: ULTRASOUND GUIDED DIAGNOSTIC AND THERAPEUTIC RIGHT THORACENTESIS MEDICATIONS: None COMPLICATIONS: None immediate. PROCEDURE: An ultrasound guided thoracentesis was thoroughly discussed with the patient and questions answered. The benefits, risks, alternatives and complications were also discussed. The patient understands and wishes to proceed with the procedure. Written consent was obtained. Ultrasound was performed to localize and mark an adequate pocket of fluid in the right chest. The area was then prepped and draped in the normal sterile  fashion. 1% Lidocaine was used for local anesthesia. Under ultrasound guidance a 6 Fr Safe-T-Centesis catheter was introduced. Thoracentesis was performed. The catheter was removed and a dressing applied. FINDINGS: A total of approximately 1 liter of slightly hazy, yellow fluid was removed. Samples were sent to the laboratory as requested by the clinical team. IMPRESSION: Successful ultrasound guided diagnostic and therapeutic right thoracentesis yielding 1 liter of pleural fluid. Follow-up chest x-ray revealed no pneumothorax. Read by: Rowe Robert, PA-C Electronically Signed   By: Lucrezia Europe M.D.   On: 08/19/2017 10:34   US Thoracentesis Asp Pleural Space W/img Guide  Result Date: 08/11/2017 INDICATION: Patient with history of tobacco abuse; now with right lung mass, dyspnea, hilar/mediastinal adenopathy and lytic bony lesions. Request made for diagnostic and therapeutic right thoracentesis. EXAM: ULTRASOUND GUIDED DIAGNOSTIC AND THERAPEUTIC RIGHT THORACENTESIS MEDICATIONS: None COMPLICATIONS: None immediate. PROCEDURE: An ultrasound guided thoracentesis was thoroughly discussed with the patient and questions answered. The benefits, risks, alternatives and complications were also discussed. The patient understands and wishes to proceed with the procedure. Written consent was obtained. Ultrasound was performed to localize and mark an adequate pocket of fluid in the right chest. The area was then prepped and draped in the normal sterile fashion. 1% Lidocaine was used for local anesthesia. Under ultrasound guidance a 6 Fr Safe-T-Centesis catheter was introduced. Thoracentesis was performed. The catheter was removed and a dressing applied. FINDINGS: A total of approximately 1.1 liters of slightly hazy, yellow fluid was removed. Samples were sent to the laboratory as requested by the clinical team. IMPRESSION: Successful ultrasound guided diagnostic and therapeutic right thoracentesis yielding 1.1 liters of pleural  fluid. Read by: Rowe Robert, PA-C Electronically Signed   By: Jacqulynn Cadet M.D.   On: 08/11/2017 17:19     Subjective: Agitated overnight.  Discharge Exam: Vitals:   08/20/17 1613 08/20/17 1800  BP: (!) 178/76 (!) 162/56  Pulse:    Resp:    Temp:    SpO2:     Vitals:   08/20/17 0500 08/20/17 1442 08/20/17 1613 08/20/17 1800  BP:  (!) 170/69 (!) 178/76 (!) 162/56  Pulse:  67    Resp:  (!) 22    Temp:      TempSrc:      SpO2:      Weight: 66.5 kg (146 lb 9.7 oz)     Height:        General: No distress   The results of significant diagnostics from this hospitalization (including imaging, microbiology, ancillary and laboratory) are listed below for reference.     Microbiology: Recent Results (from the past 240 hour(s))  Body fluid culture     Status: None (  Preliminary result)   Collection Time: 08/19/17  9:37 AM  Result Value Ref Range Status   Specimen Description   Final    PLEURAL RIGHT Performed at [place] [address], [place], [place] [address]    Special Requests   Final    NONE Performed at [facility], [place] [address] [place], [place] [address]    Gram Stain   Final    RARE WBC PRESENT, PREDOMINANTLY MONONUCLEAR NO ORGANISMS SEEN    Culture   Final    NO GROWTH 2 DAYS Performed at [facility], [place] [address] [place], [place] [address]    Report Status PENDING  Incomplete  Culture, Urine     Status: None   Collection Time: 08/19/17  1:37 PM  Result Value Ref Range Status   Specimen Description   Final    URINE, RANDOM Performed at [facility] [address], [place], [place] [address]    Special Requests   Final    NONE Performed at [facility], [place] [address], [place], [place] [address]    Culture   Final    NO GROWTH Performed at [facility], [place] [address] [place], [place] [address]    Report Status 08/20/2017 FINAL  Final    Culture, blood (routine x 2)     Status: None (Preliminary result)   Collection Time: 08/19/17  2:34 PM  Result Value Ref Range Status   Specimen Description   Final    BLOOD RIGHT ARM Performed at [facility] [address], [place], [place] [address]    Special Requests   Final    AEROBIC BOTTLE ONLY Blood Culture adequate volume Performed at [place] [address], [place], [place] [address]    Culture   Final    NO GROWTH < 24 HOURS Performed at [place] [address] [place], [place] [address]    Report Status PENDING  Incomplete  Culture, blood (routine x 2)     Status: None (Preliminary result)   Collection Time: 08/19/17  2:43 PM  Result Value Ref Range Status   Specimen Description   Final    BLOOD RIGHT ARM Performed at [facility] [address], [place], [place] [address]    Special Requests   Final    AEROBIC BOTTLE ONLY Blood Culture adequate volume Performed at [place] [address], [place], [place] [address]    Culture   Final    NO GROWTH < 24 HOURS Performed at [facility] [address] [place], [place] [address]    Report Status PENDING  Incomplete  MRSA PCR Screening     Status: None   Collection Time: 08/20/17 11:21 AM  Result Value Ref Range Status   MRSA by PCR NEGATIVE NEGATIVE Final    Comment:        The GeneXpert MRSA Assay (FDA approved for NASAL specimens only), is one component of a comprehensive MRSA colonization surveillance program. It is not intended to diagnose MRSA infection nor to guide or monitor treatment for MRSA infections. Performed at [facility], [place] [address] [place],  [address]      Labs: BNP (last 3 results) No results for input(s): BNP in the last 8760 hours. Basic Metabolic Panel: Recent Labs  Lab 08/18/17 2241 08/18/17 2312 08/19/17 0434 08/19/17 1442  08/20/17  0544  NA 134* 132*  --   --  139  K 5.7* 5.6* 4.9 4.5 4.2  CL 95* 96*  --   --  101  CO2 26  --   --   --  24  GLUCOSE 105* 108*  --   --  105*  BUN 25* 35*  --   --  20  CREATININE 1.45* 1.30*  --   --  0.96  CALCIUM 11.0*  --   --   --  11.2*   Liver Function Tests: Recent Labs  Lab 08/18/17 2241 08/19/17 1442 08/20/17 0544  AST 34  --  23  ALT 23  --  21  ALKPHOS 148*  --  129*  BILITOT 2.1* 0.5 1.0  PROT 6.6  --  6.7  ALBUMIN 2.7*  --  2.6*   No results for input(s): LIPASE, AMYLASE in the last 168 hours. Recent Labs  Lab 08/19/17 0108  AMMONIA <9*   CBC: Recent Labs  Lab 08/18/17 2241 08/18/17 2312 08/19/17 1442 08/20/17 0544  WBC 36.4*  --   --  30.7*  NEUTROABS 32.0*  --   --  26.8*  HGB 11.3* 13.3  --  11.3*  HCT 35.7* 39.0 35.4* 35.4*  MCV 90.4  --   --  89.8  PLT 645*  --   --  417*   Cardiac Enzymes: No results for input(s): CKTOTAL, CKMB, CKMBINDEX, TROPONINI in the last 168 hours. BNP: Invalid input(s): POCBNP CBG: Recent Labs  Lab 08/18/17 0749 08/20/17 0756 08/21/17 0731  GLUCAP 103* 99 115*   D-Dimer No results for input(s): DDIMER in the last 72 hours. Hgb A1c No results for input(s): HGBA1C in the last 72 hours. Lipid Profile No results for input(s): CHOL, HDL, LDLCALC, TRIG, CHOLHDL, LDLDIRECT in the last 72 hours. Thyroid function studies Recent Labs    08/19/17 1442  TSH 5.161*   Anemia work up Recent Labs    08/19/17 1442  VITAMINB12 1,483*   Urinalysis    Component Value Date/Time   COLORURINE YELLOW 08/19/2017 0320   APPEARANCEUR CLEAR 08/19/2017 0320   LABSPEC 1.024 08/19/2017 0320   PHURINE 5.0 08/19/2017 0320   GLUCOSEU NEGATIVE 08/19/2017 0320   HGBUR NEGATIVE 08/19/2017 0320   BILIRUBINUR NEGATIVE 08/19/2017 0320   KETONESUR 5 (A) 08/19/2017 0320   PROTEINUR NEGATIVE 08/19/2017 0320   NITRITE NEGATIVE 08/19/2017 0320   LEUKOCYTESUR NEGATIVE 08/19/2017 0320   Sepsis Labs Invalid input(s):  PROCALCITONIN,  WBC,  LACTICIDVEN Microbiology Recent Results (from the past 240 hour(s))  Body fluid culture     Status: None (Preliminary result)   Collection Time: 08/19/17  9:37 AM  Result Value Ref Range Status   Specimen Description   Final    PLEURAL RIGHT Performed at [facility], [place] [address], [place], [place] [address]    Special Requests   Final    NONE Performed at [facility], [place] [address], [place], [place] [address]    Gram Stain   Final    RARE WBC PRESENT, PREDOMINANTLY MONONUCLEAR NO ORGANISMS SEEN    Culture   Final    NO GROWTH 2 DAYS Performed at [facility], [place] [address], [place], [place] [address]    Report Status PENDING  Incomplete  Culture, Urine     Status: None   Collection Time: 08/19/17  1:37 PM  Result Value Ref Range Status   Specimen Description   Final  URINE, RANDOM Performed at [facility], [place] [address], [place], [place] [address]    Special Requests   Final    NONE Performed at [facility], [place] [address], [place], [place] [address]    Culture   Final    NO GROWTH Performed at [facility], [place] [address] [place], [place] [address]    Report Status 08/20/2017 FINAL  Final  Culture, blood (routine x 2)     Status: None (Preliminary result)   Collection Time: 08/19/17  2:34 PM  Result Value Ref Range Status   Specimen Description   Final    BLOOD RIGHT ARM Performed at [place] [place] [address], [place], [place] [address]    Special Requests   Final    AEROBIC BOTTLE ONLY Blood Culture adequate volume Performed at [place] [address] [place], [place] [address]    Culture   Final    NO GROWTH < 24 HOURS Performed at [facility] [address], [place], [place] [address]    Report Status PENDING  Incomplete  Culture, blood (routine x 2)     Status: None (Preliminary  result)   Collection Time: 08/19/17  2:43 PM  Result Value Ref Range Status   Specimen Description   Final    BLOOD RIGHT ARM Performed at [place] [address] [place], [place] [address]    Special Requests   Final    AEROBIC BOTTLE ONLY Blood Culture adequate volume Performed at [place] [address], [place], [place] [address]    Culture   Final    NO GROWTH < 24 HOURS Performed at [facility] [address], [place], [place] [address]    Report Status PENDING  Incomplete  MRSA PCR Screening     Status: None   Collection Time: 08/20/17 11:21 AM  Result Value Ref Range Status   MRSA by PCR NEGATIVE NEGATIVE Final    Comment:        The GeneXpert MRSA Assay (FDA approved for NASAL specimens only), is one component of a comprehensive MRSA colonization surveillance program. It is not intended to diagnose MRSA infection nor to guide or monitor treatment for MRSA infections. Performed at [facility], [place] [address], [place],  [address]      SIGNED:   Cordelia Poche, MD [facility] 08/21/2017, 10:36 AM Pager [phone]  If 7PM-7AM, please contact night-coverage [email or web address] Password [email or web address]

## 2017-08-21 NOTE — Progress Notes (Signed)
SLP Cancellation Note  Patient Details Name: Peter Velasquez MRN: [id number] DOB: 07-Mar-1934   Cancelled treatment:       Reason Eval/Treat Not Completed: Pt has transitioned to full comfort and is in the process of being transported to [facility].   Juan Quam [place] 08/21/2017, 1:10 PM

## 2017-08-21 NOTE — Clinical Social Work Note (Signed)
Clinical Social Work Assessment  Patient Details  Name: Peter Velasquez MRN: [id number] Date of Birth: [birth year]  Date of referral:                  Reason for consult:  Facility Placement                Permission sought to share information with:  Case Manager, Customer service manager, Family Supports Permission granted to share information::  No(Patietn not oriented)  Name::     Romie Minus and [place]::  Hospice facilities  Relationship::  Spouse and daughter  Contact Information:     Housing/Transportation Living arrangements for the past 2 months:  Single Family Home Source of Information:  Adult Children Patient Interpreter Needed:  None Criminal Activity/Legal Involvement Pertinent to Current Situation/Hospitalization:  No - Comment as needed Significant Relationships:  Adult Children, Spouse Lives with:  Spouse Do you feel safe going back to the place where you live?  Yes Need for family participation in patient care:  Yes (Comment)  Care giving concerns:  No care giving concerns at the time.    Social Worker assessment / plan:  LCSW consulted for residential hospice placement.   Patient admitted for respiratory distress and confusion.   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  LCSW spoke to patient's daughter [name] by phone. Tri confirmed the families decision to move to comfort care and wishes for residential hospice.   LCSw explained hospice referral and process. Teri expressed understanding.   PLAN: Patient will go to residential hospice at dc.    Employment status:  Retired Forensic scientist:  Medicare PT Recommendations:  Not assessed at this time Information / Referral to community resources:     Patient/Family's Response to care:  Family is thankful for call and  resources.   Patient/Family's Understanding of and Emotional Response to Diagnosis, Current Treatment, and Prognosis:  Patient's family express understanding of patients condition. Patient's family agreeable to residential hospice.   Emotional Assessment Appearance:  Appears stated age Attitude/Demeanor/Rapport:  Unable to Assess Affect (typically observed):  Agitated Orientation:  Oriented to Self Alcohol / Substance use:  Not Applicable Psych involvement (Current and /or in the community):  No (Comment)  Discharge Needs  Concerns to be addressed:  No discharge needs identified Readmission within the last 30 days:  Yes Current discharge risk:  None Barriers to Discharge:  No Barriers Identified   Servando Snare, LCSW 08/21/2017, 9:40 AM

## 2017-08-21 NOTE — Progress Notes (Signed)
LCSW following for residential hospice placement.  Patient has bed at [facility]. LCSW confirmed bed with facility.   Patient will transport by [facility].   LCSW faxed dc docs to facility.  RN report #: [facility], Shawna Clamp [place] [phone]

## 2017-08-21 NOTE — Progress Notes (Deleted)
CCMD notified RN that patient had a 8 beat run of V[place]. MD notified. Patient in no acute distress and is working with physical therapy at the time.

## 2017-08-21 NOTE — Telephone Encounter (Signed)
I did see him and spoke to the family.

## 2017-08-22 LAB — BODY FLUID CULTURE: CULTURE: NO GROWTH

## 2017-08-24 LAB — CULTURE, BLOOD (ROUTINE X 2)
CULTURE: NO GROWTH
Culture: NO GROWTH
SPECIAL REQUESTS: ADEQUATE
Special Requests: ADEQUATE

## 2017-08-26 ENCOUNTER — Inpatient Hospital Stay: Payer: Medicare Other | Admitting: Internal Medicine

## 2017-09-02 DEATH — deceased

## 2017-09-03 ENCOUNTER — Encounter (HOSPITAL_COMMUNITY): Payer: Self-pay | Admitting: Internal Medicine

## 2019-05-24 IMAGING — DX DG CHEST 1V
1 series · 1 of 1 positions shown · non-contrast
Comparison: CT chest August 10, 2017 and July 30, 2017

CLINICAL DATA: Status post RIGHT thoracentesis.

EXAM:
CHEST  1 VIEW

[chest ap]
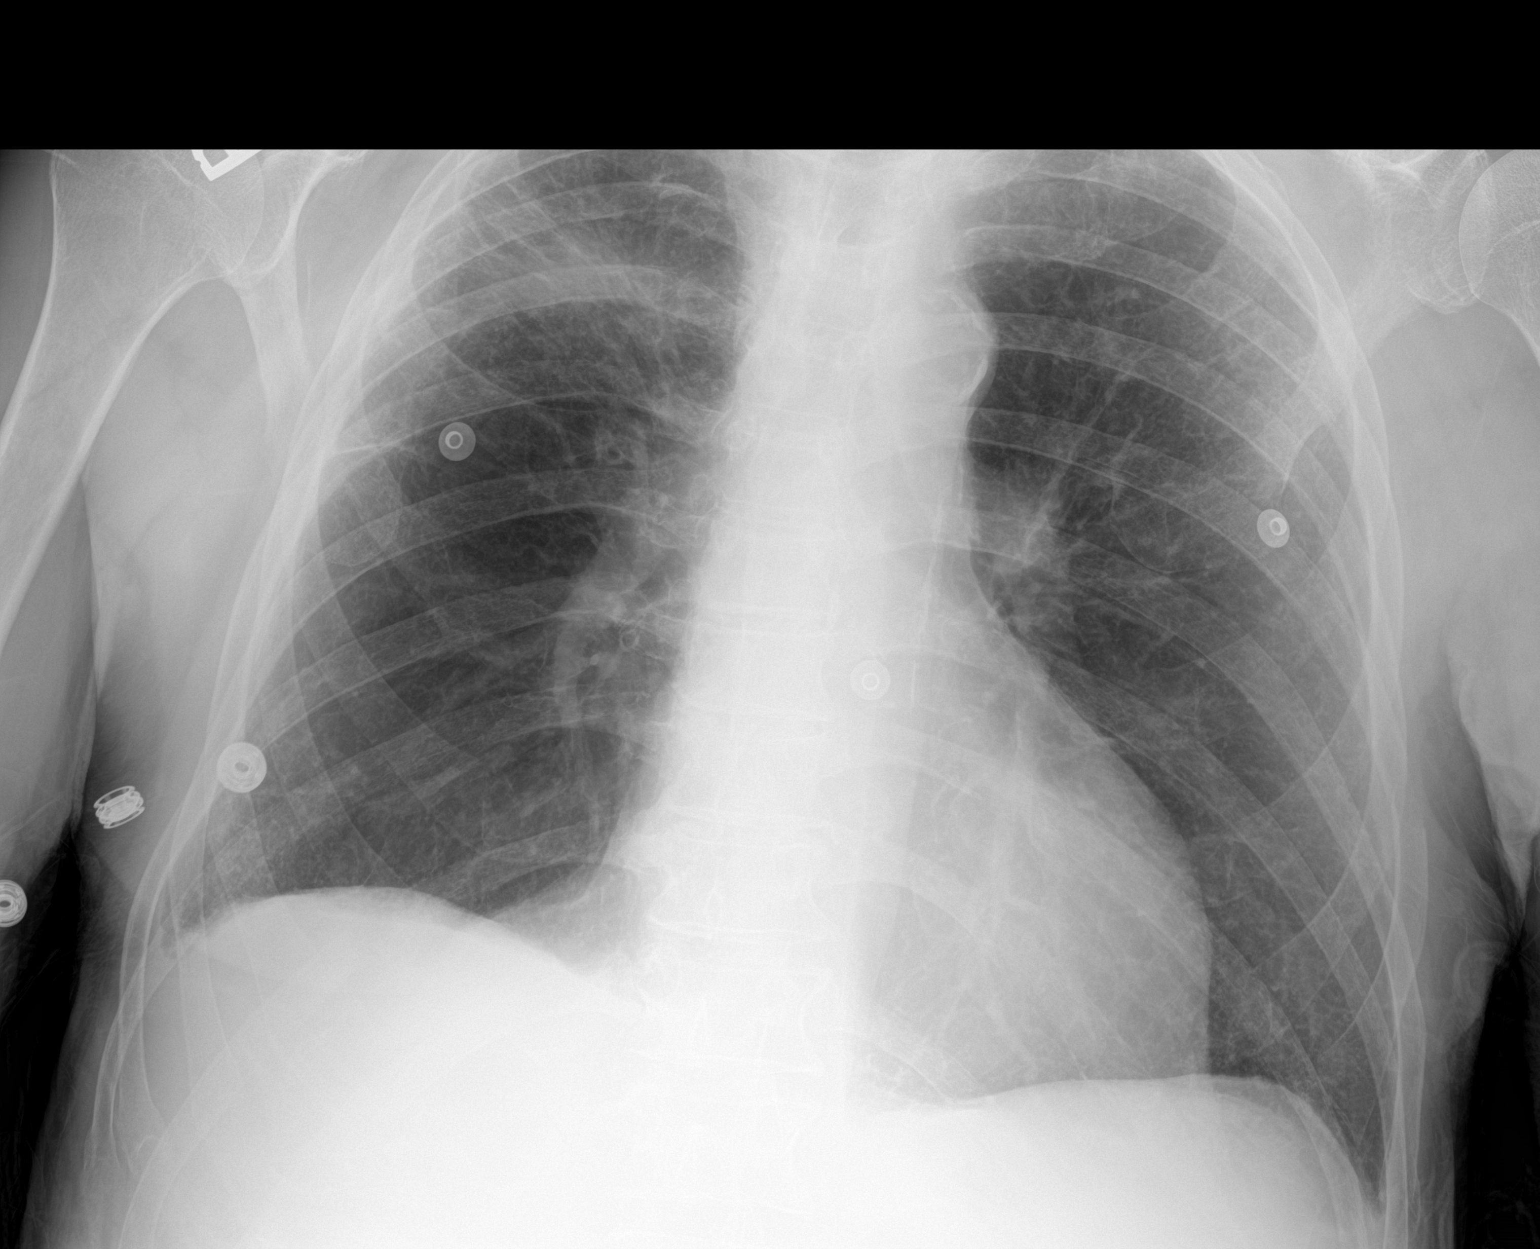

[1 of 1 positions shown; findings below may reference images not displayed]

FINDINGS: Cardiomediastinal silhouette is normal. Calcified aortic knob. Trace
RIGHT pleural effusion. RIGHT upper lobe mass again noted. No
pneumothorax. Mild chronic interstitial changes. Soft tissue planes
and included osseous structures are unchanged.
IMPRESSION: Trace residual RIGHT pleural effusion.  No pneumothorax.

RIGHT upper lobe spiculated mass better characterized on prior CT.

Aortic Atherosclerosis ([id number]).

## 2019-06-01 IMAGING — CT CT HEAD W/O CM
3 series · 15 of 46 positions shown, 18 images · non-contrast
Comparison: None.

CLINICAL DATA: 83 y/o M; unexplained altered level of
consciousness.

EXAM:
CT HEAD WITHOUT CONTRAST
TECHNIQUE: Contiguous axial images were obtained from the base of the skull
through the vertex without intravenous contrast.

[Series 2: head wo · axial · 0.47mm/px · z∈[-59,+61]mm · 9 of 29 slices shown, 12 images]
[im 3/29  brain]
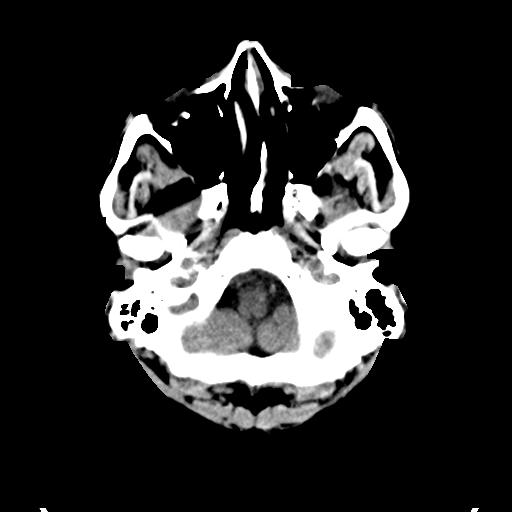
[im 3/29  bone]
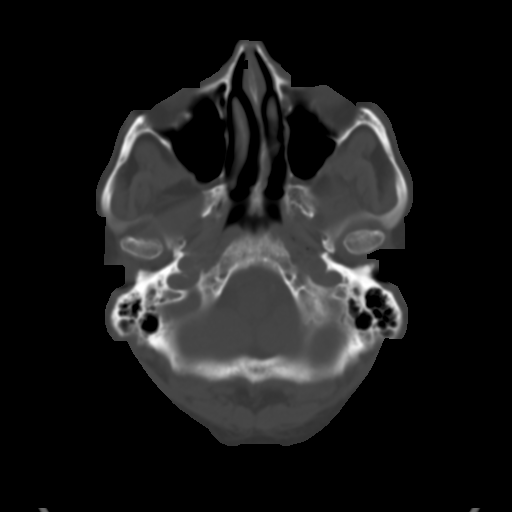
[im 6/29  brain]
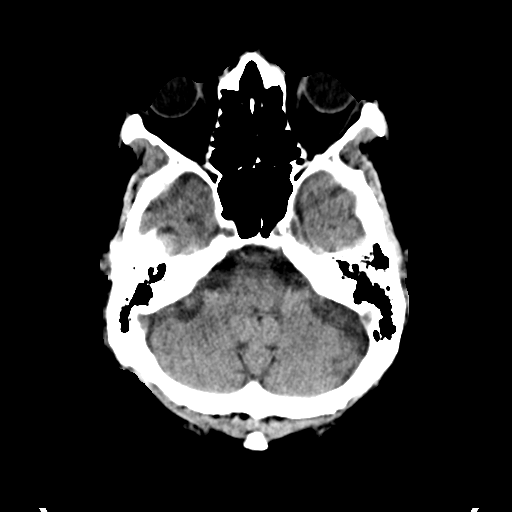
[im 9/29  brain]
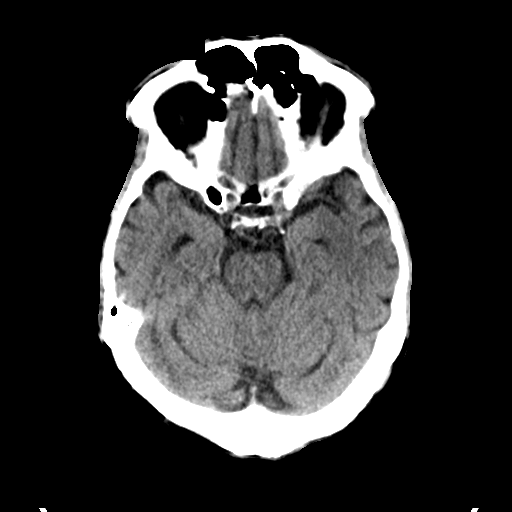
[im 12/29  brain]
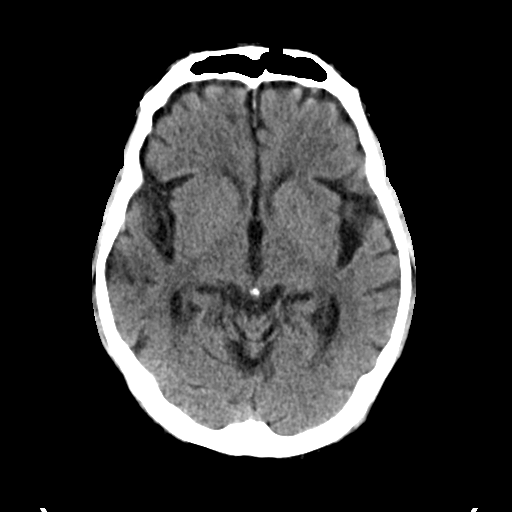
[im 15/29  brain]
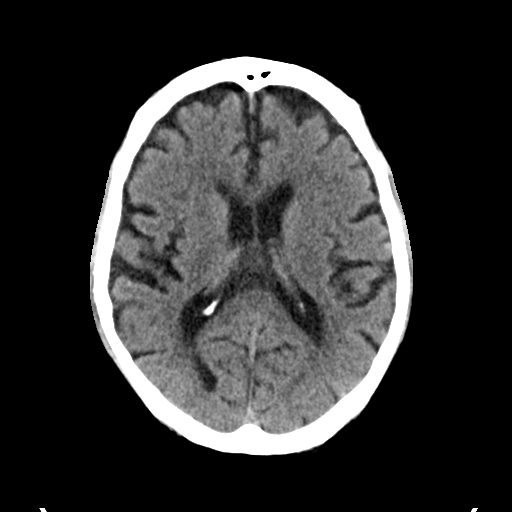
[im 15/29  bone]
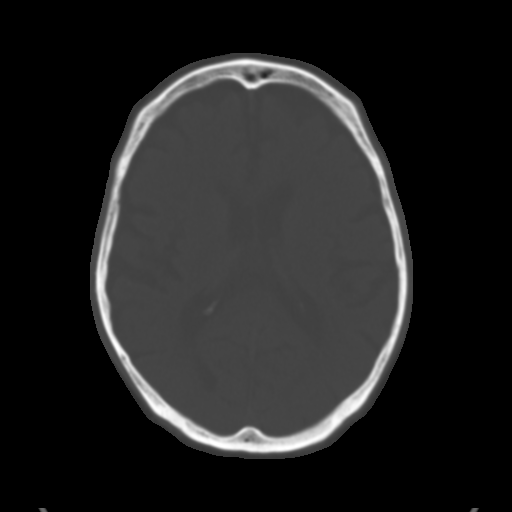
[im 18/29  brain]
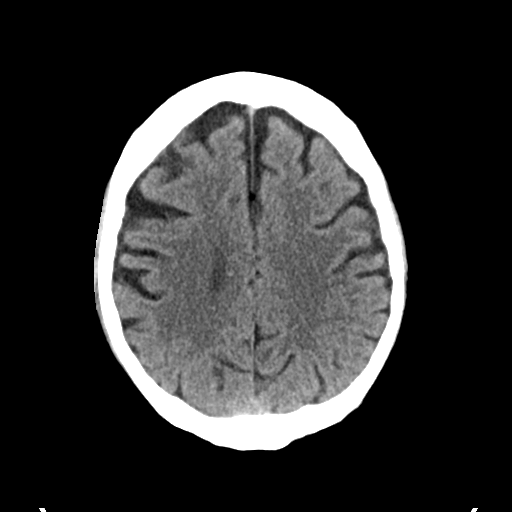
[im 21/29  brain]
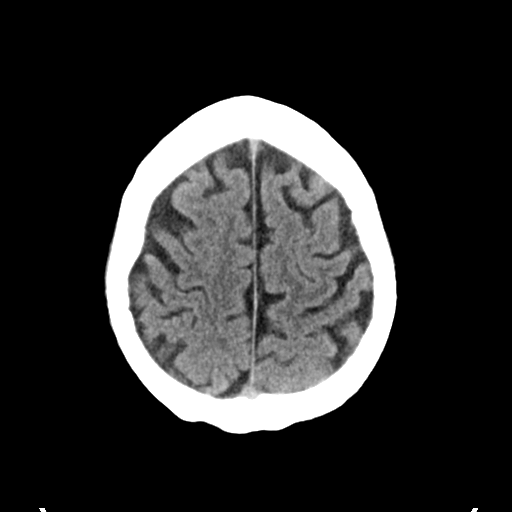
[im 24/29  brain]
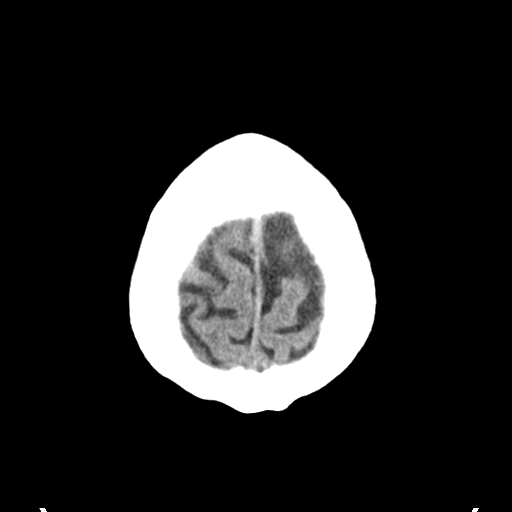
[im 27/29  brain]
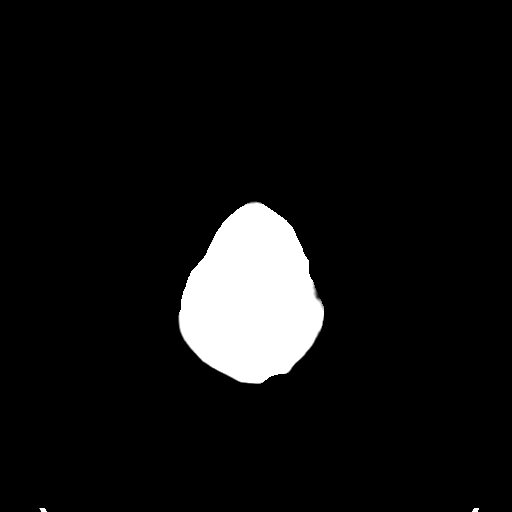
[im 27/29  bone]
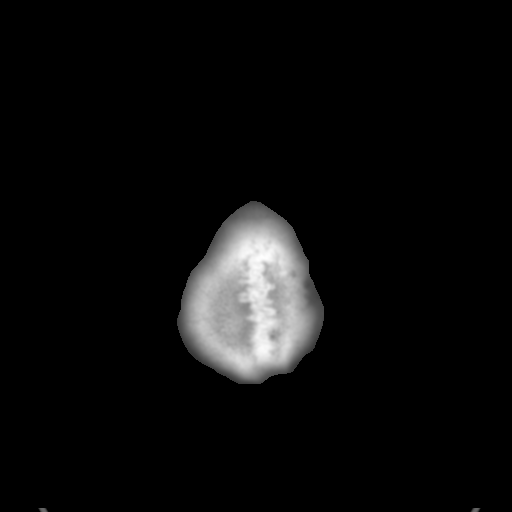

[Series 4: coronal soft tissue · coronal · 0.35mm/px · 3 of 66 slices shown]
[im 22/66  brain]
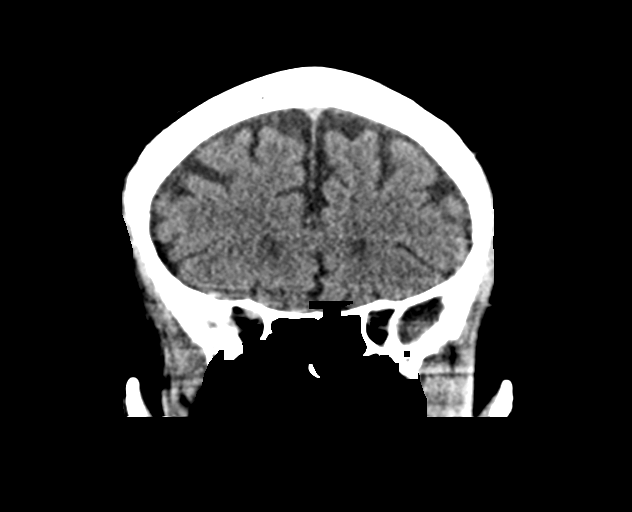
[im 29/66  brain]
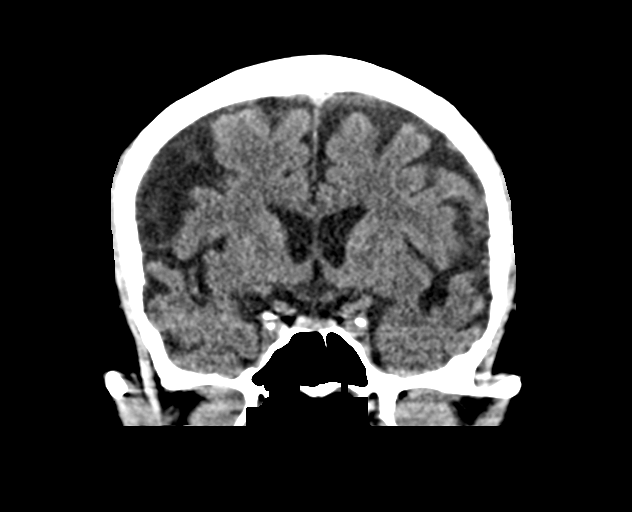
[im 37/66  brain]
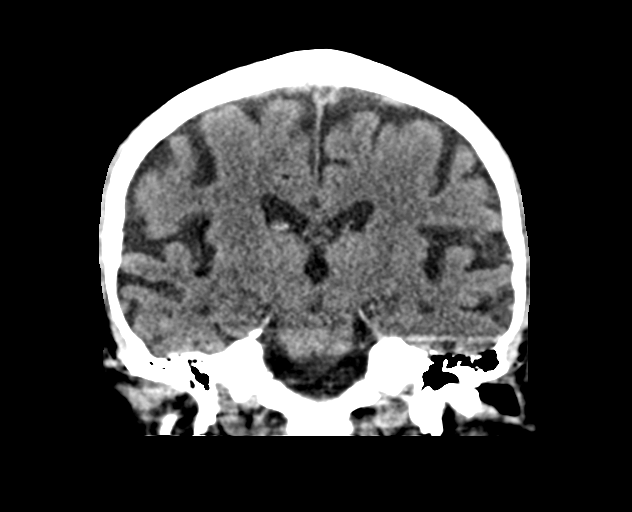

[Series 5: sagittal soft tissue · sagittal · 0.32mm/px · 3 of 54 slices shown]
[im 18/54  brain]
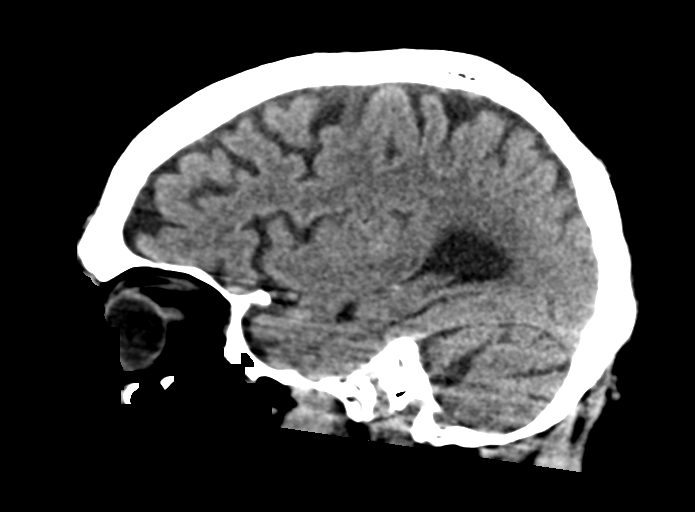
[im 27/54  brain]
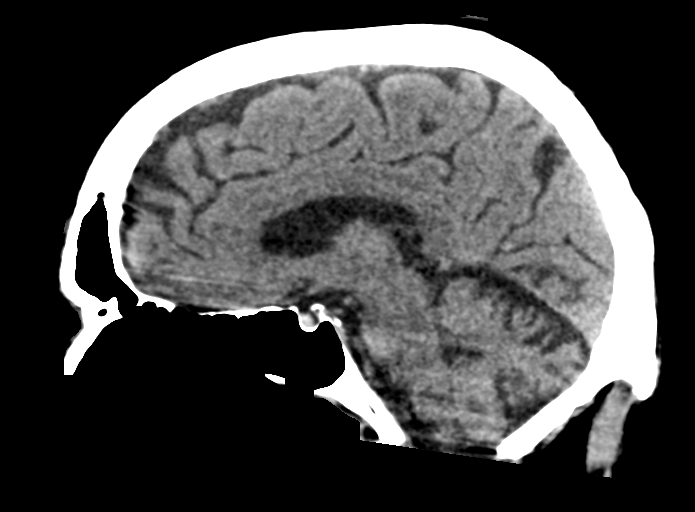
[im 36/54  brain]
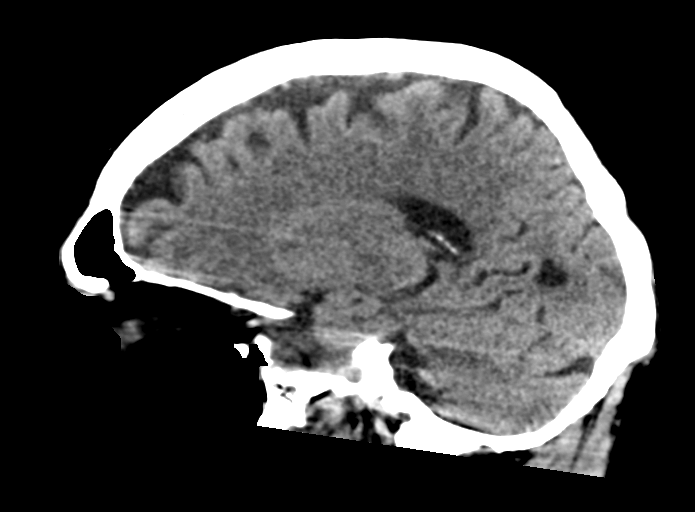

[15 of 46 positions shown; findings below may reference images not displayed]

FINDINGS: Brain: No evidence of acute infarction, hemorrhage, hydrocephalus,
extra-axial collection or mass lesion/mass effect. Moderate diffuse
brain parenchymal volume loss and mild chronic microvascular
ischemic changes.

Vascular: Calcific atherosclerosis of carotid siphons. No hyperdense
vessel identified

Skull: Normal. Negative for fracture or focal lesion.

Sinuses/Orbits: No acute finding.

Other: None.
IMPRESSION: 1. No acute intracranial abnormality identified.
2. Mild chronic microvascular ischemic changes and moderate
parenchymal volume loss of the brain.

By: Jalia August M.D.

## 2019-06-01 IMAGING — DX DG CHEST 1V
1 series · 1 of 1 positions shown · non-contrast
Comparison: 08/18/2017

CLINICAL DATA: Post right thoracentesis

EXAM:
CHEST  1 VIEW

[chest pa]
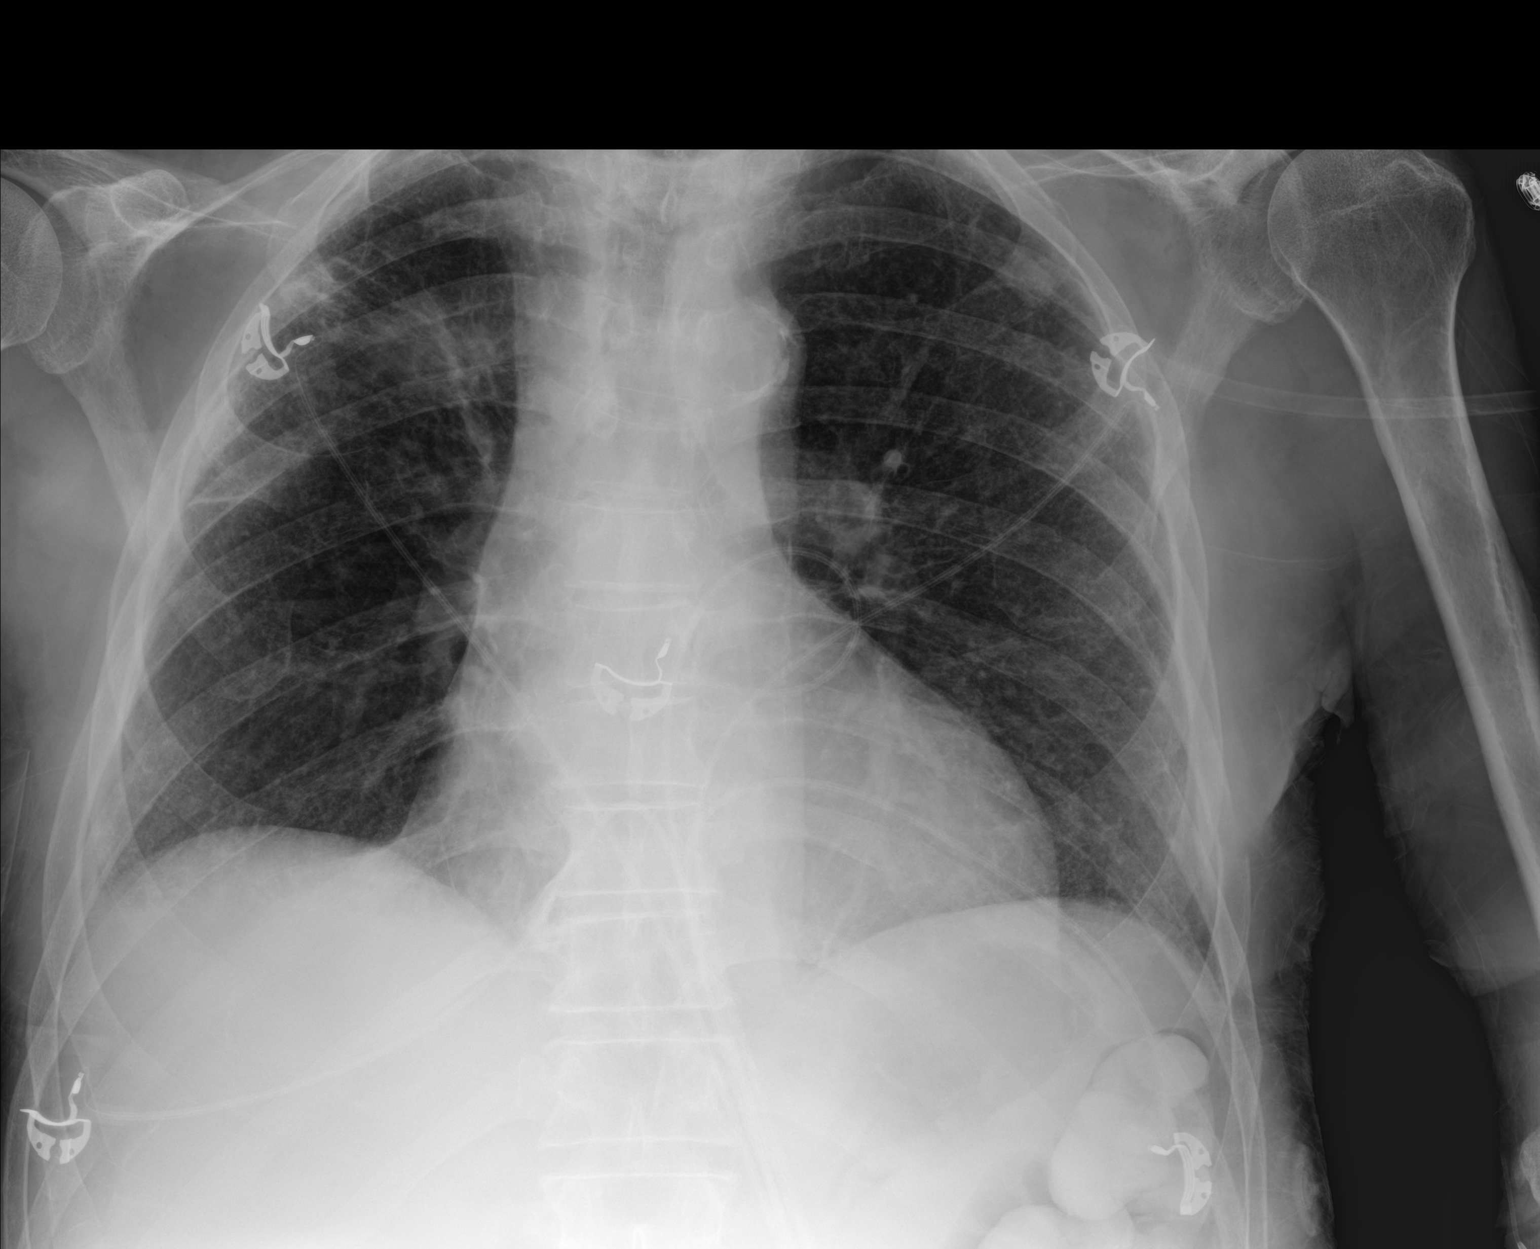

[1 of 1 positions shown; findings below may reference images not displayed]

FINDINGS: Decreasing right pleural effusion following thoracentesis. No
pneumothorax. Improving aeration in the lung bases with decreasing
bibasilar atelectasis. Right upper lobe mass again noted. Heart is
upper limits normal in size.
IMPRESSION: Decreasing right effusion following thoracentesis. No visible
pneumothorax.
